# Patient Record
Sex: Female | Born: 1935
Health system: Southern US, Community
[De-identification: ages and names within clinical notes are randomized; demographics above are authoritative.]

## PROBLEM LIST (undated history)

## (undated) DIAGNOSIS — E785 Hyperlipidemia, unspecified: Secondary | ICD-10-CM

## (undated) DIAGNOSIS — K279 Peptic ulcer, site unspecified, unspecified as acute or chronic, without hemorrhage or perforation: Secondary | ICD-10-CM

## (undated) DIAGNOSIS — R911 Solitary pulmonary nodule: Secondary | ICD-10-CM

## (undated) DIAGNOSIS — I351 Nonrheumatic aortic (valve) insufficiency: Secondary | ICD-10-CM

## (undated) DIAGNOSIS — M25569 Pain in unspecified knee: Secondary | ICD-10-CM

## (undated) DIAGNOSIS — M810 Age-related osteoporosis without current pathological fracture: Secondary | ICD-10-CM

## (undated) DIAGNOSIS — R55 Syncope and collapse: Secondary | ICD-10-CM

## (undated) DIAGNOSIS — I2510 Atherosclerotic heart disease of native coronary artery without angina pectoris: Secondary | ICD-10-CM

## (undated) DIAGNOSIS — I340 Nonrheumatic mitral (valve) insufficiency: Secondary | ICD-10-CM

## (undated) DIAGNOSIS — S92009A Unspecified fracture of unspecified calcaneus, initial encounter for closed fracture: Secondary | ICD-10-CM

## (undated) DIAGNOSIS — I309 Acute pericarditis, unspecified: Principal | ICD-10-CM

## (undated) DIAGNOSIS — I480 Paroxysmal atrial fibrillation: Secondary | ICD-10-CM

## (undated) DIAGNOSIS — I071 Rheumatic tricuspid insufficiency: Secondary | ICD-10-CM

## (undated) DIAGNOSIS — I10 Essential (primary) hypertension: Secondary | ICD-10-CM

## (undated) DIAGNOSIS — K219 Gastro-esophageal reflux disease without esophagitis: Secondary | ICD-10-CM

## (undated) HISTORY — DX: Peptic ulcer, site unspecified, unspecified as acute or chronic, without hemorrhage or perforation: K27.9

## (undated) HISTORY — DX: Rheumatic tricuspid insufficiency: I07.1

## (undated) HISTORY — DX: Syncope and collapse: R55

## (undated) HISTORY — DX: Age-related osteoporosis without current pathological fracture: M81.0

## (undated) HISTORY — DX: Atherosclerotic heart disease of native coronary artery without angina pectoris: I25.10

## (undated) HISTORY — PX: OTHER SURGICAL HISTORY: SHX169

## (undated) HISTORY — DX: Paroxysmal atrial fibrillation: I48.0

## (undated) HISTORY — DX: Essential (primary) hypertension: I10

## (undated) HISTORY — DX: Pain in unspecified knee: M25.569

## (undated) HISTORY — PX: DILATION AND CURETTAGE OF UTERUS: SHX78

## (undated) HISTORY — DX: Solitary pulmonary nodule: R91.1

## (undated) HISTORY — DX: Gastro-esophageal reflux disease without esophagitis: K21.9

## (undated) HISTORY — PX: ORIF HIP FRACTURE: SHX2125

## (undated) HISTORY — DX: Acute pericarditis, unspecified: I30.9

## (undated) HISTORY — PX: CHOLECYSTECTOMY: SHX55

## (undated) HISTORY — PX: TONSILLECTOMY: SHX5217

## (undated) HISTORY — DX: Unspecified fracture of unspecified calcaneus, initial encounter for closed fracture: S92.009A

## (undated) HISTORY — DX: Hyperlipidemia, unspecified: E78.5

## (undated) HISTORY — DX: Nonrheumatic mitral (valve) insufficiency: I34.0

## (undated) HISTORY — DX: Nonrheumatic aortic (valve) insufficiency: I35.1

---

## 1998-01-20 LAB — HM MAMMOGRAPHY

## 2002-11-03 ENCOUNTER — Other Ambulatory Visit: Admission: RE | Admit: 2002-11-03 | Discharge: 2002-11-03 | Payer: Self-pay | Admitting: Internal Medicine

## 2002-12-09 LAB — HM COLONOSCOPY: HM Colonoscopy: NORMAL

## 2007-03-17 ENCOUNTER — Inpatient Hospital Stay (HOSPITAL_COMMUNITY): Admission: EM | Admit: 2007-03-17 | Discharge: 2007-03-24 | Payer: Self-pay | Admitting: Emergency Medicine

## 2007-03-17 ENCOUNTER — Ambulatory Visit: Payer: Self-pay | Admitting: Internal Medicine

## 2007-05-01 ENCOUNTER — Encounter: Payer: Self-pay | Admitting: Internal Medicine

## 2007-05-01 ENCOUNTER — Ambulatory Visit: Payer: Self-pay | Admitting: Internal Medicine

## 2007-05-02 DIAGNOSIS — M810 Age-related osteoporosis without current pathological fracture: Secondary | ICD-10-CM | POA: Insufficient documentation

## 2007-05-02 DIAGNOSIS — K279 Peptic ulcer, site unspecified, unspecified as acute or chronic, without hemorrhage or perforation: Secondary | ICD-10-CM | POA: Insufficient documentation

## 2007-05-02 DIAGNOSIS — I10 Essential (primary) hypertension: Secondary | ICD-10-CM | POA: Insufficient documentation

## 2007-05-02 HISTORY — DX: Peptic ulcer, site unspecified, unspecified as acute or chronic, without hemorrhage or perforation: K27.9

## 2007-05-02 HISTORY — DX: Age-related osteoporosis without current pathological fracture: M81.0

## 2007-05-08 ENCOUNTER — Telehealth: Payer: Self-pay | Admitting: Internal Medicine

## 2007-07-30 ENCOUNTER — Telehealth: Payer: Self-pay | Admitting: Internal Medicine

## 2007-08-12 ENCOUNTER — Telehealth: Payer: Self-pay | Admitting: Internal Medicine

## 2007-11-04 ENCOUNTER — Encounter: Payer: Self-pay | Admitting: *Deleted

## 2007-11-04 DIAGNOSIS — S92009A Unspecified fracture of unspecified calcaneus, initial encounter for closed fracture: Secondary | ICD-10-CM

## 2007-11-04 DIAGNOSIS — Z9089 Acquired absence of other organs: Secondary | ICD-10-CM | POA: Insufficient documentation

## 2007-11-04 DIAGNOSIS — Z8719 Personal history of other diseases of the digestive system: Secondary | ICD-10-CM | POA: Insufficient documentation

## 2007-11-04 DIAGNOSIS — Z9889 Other specified postprocedural states: Secondary | ICD-10-CM | POA: Insufficient documentation

## 2007-11-04 HISTORY — DX: Unspecified fracture of unspecified calcaneus, initial encounter for closed fracture: S92.009A

## 2007-11-05 ENCOUNTER — Ambulatory Visit: Payer: Self-pay | Admitting: Internal Medicine

## 2007-11-24 ENCOUNTER — Telehealth: Payer: Self-pay | Admitting: Internal Medicine

## 2008-11-04 ENCOUNTER — Telehealth: Payer: Self-pay | Admitting: Internal Medicine

## 2010-03-19 ENCOUNTER — Ambulatory Visit: Payer: Self-pay | Admitting: Internal Medicine

## 2010-03-19 ENCOUNTER — Encounter: Payer: Self-pay | Admitting: Internal Medicine

## 2010-03-19 DIAGNOSIS — M25569 Pain in unspecified knee: Secondary | ICD-10-CM | POA: Insufficient documentation

## 2010-03-19 HISTORY — DX: Pain in unspecified knee: M25.569

## 2010-03-19 LAB — CONVERTED CEMR LAB
BUN: 16 mg/dL (ref 6–23)
Basophils Absolute: 0 10*3/uL (ref 0.0–0.1)
Basophils Relative: 0.4 % (ref 0.0–3.0)
CO2: 30 meq/L (ref 19–32)
Calcium: 9.4 mg/dL (ref 8.4–10.5)
Chloride: 99 meq/L (ref 96–112)
Cholesterol: 222 mg/dL — ABNORMAL HIGH (ref 0–200)
Creatinine, Ser: 0.7 mg/dL (ref 0.4–1.2)
Direct LDL: 115.1 mg/dL
Eosinophils Absolute: 0.1 10*3/uL (ref 0.0–0.7)
Eosinophils Relative: 1 % (ref 0.0–5.0)
GFR calc non Af Amer: 87.88 mL/min (ref >60)
Glucose, Bld: 91 mg/dL (ref 70–99)
HCT: 39.5 % (ref 36.0–46.0)
HDL: 91.9 mg/dL (ref >39.00)
Hemoglobin: 13.4 g/dL (ref 12.0–15.0)
Lymphocytes Relative: 16.4 % (ref 12.0–46.0)
Lymphs Abs: 1 10*3/uL (ref 0.7–4.0)
MCHC: 34 g/dL (ref 30.0–36.0)
MCV: 91.8 fL (ref 78.0–100.0)
Monocytes Absolute: 0.3 10*3/uL (ref 0.1–1.0)
Monocytes Relative: 5.7 % (ref 3.0–12.0)
Neutro Abs: 4.6 10*3/uL (ref 1.4–7.7)
Neutrophils Relative %: 76.5 % (ref 43.0–77.0)
Platelets: 254 10*3/uL (ref 150.0–400.0)
Potassium: 4.3 meq/L (ref 3.5–5.1)
RBC: 4.31 M/uL (ref 3.87–5.11)
RDW: 14.6 % (ref 11.5–14.6)
Sodium: 137 meq/L (ref 135–145)
TSH: 3.42 microintl units/mL (ref 0.35–5.50)
Total CHOL/HDL Ratio: 2
Triglycerides: 38 mg/dL (ref 0.0–149.0)
VLDL: 7.6 mg/dL (ref 0.0–40.0)
WBC: 6 10*3/uL (ref 4.5–10.5)

## 2010-04-10 ENCOUNTER — Telehealth: Payer: Self-pay | Admitting: Internal Medicine

## 2010-07-31 NOTE — Progress Notes (Signed)
Summary: Prilosec/Rommie Dunn pt  Phone Note Call from Patient Call back at Home Phone [phone]   Caller: Patient Call For: Dr Debby Bud Summary of Call: Patient lmovm stating that she was instruced per Addeline Calarco to try Prilosec and to call back for RX if it worked. Is this ok to fill? Initial call taken by: Rock Nephew CMA,  August 12, 2007 3:01 PM  Follow-up for Phone Call        yes ok for rx for omeprazole 20 mg #30 one by mouth once daily ac rf#5 Follow-up by: D. Thomos Lemons DO,  August 12, 2007 7:19 PM    New/Updated Medications: OMEPRAZOLE 20 MG CPDR (OMEPRAZOLE) Take 1 once daily   Prescriptions: OMEPRAZOLE 20 MG CPDR (OMEPRAZOLE) Take 1 once daily  #30 x 5   Entered by:   Lamar Sprinkles   Authorized by:   D. Thomos Lemons DO   Signed by:   Lamar Sprinkles on 08/13/2007   Method used:   Electronically sent to ...       [facility] # [phone]*       [address]       [place], [place]  [address]       Ph: [phone]       Fax: [phone]   RxID:   [id number]

## 2010-07-31 NOTE — Progress Notes (Signed)
Summary: RF's  Phone Note Call from Patient   Summary of Call: Patient is requesting written rx's for all her meds to pick up. OK to prepare these?   - Also wants apt for shingles vaccine.  Initial call taken by: Lamar Sprinkles, CMA,  April 10, 2010 11:40 AM  Follow-up for Phone Call        OK to refill Rx's.   OK for shingles vaccine (if covered). Follow-up by: Jacques Navy MD,  April 10, 2010 1:02 PM  Additional Follow-up for Phone Call Additional follow up Details #1::        left mess to call office back...........Marland KitchenLamar Sprinkles, CMA  April 10, 2010 3:23 PM   Spoke w/patient, she will bring the form with needed meds to office tomorrow.   Also advised that she call her insurance and check on zostavax coverage.  Additional Follow-up by: Lamar Sprinkles, CMA,  April 12, 2010 5:42 PM    Additional Follow-up for Phone Call Additional follow up Details #2::    Spoke w/patient. She needs refills sent to pharmacy. Follow-up by: Lamar Sprinkles, CMA,  April 18, 2010 1:56 PM  Prescriptions: ALLEGRA 60 MG TABS (FEXOFENADINE HCL) 1 once daily  #90 x 3   Entered by:   Lamar Sprinkles, CMA   Authorized by:   Jacques Navy MD   Signed by:   Lamar Sprinkles, CMA on 04/18/2010   Method used:   Electronically to        [facility] # [phone]* (retail)       [address]       [place], [place]  [address]       Ph: [phone]       Fax: [phone]   RxID:   [id number] PROTONIX 40 MG  TBEC (PANTOPRAZOLE SODIUM) 1 once daily  #90 x 1   Entered by:   Lamar Sprinkles, CMA   Authorized by:   Jacques Navy MD   Signed by:   Lamar Sprinkles, CMA on 04/18/2010   Method used:   Electronically to        [facility] # [id number]* (retail)       [address]       [place], [place]  [address]       Ph: [phone]       Fax: [phone]   RxID:   [id number] LOPRESSOR 50 MG  TABS (METOPROLOL TARTRATE) 1/2 two times a day  #90 x 1   Entered by:   Lamar Sprinkles,  CMA   Authorized by:   Jacques Navy MD   Signed by:   Lamar Sprinkles, CMA on 04/18/2010   Method used:   Electronically to        [facility] # [phone]* (retail)       [address]       [place], [place]  [address]       Ph: [phone]       Fax: [phone]   RxID:   [id number]

## 2010-07-31 NOTE — Assessment & Plan Note (Signed)
Summary: FU /$50 Cynthia Vargas   Vital Signs:  Patient Profile:   77 Years Old Female Weight:      111 pounds Temp:     98.3 degrees F oral Pulse rate:   65 / minute BP sitting:   150 / 90  (left arm) Cuff size:   small  Vitals Entered By: Zackery Barefoot CMA (Nov 05, 2007 3:12 PM)                 PCP:  Heriberto Stmartin  Chief Complaint:  Follow up.  History of Present Illness: Patient fell off a counter in the fall sustaining fractures of the heel, leg and hip on the right. She had ORIF right hip. She had 1 month of in-patient rehab at [facility]. She has completed a course of out-patient rehab under the direction of Dr. Daneen Schick. She reports that she is doing well, ambulating with a cane when outside the home, especially on uneven ground. She has only mild pain for which she takes APAP as needed. She feels ready to return to work and other activities.    Updated Prior Medication List: LOPRESSOR 50 MG  TABS (METOPROLOL TARTRATE) 1/2 two times a day OXYCODONE HCL 5 MG  TABS (OXYCODONE HCL) 1 q6 as needed MELOXICAM 15 MG  TABS (MELOXICAM) once daily PAIN RELIEF 500 MG  TABS (ACETAMINOPHEN) 2 tabs three times a day ACTONEL 35 MG  TABS (RISEDRONATE SODIUM) q wk OMEPRAZOLE 20 MG CPDR (OMEPRAZOLE) Take 1 once daily  Current Allergies: No known allergies   Past Medical History:    Reviewed history from 11/04/2007 and no changes required:       IRRITABLE BOWEL SYNDROME, HX OF (ICD-V12.79)       CONSTIPATION, CHRONIC, HX OF (ICD-V12.79)       HIATAL HERNIA WITH REFLUX, HX OF (ICD-V12.79)       MUMPS, HX OF AS AN ADULT (ICD-V12.09)       UNSPECIFIED ESSENTIAL HYPERTENSION (ICD-401.9)       AFTERCARE FOR HEALING TRAUMATIC FRACTURE OF HIP (ICD-V54.13)       OSTEOPOROSIS (ICD-733.00)       PEPTIC ULCER DISEASE (ICD-533.90)                      Physician roster:            Delbert Phenix  Past Surgical History:    Reviewed history from 11/04/2007 and no changes required:  DILATION AND CURETTAGE, HX OF (ICD-V45.89)       Hx of CALCANEAL FRACTURE, RIGHT (ICD-825.0)       TONSILLECTOMY, HX OF (ICD-V45.79)       CHOLECYSTECTOMY, HX OF (ICD-V45.79)       * ORIF RIGHT HIP              G3P3 NSVD   Family History:    Reviewed history from 05/01/2007 and no changes required:       mother deceased -unkown cause       father - deceased cancer unknown type       Neg breast, colon cancer; neg CAD, DM  Social History:    Reviewed history from 05/01/2007 and no changes required:       [employer] degrees in Art and Education       married for 2 years then single        1 son - [place]       2 daughters-put up for adoption  Lives alone and is independent in ADL's       works at [employer] until injured    Review of Systems  The patient denies anorexia, weight loss, decreased hearing, hoarseness, chest pain, dyspnea on exhertion, peripheral edema, abdominal pain, severe indigestion/heartburn, incontinence, muscle weakness, difficulty walking, and depression.     Physical Exam  General:     Thin, well groomed woman looking younger than her age. Head:     Normocephalic and atraumatic without obvious abnormalities. No apparent alopecia or balding. Eyes:     pupils equal, pupils round, corneas and lenses clear, and no injection.   Neck:     No deformities, masses, or tenderness noted. Lungs:     Normal respiratory effort, chest expands symmetrically. Lungs are clear to auscultation, no crackles or wheezes. Heart:     Normal rate and regular rhythm. S1 and S2 normal without gallop, murmur, click, rub or other extra sounds. Msk:     no obvious deformityu right LE, normal ambulation with the use of a cane for balance only. Neurologic:     cranial nerves II-XII intact, strength normal in all extremities, and gait normal.   Psych:     Oriented X3, memory intact for recent and remote, normally interactive, and not anxious appearing.      Impression &  Recommendations:  Problem # 1:  Hx of CALCANEAL FRACTURE, RIGHT (ICD-825.0) Patient has made a good recovery from significant trauma to the right LE. She is cleared for all activities including a return to work. She is advised to continue with the single point cane for balance on eneven terrain. Return as needed.  Complete Medication List: 1)  Lopressor 50 Mg Tabs (Metoprolol tartrate) .... 1/2 two times a day 2)  Oxycodone Hcl 5 Mg Tabs (Oxycodone hcl) .[name].. 1 q6 as needed 3)  Meloxicam 15 Mg Tabs (Meloxicam) .... Once daily 4)  Pain Relief 500 Mg Tabs (Acetaminophen) .... 2 tabs three times a day 5)  Actonel 35 Mg Tabs (Risedronate sodium) .... Q wk 6)  Omeprazole 20 Mg Cpdr (Omeprazole) .... Take 1 once daily    ]

## 2010-07-31 NOTE — Progress Notes (Signed)
Summary: stomach irritation  Phone Note Call from Patient   Caller: Patient Summary of Call: Patient called lmovm stating that she is only taking metoprolol and meloxicam. She decided to d/c all other medication due to stomach irritation. Patient has tried otc Zantac 150mg  but it doesnt seen to help. She would like to know if you can recommend of give something stronger than zantac.  Initial call taken by: Rock Nephew CMA,  July 30, 2007 2:22 PM  Follow-up for Phone Call        try prilosec otc one or two in AM. If this helps we can provide a prescription for the same. Follow-up by: Jacques Navy MD,  July 30, 2007 5:23 PM  Additional Follow-up for Phone Call Additional follow up Details #1::        left mess to call office back ..................................................................Marland KitchenLamar Sprinkles  July 30, 2007 6:26 PM     Additional Follow-up for Phone Call Additional follow up Details #2::    Pt informed will call back if needs rx Follow-up by: Lamar Sprinkles,  July 31, 2007 8:47 AM

## 2010-07-31 NOTE — Progress Notes (Signed)
Summary: Please fill Pain med  Phone Note Refill Request Call back at Home Phone [phone]   Refills Requested: Medication #1:  OXYCODONE HCL 5 MG  TABS 1 q6 as needed Pt has called x4 today for refill    Follow-up for Phone Call        cannot call in oxycodone. Writen Rx done. Follow-up by: Jacques Navy MD,  May 08, 2007 10:26 AM  Additional Follow-up for Phone Call Additional follow up Details #1::        left mess to call office back  Additional Follow-up by: Lamar Sprinkles,  May 08, 2007 10:39 AM    Additional Follow-up for Phone Call Additional follow up Details #2::    Pt informed rx upfront Follow-up by: Lamar Sprinkles,  May 08, 2007 4:59 PM    Prescriptions: OXYCODONE HCL 5 MG  TABS (OXYCODONE HCL) 1 q6 as needed  #120 x 0   Entered by:   Lamar Sprinkles   Authorized by:   Jacques Navy MD   Signed by:   Lamar Sprinkles on 05/08/2007   Method used:   Handwritten   RxID:   [id number]

## 2010-07-31 NOTE — Progress Notes (Signed)
Summary: Allergy med  Phone Note Call from Patient   Summary of Call: Pt states that dr Debby Bud gave her rx for allergy med in past. She says she has tried all the otc's w/no help. Patient is requesting rx. Initial call taken by: Lamar Sprinkles,  Nov 04, 2008 5:49 PM  Follow-up for Phone Call        OK for fexofenedine 180mg  q d, # 30, refill prn Follow-up by: Jacques Navy MD,  Nov 06, 2008 3:08 PM  Additional Follow-up for Phone Call Additional follow up Details #1::        Lf mess for pt Additional Follow-up by: Lamar Sprinkles,  Nov 07, 2008 12:30 PM    New/Updated Medications: ALLEGRA 60 MG TABS (FEXOFENADINE HCL) 1 once daily   Prescriptions: ALLEGRA 60 MG TABS (FEXOFENADINE HCL) 1 once daily  #90 x 3   Entered by:   Lamar Sprinkles   Authorized by:   Jacques Navy MD   Signed by:   Lamar Sprinkles on 11/07/2008   Method used:   Electronically to        [facility] # [phone]* (retail)       [address]       [place], [place]  [address]       Ph: [phone]       Fax: [phone]   RxID:   [id number]

## 2010-07-31 NOTE — Assessment & Plan Note (Signed)
Summary: YEARLY FU/ CIRCULATION PROBLEMS X SEVERAL MO/ LABS LATER/NWS  #   Vital Signs:  Patient profile:   77 year old female Height:      59 inches Weight:      106 pounds BMI:     21.49 O2 Sat:      99 % on Room air Temp:     97.7 degrees F oral Pulse rate:   80 / minute BP sitting:   138 / 84  (left arm) Cuff size:   regular  Vitals Entered By: Bill Salinas CMA (March 19, 2010 1:33 PM)  O2 Flow:  Room air CC: pt here for annual appt/ ab Comments Pt is unsure of all vaccines dates. She states she has not had a flu shot in several years  Vision Screening:      Vision Comments: Pt had normal eye exam Aug 2011.   Primary Care Provider:  Shameika Speelman  CC:  pt here for annual appt/ ab.  History of Present Illness: Patient presents for medical follow-up.  she is having a lot of knee pain at the right knee. If she sits for 30 min + she then cannot move her leg. She is using a cane. she has seen Dr. Priscille Kluver, had x-rays and does not require any surgery at this time. She does take occasional APAP  She does c/o stiffness and immobility of the 4th 5th digit first think in the morning. No symptoms during the day.   She reports that she is otherwise doing well with no complaints.   Preventive Screening-Counseling & Management  Alcohol-Tobacco     Alcohol drinks/day: <1     Alcohol type: wine     >5/day in last 3 mos: yes     Smoking Status: never  Caffeine-Diet-Exercise     Caffeine use/day: 4 cups a day     Does Patient Exercise: yes     Type of exercise: walking     Exercise (avg: min/session): <30     Times/week: 7  Hep-HIV-STD-Contraception     Dental Visit-last 6 months yes     Sun Exposure-Excessive: no  Safety-Violence-Falls     Seat Belt Use: yes     Firearms in the Home: no firearms in the home     Smoke Detectors: no     Violence in the Home: no risk noted     Fall Risk: no falls in the past year      Drug Use:  current.        Blood Transfusions:  no.     Current Medications (verified): 1)  Lopressor 50 Mg  Tabs (Metoprolol Tartrate) .... 1/2 Two Times A Day 2)  Oxycodone Hcl 5 Mg  Tabs (Oxycodone Hcl) .[name].. 1 Q6 As Needed 3)  Meloxicam 15 Mg  Tabs (Meloxicam) .... Once Daily 4)  Pain Relief 500 Mg  Tabs (Acetaminophen) .... 2 Tabs Three Times A Day 5)  Actonel 35 Mg  Tabs (Risedronate Sodium) .... Q Wk 6)  Protonix 40 Mg  Tbec (Pantoprazole Sodium) .[name].. 1 Once Daily 7)  Allegra 60 Mg Tabs (Fexofenadine Hcl) .[name].. 1 Once Daily  Allergies (verified): No Known Drug Allergies  Past History:  Past Medical History: Last updated: 11/04/2007 IRRITABLE BOWEL SYNDROME, HX OF (ICD-V12.79) CONSTIPATION, CHRONIC, HX OF (ICD-V12.79) HIATAL HERNIA WITH REFLUX, HX OF (ICD-V12.79) MUMPS, HX OF AS AN ADULT (ICD-V12.09) UNSPECIFIED ESSENTIAL HYPERTENSION (ICD-401.9) AFTERCARE FOR HEALING TRAUMATIC FRACTURE OF HIP (ICD-V54.13) OSTEOPOROSIS (ICD-733.00) PEPTIC ULCER DISEASE (ICD-533.90)  Physician roster:      Delbert Phenix  Past Surgical History: Last updated: 11/04/2007 DILATION AND CURETTAGE, HX OF (ICD-V45.89) Hx of CALCANEAL FRACTURE, RIGHT (ICD-825.0) TONSILLECTOMY, HX OF (ICD-V45.79) CHOLECYSTECTOMY, HX OF (ICD-V45.79) * ORIF RIGHT HIP  G3P3 NSVD  Family History: Last updated: 05-07-2007 mother deceased -unkown cause father - deceased cancer unknown type Neg breast, colon cancer; neg CAD, DM  Social History: Last updated: May 07, 2007 [employer] degrees in Art and Education married for 2 years then single  1 son - [place] 2 daughters-put up for adoption Lives alone and is independent in ADL's works at [employer] until injured  Risk Factors: Alcohol Use: <1 (03/19/2010) >5 drinks/d w/in last 3 months: yes (03/19/2010) Caffeine Use: 4 cups a day (03/19/2010) Exercise: yes (03/19/2010)  Risk Factors: Smoking Status: never (03/19/2010)  Social History: Caffeine use/day:  4 cups a day Does Patient Exercise:   yes Dental Care w/in 6 mos.:  yes Sun Exposure-Excessive:  no Seat Belt Use:  yes Fall Risk:  no falls in the past year Drug Use:  current Blood Transfusions:  no  Review of Systems  The patient denies anorexia, fever, weight loss, weight gain, decreased hearing, hoarseness, chest pain, syncope, dyspnea on exertion, hemoptysis, abdominal pain, severe indigestion/heartburn, incontinence, suspicious skin lesions, difficulty walking, abnormal bleeding, enlarged lymph nodes, and breast masses.    Physical Exam  General:  Slender white female looking her stated age in no distress Head:  normocephalic and atraumatic.   Eyes:  vision grossly intact, pupils equal, pupils round, and corneas and lenses clear.   Ears:  External ear exam shows no significant lesions or deformities.  Otoscopic examination reveals clear canals, tympanic membranes are intact bilaterally without bulging, retraction, inflammation or discharge. Hearing is grossly normal bilaterally. Nose:  no external deformity and no external erythema.   Mouth:  Oral mucosa and oropharynx without lesions or exudates.  Teeth in good repair. Neck:  supple, full ROM, no thyromegaly, and no carotid bruits.   Chest Wall:  no deformities and no tenderness.   Breasts:  No mass, nodules, thickening, tenderness, bulging, retraction, inflamation, nipple discharge or skin changes noted.   Lungs:  Normal respiratory effort, chest expands symmetrically. Lungs are clear to auscultation, no crackles or wheezes. Heart:  Normal rate and regular rhythm. S1 and S2 normal without gallop, murmur, click, rub or other extra sounds. Abdomen:  soft, non-tender, normal bowel sounds, and no guarding.   Msk:  mild increased PIP/DIP joints both hands. Knees without deformity. Pulses:  2+ radial pulses Extremities:  No clubbing, cyanosis, edema, or deformity noted with normal full range of motion of all joints.   Neurologic:  alert & oriented X3, cranial nerves  II-XII intact, and DTRs symmetrical and normal.  Gain hindered by pain, favors right knee. Uses cane Skin:  turgor normal, color normal, and no suspicious lesions.   Cervical Nodes:  no anterior cervical adenopathy and no posterior cervical adenopathy.   Axillary Nodes:  no R axillary adenopathy and no L axillary adenopathy.   Psych:  Oriented X3, memory intact for recent and remote, normally interactive, good eye contact, and not anxious appearing.     Impression & Recommendations:  Problem # 1:  IRRITABLE BOWEL SYNDROME, HX OF (ICD-V12.79) stable with no recent flares  Problem # 2:  UNSPECIFIED ESSENTIAL HYPERTENSION (ICD-401.9)  Her updated medication list for this problem includes:    Lopressor 50 Mg Tabs (Metoprolol tartrate) .[name]... 1/2 two times a day  Orders: TLB-BMP (  Basic Metabolic Panel-BMET) (80048-METABOL)  BP today: 138/84 Prior BP: 150/90 (11/05/2007)  Adequate control at today's visit. Will continue present medications.  Problem # 3:  OSTEOPOROSIS (ICD-733.00) Patient has been on bisphosphonate therapy for many years.  Plan - will stop actonel           continue calcium and vit D on a daily basis.  Her updated medication list for this problem includes:    Actonel 35 Mg Tabs (Risedronate sodium) ..... Q wk  Problem # 4:  KNEE PAIN, RIGHT, CHRONIC (ICD-719.46) Patient with chronic knee pain. Reviewed x-ray images of knee - well preserved joint space and no deformity to my reading. She reports that her orhtopedist, Dr. Priscille Kluver, had sent her to physical therapy. She does have some limitation and is using a can.  Plan - OK to continue meds as listed with care for GI irritation by meloxicam           continue PT  Her updated medication list for this problem includes:    Oxycodone Hcl 5 Mg Tabs (Oxycodone hcl) .[name]... 1 q6 as needed    Meloxicam 15 Mg Tabs (Meloxicam) ..... Once daily    Pain Relief 500 Mg Tabs (Acetaminophen) .[name]... 2 tabs three times a day  Problem  # 5:  Preventive Health Care (ICD-V70.0) Complex health history but no recent major events. Physical exam is normal. Lab results are all within normal limits. She will be due for a mammogram at two year interval and had a normal breast exam today. She is not in need of pelvic exam. Last colonoscopy in '04. Immunizations - she believes she had pneumonia vac while in hospital or at [facility] - no documentation available. Given flu today.  She is independent in ADLs, she is not depressed and she manages all her own affairs - is cognitively in tact. She is counselled to continue a heart healthy diet, to continue with PT and to consider a mammogram. She is also counselled to have shingle vaccine.  In summary - a very nice woman who is medically stable. She will return as needed or in 1 year.   Orders: TLB-Lipid Panel (80061-LIPID) TLB-TSH (Thyroid Stimulating Hormone) (84443-TSH) [facility] -Subsequent Annual Wellness Visit [phone])  Complete Medication List: 1)  Lopressor 50 Mg Tabs (Metoprolol tartrate) .... 1/2 two times a day 2)  Oxycodone Hcl 5 Mg Tabs (Oxycodone hcl) .[name].. 1 q6 as needed 3)  Meloxicam 15 Mg Tabs (Meloxicam) .... Once daily 4)  Pain Relief 500 Mg Tabs (Acetaminophen) .... 2 tabs three times a day 5)  Actonel 35 Mg Tabs (Risedronate sodium) .... Q wk 6)  Protonix 40 Mg Tbec (Pantoprazole sodium) .[name].. 1 once daily 7)  Allegra 60 Mg Tabs (Fexofenadine hcl) .[name].. 1 once daily  Other Orders: TLB-CBC Platelet - w/Differential (85025-CBCD) Flu Vaccine 59yrs + MEDICARE PATIENTS (N6295) Administration Flu vaccine - MCR (M8413)   Patient: Cynthia Vargas Note: All result statuses are Final unless otherwise noted.  Tests: (1) BMP (METABOL)   Sodium                    137 mEq/L                   135-145   Potassium                 4.3 mEq/L                   3.5-5.1  Chloride                  99 mEq/L                    96-112   Carbon Dioxide            30 mEq/L                     19-32   Glucose                   91 mg/dL                    16-10   BUN                       16 mg/dL                    9-60   Creatinine                0.7 mg/dL                   4.5-4.0   Calcium                   9.4 mg/dL                   9.8-11.9   GFR                       87.88 mL/min                >60  Tests: (2) CBC Platelet w/Diff (CBCD)   White Cell Count          6.0 K/uL                    4.5-10.5   Red Cell Count            4.31 Mil/uL                 3.87-5.11   Hemoglobin                13.4 g/dL                   14.7-82.9   Hematocrit                39.5 %                      36.0-46.0   MCV                       91.8 fl                     78.0-100.0   MCHC                      34.0 g/dL                   56.2-13.0   RDW                       14.6 %                      11.5-14.6   Platelet Count  254.0 K/uL                  150.0-400.0   Neutrophil %              76.5 %                      43.0-77.0   Lymphocyte %              16.4 %                      12.0-46.0   Monocyte %                5.7 %                       3.0-12.0   Eosinophils%              1.0 %                       0.0-5.0   Basophils %               0.4 %                       0.0-3.0   Neutrophill Absolute      4.6 K/uL                    1.4-7.7   Lymphocyte Absolute       1.0 K/uL                    0.7-4.0   Monocyte Absolute         0.3 K/uL                    0.1-1.0  Eosinophils, Absolute                             0.1 K/uL                    0.0-0.7   Basophils Absolute        0.0 K/uL                    0.0-0.1  Tests: (3) Lipid Panel (LIPID)   Cholesterol          [H]  222 mg/dL                   8-119     ATP III Classification            Desirable:  < 200 mg/dL                    Borderline High:  200 - 239 mg/dL               High:  > = 240 mg/dL   Triglycerides             38.0 mg/dL                  1.4-782.9     Normal:  <150 mg/dL     Borderline High:   150 - 199 mg/dL   HDL  91.90 mg/dL                 >16.10   VLDL Cholesterol          7.6 mg/dL                   9.6-04.5  CHO/HDL Ratio:  CHD Risk                             2                    Men          Women     1/2 Average Risk     3.4          3.3     Average Risk          5.0          4.4     2X Average Risk          9.6          7.1     3X Average Risk          15.0          11.0                           Tests: (4) TSH (TSH)   FastTSH                   3.42 uIU/mL                 0.35-5.50  Tests: (5) Cholesterol LDL - Direct (DIRLDL)  Cholesterol LDL - Direct                             115.1 mg/dL     Flu Vaccine Consent Questions     Do you have a history of severe allergic reactions to this vaccine? no    Any prior history of allergic reactions to egg and/or gelatin? no    Do you have a sensitivity to the preservative Thimersol? no    Do you have a past history of Guillan-Barre Syndrome? no    Do you currently have an acute febrile illness? no    Have you ever had a severe reaction to latex? no    Vaccine information given and explained to patient? yes    Are you currently pregnant? no    Lot Number:[id number]   Exp Date:12/29/2010   Site Given  Left Deltoid IMflu

## 2010-11-13 NOTE — Discharge Summary (Signed)
[name], [name]            ACCOUNT NO.:  [id number]   MEDICAL RECORD NO.:  [id number]          PATIENT TYPE:  INP   LOCATION:  1536                         FACILITY:  [facility]   PHYSICIAN:  John L. Rendall, M.D.  DATE OF BIRTH:  07-30-1931   DATE OF ADMISSION:  03/17/2007  DATE OF DISCHARGE:  03/23/2007                               DISCHARGE SUMMARY   ADMITTING DIAGNOSES:  1. Intertrochanteric fracture, right hip.  2. Right calcaneus fracture.  3. Mumps as an adult.  4. Hiatal hernia reflux.  5. History of peptic ulcer disease.  6. Chronic constipation.  7. Hyponatremia.  8. Hypokalemia.   DISCHARGE DIAGNOSES:  1. Status post right hip compression screws.  2. Right calcaneus fracture.  3. Acute blood loss anemia secondary to surgery requiring blood      transfusions.  4. Postoperative nausea, resolved.  5. Postoperative constipation, resolved.  6. Iron-deficiency anemia.  7. History of mumps as an adult.  8. Hiatal hernia reflux.  9. Peptic ulcer disease.  10.Chronic constipation.  11.Hypokalemia, resolved.  12.Hyponatremia, resolved.   HISTORY OF PRESENT ILLNESS:  Cynthia Vargas is a 77 year old white female  who fell 3-4 feet off the counter at work landing onto her right leg and  side.  She was brought to [facility] ED, where x-rays revealed  minimally displaced right intertrochanteric hip fracture and right  calcaneus fracture.   SURGICAL PROCEDURE:  The patient was taken to the operating room on  March 18, 2007, underwent a compression screw of the right hip.  Surgeon:  Carlisle Beers. Rendall, M.D.  Anesthesia:  General.  Assistant:  __________ PA-C.  The patient tolerated the procedure well and returned  to recovery in good stable condition.   CONSULTATIONS:  The following consults were obtained while the patient  was hospitalized:  1. PT/OT.  2. Case management.  3. [employer].   HOSPITAL COURSE:  Post-op day #1, the patient was hypotensive,  otherwise  vital signs stable.  H&H 8.3 and 24.5.  The patient was crossed typed  and given 2 units red blood cells.  The patient was noted to be  hypotensive.  Nausea for which she was given Zofran.  Post-op day #2, the nausea resolved.  Pain was controlled with meds.  The patient was afebrile.  Blood pressure 122/76.  H&H 11.8 and 34.1.  The patient declined working with PT on March 20, 2007.  Post-op day #3, the patient not able to void.  The patient requested and  I&O cath.  No bowel movement.  Pain under control.  H&H stable at 11.5  and 33.4.  Vital signs remained stable.  The patient was afebrile.  The  patient had constipation.  Waiting rehab SNF bed when available.  Post-op day #4, the patient was waiting for SNF.  Post-op day #5, the patient was alert and oriented.  Pain under control.  Tolerating diet and bowel movement x2.  Afebrile.  Vital signs stable.  The patient's right hip wound was clean and dry.  The right lower leg  EHL/__________  intact.  Calf was nontender to palpation.  The  patient  at this point in time was felt to be stable for discharge to a skilled  nursing facility when bed available.   LABORATORY:  Routine labs on admission, CBC:  White count was 9,800,  hemoglobin 12.4, hematocrit 36.3, platelets 230,000.  Special hemoglobin  studies performed on March 19, 2007, her retic count is 0.9, RBCs  low at 2.99 IML/UL, retic __________  26.9 thousand per UL.  PT INR on  admission all values within normal limits.  Routine chemistries on  admission:  Sodium 130 - low, potassium 3.3 - low, chloride 9.7, bicarb  25, glucose high at 104, BUN is 12.4 and creatinine 0.67.  Anemia  studies dated March 19, 2007, iron was low at 17.  TIBC was low at  195.  Saturation was 19% low.  UIBC was 178.  B12 was 451 picograms per  milliliter.  Ferritin was 59 mg/ml.  Urinalysis on admission with trace  hemoglobin, trace ketones, otherwise negative.   X-RAYS:  Portable  chest x-ray from March 17, 2007, shows no active  pulmonary disease.  Right ankle, 2 views, dated March 17, 2007,  showed a calcaneal fracture, mortis intact.  Right femur, 2 views from  March 17, 2007, showed nondisplaced intertrochanteric fracture of  the right hip.  Right hip, 2 views from March 18, 2007, showed  fluoroscopic views of the right hip were submitted and the patient to be  status post dynamic hip screw and plate fixation with intertrochanteric  femur fracture.   EKG on admission, March 17, 2007, showed a normal sinus rhythm with  a heart rate of 95 beats per minute.  PR interval 124 milliseconds.  QRS  was 78.  P, R, T axes were 70, 75, 71.   MEDICATIONS:  On the floor,  1. Colace 100 mg p.o. b.i.d.  2. Arixtra 2.5 mg given nightly at 9 p.m., started on March 18, 2007 to be continued for a total of 10 days.  3. Iron 325 mg one p.o. b.i.d.  4. Senokot one tab p.o. q.h.s. p.r.n.  5. Lopressor 25 mg one p.o. b.i.d.  6. Vicodin 5/500, one p.o. q.4 h. p.r.n.  7. Tylenol 325-650, one p.o. q.4 h. p.r.n.  8. Robaxin 500 mg one p.o. q.6 h.   DISCHARGE INSTRUCTIONS:  1. __________  meds to be adjusted by the skilled nursing facility      physician.  2. Weightbearing.  The patient is touchdown weightbearing is      __________  on the right leg with walker.  3. Special instructions equalizer CAM walker to right foot.  4. Elevate right foot on __________  .  5. Diet:  Regular diet, advance as tolerated.  6. Followup:  The patient needs to follow up in the office 14 days      from surgery, approximately 10 days from discharge from [facility] and is to follow up with Dr. Priscille Kluver.  Please call [phone] for an appointment.  7. Wound care:  Keep right hip wound clean and dry, change dressing      daily, call office at [phone] if any signs of infection or foul-      smelling drainage.   CONDITION ON DISCHARGE:  The patient was  discharged to a skilled nursing  facility in good stable condition.      Richardean Canal, P.A.      John L. Rendall,  M.D.  Electronically Signed    GC/MEDQ  D:  03/23/2007  T:  03/23/2007  Job:  [id number]   cc:   Jonny Ruiz L. Rendall, M.D.  Fax: [phone]   Titus Dubin. Alwyn Ren, MD,FACP,FCCP  [phone]  [place] [address]

## 2010-11-13 NOTE — Op Note (Signed)
[name], Cynthia Vargas            ACCOUNT NO.:  [id number]   MEDICAL RECORD NO.:  [id number]          PATIENT TYPE:  INP   LOCATION:  1536                         FACILITY:  [facility]   PHYSICIAN:  John L. Rendall, M.D.  DATE OF BIRTH:  20-Jul-1931   DATE OF PROCEDURE:  03/18/2007  DATE OF DISCHARGE:                               OPERATIVE REPORT   PREOPERATIVE DIAGNOSIS:  Intertrochanteric fracture right hip with  displacement.   SURGICAL PROCEDURES:  Minor manipulation and sliding compression screw  fixation, right hip fracture.   POSTOPERATIVE DIAGNOSIS:  Intertrochanteric fracture right hip with  displacement.   SURGEON:  John L. Rendall, M.D.   ASSISTANTArlys John D. Petrarca, P.A.-C.   ANESTHESIA:  General.   PATHOLOGY:  The patient has a two-part fracture, intertrochanteric,  right hip, on the intertrochanteric line.  There is external rotation of  the leg in slight displacement.   PROCEDURE:  Under general anesthesia the leg is internally rotated and  the fracture is reduced.  C-arm pictures reveal satisfactory position.  At this point routine prep and drape was done with a hanging vertical  plastic field.  A 4-inch lateral incision was made below the greater  trochanter.  The IT band was split in the line of its fibers.  Vastus  lateralis was released posteriorly and a Bennett retractor was placed  over the femur.  Guide wires placed up the femoral neck into the femoral  head at 135 degrees angle.  Using the reamer, reaming was done to within  about 4 mm of the joint surface on the femoral head in the lower third  of the femoral head.  On the lateral view it is well centered in the  femoral head.  The hip screw was then inserted and appropriate placement  was checked with C-arm on AP and lateral views.  Four-hole side plate is  then attached using two locking screws and two regular screws.  Once  this was in place, the wound was irrigated with saline and closed with  #1  Vicryl, 2-0 Vicryl and skin clips.  The apparatus used is the newer  version of the Ace TK2.      John L. Rendall, M.D.  Electronically Signed     JLR/MEDQ  D:  03/18/2007  T:  03/18/2007  Job:  [id number]

## 2010-11-13 NOTE — Consult Note (Signed)
NAMECHALISE, PE NO.:  [id number]   MEDICAL RECORD NO.:  [id number]          PATIENT TYPE:  EMS   LOCATION:  ED                           FACILITY:  [facility]   PHYSICIAN:  Cynthia Dubin. Hopper, MD,FACP,FCCPDATE OF BIRTH:  Cynthia Vargas, Cynthia Vargas   DATE OF CONSULTATION:  03/17/2007  DATE OF DISCHARGE:                                 CONSULTATION   Cynthia Vargas is a delightful 77 year old white female who fell  approximately 3-4 feet from a counter at a local retail outlet,  sustaining minimally displaced right intratrochanteric hip fracture and  right calcaneus fracture.   Her past medical history is surprisingly benign.  She had a  cholecystectomy 20 years ago.  Remotely, she had tonsillectomy.  In her  30s she had mumps complicated apparently by pulmonary congestion   She is on no medications.  She will take over-the-counter medications  occasionally for arthritis in her hands.   She does not smoke; she drinks socially to a minimal degree   She has no known drug allergies.   The father had colon cancer.  Mother had coronary artery disease.   REVIEW OF SYSTEMS:  Was completed in toto and is negative.   She will have occasional pain in her hands for which she takes over-the-  counter medications.   She denies any significant upper respiratory tract or pulmonary symptoms  at this time.   She denies any chest pain, palpitations, shortness of breath, paroxysmal  nocturnal dyspnea or cyanosis.   Apparently Dr. Debby Bud saw her 4 years ago for some gastric condition;  she is unsure what medicines were prescribed.  She has no active GI  symptoms at this time.   She has no genitourinary symptoms.   She has had no changes in the joints such as redness or swelling.  She  has had no neurologic symptoms.   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   She is a delightful individual who in obvious pain; she  is oriented x3  and an excellent historian.   PHYSICAL EXAMINATION:  VITAL SIGNS:  Temperature is 97.5, pulse is 103,  respiratory rate 20, blood pressure 167/103.  NECK:  Thyroid is normal to palpation.  She has no carotid bruits.  CARDIOVASCULAR:  She has a grade 1/2 to 1 systolic murmur.  CHEST:  Clear with no increased work of breathing.  ABDOMEN:  Soft and without organomegaly or tenderness.  EXTREMITIES:  Feet are cool; the pedal pulses are present on the left.  The right foot is wrapped.  She does have some nail deformities.   The EKG reveals some nonspecific ST-T wave changes with slight  depression inferiorly.  She also was flattening of T waves in V3.  The  ST-T wave slight depression is present in the lateral V leads as well.   And at this time, there is no contraindication to the planned surgery;  she is in extremely good health for her age.   She is hypertensive and mildly tachycardiac.  Metoprolol will be  recommended if  the blood pressure fails to improve with adequate pain  control.      Cynthia Dubin. Alwyn Ren, MD,FACP,FCCP  Electronically Signed     WFH/MEDQ  D:  03/17/2007  T:  03/18/2007  Job:  [id number]   cc:   Jonny Ruiz L. Rendall, M.D.  Fax: [phone]   Rosalyn Gess. Norins, MD  [address]  [place]  [place] [address]

## 2010-11-16 NOTE — Assessment & Plan Note (Signed)
[facility]                                 ON-CALL NOTE   NAME:FRYMANMarlean, [name]                     MRN:          [id number]  DATE:07/30/2007                            DOB:          1932/04/25    PRIMARY CARE PHYSICIAN:  Rosalyn Gess. Norins, M.D.   [name] calls in, stating that she got a message on her voice mail  from Dr. Debby Bud' office.  She was wondering if we knew what the call was  all about.  I advised patient to call the office tomorrow to get further  information on that phone call.  Patient expressed understanding and  will call the office tomorrow.     Leanne Chang, M.D.  Electronically Signed    LA/MedQ  DD: 07/30/2007  DT: 07/31/2007  Job #: [id number]   cc:   Rosalyn Gess. Norins, MD

## 2011-01-24 ENCOUNTER — Encounter: Payer: Self-pay | Admitting: Internal Medicine

## 2011-01-24 DIAGNOSIS — Z8719 Personal history of other diseases of the digestive system: Secondary | ICD-10-CM

## 2011-01-25 ENCOUNTER — Encounter: Payer: Self-pay | Admitting: Internal Medicine

## 2011-01-25 ENCOUNTER — Ambulatory Visit (INDEPENDENT_AMBULATORY_CARE_PROVIDER_SITE_OTHER): Payer: Medicare Other | Admitting: Internal Medicine

## 2011-01-25 DIAGNOSIS — R21 Rash and other nonspecific skin eruption: Secondary | ICD-10-CM

## 2011-01-25 NOTE — Patient Instructions (Signed)
Rash on wrist and left thigh does not look like contact dermatitis from poison ivey or oak. It has the look of an eczema. Plan - try using over the coutner 1% cortisone cream applied to the rash three times a day. If this doesn't seem to be working well enough call Monday and a Rx for a stronger topical steroid cream will be called in.   For itchy eyes and allergy type symptoms you may want to retry allegra 60 mg twice a day, available over the counter without prescription.

## 2011-01-27 ENCOUNTER — Encounter: Payer: Self-pay | Admitting: Internal Medicine

## 2011-01-27 DIAGNOSIS — R21 Rash and other nonspecific skin eruption: Secondary | ICD-10-CM | POA: Insufficient documentation

## 2011-01-27 NOTE — Assessment & Plan Note (Signed)
Rash that is not c/w contact allergy, does not appear to be zoster and is c/w eczematous outbreak.  Plan - trial of otc cortisone 1% cream. If no results she will call for a more potent topical steroid.

## 2011-01-27 NOTE — Progress Notes (Signed)
  Subjective:    Patient ID: Cynthia Vargas, female    DOB: 12/10/1931, 77 y.o.   MRN: [id number]  HPI Cynthia Vargas is concerned about rash: she has a flat macular, dark erythemaous rash left wrist, erythematous rash right forearm and a erythematous macular rash on the proxial left LE, lateral aspect. No report of pain and minimal pruritis.  In the interval since her last visit, Sept '11, she has been doing well. She reports that she has had mammography.  Past Medical History  Diagnosis Date  . CALCANEAL FRACTURE, RIGHT 11/04/2007  . DILATION AND CURETTAGE, HX OF 11/04/2007  . KNEE PAIN, RIGHT, CHRONIC 03/19/2010  . OSTEOPOROSIS 05/02/2007  . PEPTIC ULCER DISEASE 05/02/2007  . Personal History of Other Diseases of Digestive Disease 11/04/2007  . Unspecified essential hypertension 05/02/2007   Past Surgical History  Procedure Date  . Dilation and curettage of uterus   . Cholecystectomy   . Tonsillectomy   . Calcaneal fracture, right   . Orif hip fracture    Family History  Problem Relation Age of Onset  . Cancer Father    History   Social History  . Marital Status: Single    Spouse Name: N/A    Number of Children: N/A  . Years of Education: N/A   Occupational History  . Not on file.   Social History Main Topics  . Smoking status: Never Smoker   . Smokeless tobacco: Never Used  . Alcohol Use: No  . Drug Use: No  . Sexually Active: No   Other Topics Concern  . Not on file   Social History Narrative   [employer] degrees in Ambulance person. married for 2 years then single . 1 son - Lovie Chol [place]. 2 daughters-put up for adoption. Lives alone and is independent in ADL's. works at [employer] until injured       Review of Systems Neg for constitutional, pulmonary, cardiac, GI or psych issues    Objective:   Physical Exam Vitals noted. Gen'l - WNWD white woman in no distress HEENT - C&S clear Pulmonary - normal respirations Cor - RRR  Derm - dusky macular rash left wrist;  area lateral left thigh of erythema macular  Rash without vesicles.       Assessment & Plan:   No problem-specific assessment & plan notes found for this encounter.

## 2011-01-29 ENCOUNTER — Telehealth: Payer: Self-pay | Admitting: *Deleted

## 2011-01-29 MED ORDER — BETAMETHASONE DIPROPIONATE 0.05 % EX CREA: TOPICAL_CREAM | Freq: Two times a day (BID) | CUTANEOUS | Status: DC

## 2011-01-29 NOTE — Telephone Encounter (Signed)
lmovm for pt to call back 

## 2011-01-29 NOTE — Telephone Encounter (Signed)
Betamethasone0.05% cream 30 g apply bid to rash, 1 refill

## 2011-01-29 NOTE — Telephone Encounter (Signed)
Patient requesting RX for stronger cream for her rash, OTC has not helped.

## 2011-01-31 ENCOUNTER — Telehealth: Payer: Self-pay | Admitting: *Deleted

## 2011-01-31 NOTE — Telephone Encounter (Signed)
See other phone note, pt aware of rx for cream

## 2011-04-11 LAB — CBC
HCT: 24.5 — ABNORMAL LOW
HCT: 33.4 — ABNORMAL LOW
HCT: 34.1 — ABNORMAL LOW
HCT: 36.3
Hemoglobin: 11.5 — ABNORMAL LOW
Hemoglobin: 11.8 — ABNORMAL LOW
Hemoglobin: 12.4
Hemoglobin: 8.3 — ABNORMAL LOW
MCHC: 33.9
MCHC: 34.2
MCHC: 34.4
MCHC: 34.5
MCV: 86.7
MCV: 86.9
MCV: 89.1
MCV: 90.3
Platelets: 145 — ABNORMAL LOW
Platelets: 173
Platelets: 176
Platelets: 230
RBC: 2.71 — ABNORMAL LOW
RBC: 3.84 — ABNORMAL LOW
RBC: 3.93
RBC: 4.08
RDW: 14
RDW: 14.2 — ABNORMAL HIGH
RDW: 15.6 — ABNORMAL HIGH
RDW: 15.6 — ABNORMAL HIGH
WBC: 4.5
WBC: 5.5
WBC: 6.3
WBC: 9.8

## 2011-04-11 LAB — BASIC METABOLIC PANEL
BUN: 4 — ABNORMAL LOW
BUN: 8
CO2: 29
CO2: 31
Calcium: 8 — ABNORMAL LOW
Calcium: 8.2 — ABNORMAL LOW
Chloride: 102
Chloride: 103
Creatinine, Ser: 0.68
Creatinine, Ser: 0.73
GFR calc Af Amer: >60
GFR calc Af Amer: >60
GFR calc non Af Amer: >60
GFR calc non Af Amer: >60
Glucose, Bld: 100 — ABNORMAL HIGH
Glucose, Bld: 98
Potassium: 4
Potassium: 4.1
Sodium: 135
Sodium: 137

## 2011-04-11 LAB — BASIC METABOLIC PANEL WITH GFR
BUN: 14
BUN: 2 — ABNORMAL LOW
CO2: 25
CO2: 28
Calcium: 7.6 — ABNORMAL LOW
Calcium: 8.3 — ABNORMAL LOW
Chloride: 104
Chloride: 97
Creatinine, Ser: 0.67
Creatinine, Ser: 0.7
GFR calc non Af Amer: >60
GFR calc non Af Amer: >60
Glucose, Bld: 104 — ABNORMAL HIGH
Glucose, Bld: 112 — ABNORMAL HIGH
Potassium: 3.3 — ABNORMAL LOW
Potassium: 4.2
Sodium: 130 — ABNORMAL LOW
Sodium: 136

## 2011-04-11 LAB — RETICULOCYTES
RBC.: 2.99 — ABNORMAL LOW
Retic Count, Absolute: 26.9
Retic Ct Pct: 0.9

## 2011-04-11 LAB — URINE MICROSCOPIC-ADD ON

## 2011-04-11 LAB — TYPE AND SCREEN
ABO/RH(D): A POS
Antibody Screen: NEGATIVE

## 2011-04-11 LAB — URINALYSIS, ROUTINE W REFLEX MICROSCOPIC
Bilirubin Urine: NEGATIVE
Glucose, UA: NEGATIVE
Leukocytes, UA: NEGATIVE
Nitrite: NEGATIVE
Protein, ur: NEGATIVE
Specific Gravity, Urine: 1.01
Urobilinogen, UA: 0.2
pH: 7

## 2011-04-11 LAB — DIFFERENTIAL
Basophils Absolute: 0
Basophils Relative: 0
Eosinophils Absolute: 0
Eosinophils Relative: 0
Lymphocytes Relative: 5 — ABNORMAL LOW
Lymphs Abs: 0.5 — ABNORMAL LOW
Monocytes Absolute: 0.4
Monocytes Relative: 4
Neutro Abs: 8.9 — ABNORMAL HIGH
Neutrophils Relative %: 91 — ABNORMAL HIGH

## 2011-04-11 LAB — COMPREHENSIVE METABOLIC PANEL
ALT: 25
AST: 31
Albumin: 3.7
Alkaline Phosphatase: 40
BUN: 13
CO2: 24
Calcium: 8.2 — ABNORMAL LOW
Chloride: 97
Creatinine, Ser: 0.71
GFR calc Af Amer: >60
GFR calc non Af Amer: >60
Glucose, Bld: 140 — ABNORMAL HIGH
Potassium: 3.2 — ABNORMAL LOW
Sodium: 129 — ABNORMAL LOW
Total Bilirubin: 0.8
Total Protein: 6.4

## 2011-04-11 LAB — FERRITIN: Ferritin: 59 (ref 10–291)

## 2011-04-11 LAB — VITAMIN B12: Vitamin B-12: 451 (ref 211–911)

## 2011-04-11 LAB — IRON AND TIBC
Iron: 17 — ABNORMAL LOW
Saturation Ratios: 9 — ABNORMAL LOW
TIBC: 195 — ABNORMAL LOW
UIBC: 178

## 2011-04-11 LAB — ABO/RH: ABO/RH(D): A POS

## 2011-04-11 LAB — PROTIME-INR
INR: 1
Prothrombin Time: 13.4

## 2011-04-11 LAB — APTT: aPTT: 34

## 2011-06-28 ENCOUNTER — Encounter: Payer: Self-pay | Admitting: Internal Medicine

## 2011-06-28 ENCOUNTER — Ambulatory Visit (INDEPENDENT_AMBULATORY_CARE_PROVIDER_SITE_OTHER): Payer: Medicare Other | Admitting: Internal Medicine

## 2011-06-28 VITALS — BP 130/88 | HR 77 | Temp 98.3°F

## 2011-06-28 DIAGNOSIS — T783XXA Angioneurotic edema, initial encounter: Secondary | ICD-10-CM

## 2011-06-28 DIAGNOSIS — R7309 Other abnormal glucose: Secondary | ICD-10-CM

## 2011-06-28 DIAGNOSIS — R739 Hyperglycemia, unspecified: Secondary | ICD-10-CM

## 2011-06-28 DIAGNOSIS — I10 Essential (primary) hypertension: Secondary | ICD-10-CM

## 2011-06-28 DIAGNOSIS — J309 Allergic rhinitis, unspecified: Secondary | ICD-10-CM

## 2011-06-28 LAB — GLUCOSE, POCT (MANUAL RESULT ENTRY): POC Glucose: 84

## 2011-06-28 MED ORDER — METHYLPREDNISOLONE ACETATE PF 80 MG/ML IJ SUSP
120.0000 mg | Freq: Once | INTRAMUSCULAR | Status: AC
  Administered 2011-06-28: 120 mg via INTRAMUSCULAR

## 2011-06-28 MED ORDER — PREDNISONE 10 MG PO TABS: ORAL_TABLET | ORAL | Status: DC

## 2011-06-28 NOTE — Assessment & Plan Note (Addendum)
Mod to severe about both eyes, etiology unclear except ? Due to peanut allergy - for CBG to r/o elev glc, then for depomedrol IM , predpack asd, benadryl prn, and f/u with PCP

## 2011-06-28 NOTE — Patient Instructions (Addendum)
Your blood sugar today was: 84 Please avoid Peanuts in the future as you normally do You had the steroid shot today Take all new medications as prescribed - the prednisone You can also use Benadryl OTC 50 mg every 6 hours for itch and swelling as well You can also use allegra OTC for allergies as needed Please see Dr Debby Bud in the next month for a regular followup visit

## 2011-06-30 ENCOUNTER — Encounter: Payer: Self-pay | Admitting: Internal Medicine

## 2011-06-30 DIAGNOSIS — J309 Allergic rhinitis, unspecified: Secondary | ICD-10-CM | POA: Insufficient documentation

## 2011-06-30 NOTE — Assessment & Plan Note (Signed)
stable overall by hx and exam, most recent data reviewed with pt, and pt to continue medical treatment as before  BP Readings from Last 3 Encounters:  06/28/11 130/88  01/25/11 96/86  03/19/10 138/84

## 2011-06-30 NOTE — Progress Notes (Signed)
Subjective:    Patient ID: Cynthia Vargas, female    DOB: 1932/02/03, 77 y.o.   MRN: [id number]  HPI  Here with 2 days onset painless but itchy pressure like diffuse periorbital swelling, without fever, trauma, sinus symptoms such as pain or congestion, ST, cough, tongue swelling, rash and Pt denies chest pain, increased sob or doe, wheezing, orthopnea, PND, increased LE swelling, palpitations, dizziness or syncope.  Pt denies new neurological symptoms such as new headache, or facial or extremity weakness or numbness   Pt denies polydipsia, polyuria.  No prior hx of DM.  No current med use at all, last seen per PCP approx 2 yrs and stopped all of her meds.  Does have known peanut allergy, and did eat a cookie just prior to onset of symptoms that she was told did not have peanut but tasted like it and on a plate with peanut other foods as well. Past Medical History  Diagnosis Date  . CALCANEAL FRACTURE, RIGHT 11/04/2007  . DILATION AND CURETTAGE, HX OF 11/04/2007  . KNEE PAIN, RIGHT, CHRONIC 03/19/2010  . OSTEOPOROSIS 05/02/2007  . PEPTIC ULCER DISEASE 05/02/2007  . Personal History of Other Diseases of Digestive Disease 11/04/2007  . Unspecified essential hypertension 05/02/2007   Past Surgical History  Procedure Date  . Dilation and curettage of uterus   . Cholecystectomy   . Tonsillectomy   . Calcaneal fracture, right   . Orif hip fracture     reports that she has never smoked. She has never used smokeless tobacco. She reports that she does not drink alcohol or use illicit drugs. family history includes Cancer in her father. Allergies  Allergen Reactions  . Peanut-Containing Drug Products Other (See Comments)    angioedema   Current Outpatient Prescriptions on File Prior to Visit  Medication Sig Dispense Refill  . acetaminophen (TYLENOL) 500 MG tablet Take 1,000 mg by mouth as needed.       . betamethasone dipropionate (DIPROLENE) 0.05 % cream Apply topically 2 (two) times daily.  30 g   1  . fexofenadine (ALLEGRA) 60 MG tablet Take 60 mg by mouth daily.        . meloxicam (MOBIC) 15 MG tablet Take 15 mg by mouth daily.        . metoprolol (LOPRESSOR) 50 MG tablet Take 25 mg by mouth 2 (two) times daily.        [name] oxycodone (OXY-IR) 5 MG capsule Take 5 mg by mouth every 6 (six) hours as needed.        . pantoprazole (PROTONIX) 40 MG tablet Take 40 mg by mouth daily.        . risedronate (ACTONEL) 35 MG tablet Take 35 mg by mouth every 7 (seven) days. with water on empty stomach, nothing by mouth or lie down for next 30 minutes.        Review of Systems Review of Systems  Constitutional: Negative for diaphoresis and unexpected weight change.  HENT: Negative for drooling and tinnitus.   Eyes: Negative for photophobia and visual disturbance.  Respiratory: Negative for choking and stridor.   Gastrointestinal: Negative for vomiting and blood in stool.  Genitourinary: Negative for hematuria and decreased urine volume.     Objective:   Physical Exam BP 130/88  Pulse 77  Temp(Src) 98.3 F (36.8 C) (Oral)  SpO2 98% Physical Exam  VS noted, not ill appearing but uncomfortable, mild nervous Constitutional: Pt appears well-developed and well-nourished.  HENT: Head: Normocephalic.  Right Ear:  External ear normal.  Left Ear: External ear normal.  Large 2+ periorbital nontender soft tissue swelling without erythema or contusion\Mouth: no tongue or pharyngeal swelling Eyes: Conjunctivae and EOM are normal. Pupils are equal, round, and reactive to light.  Neck: Normal range of motion. Neck supple.  Cardiovascular: Normal rate and regular rhythm.   Pulmonary/Chest: Effort normal and breath sounds normal.  Neurological: Pt is alert. No cranial nerve deficit.   Assessment & Plan:

## 2011-06-30 NOTE — Assessment & Plan Note (Signed)
Currently asympt, but after using benadryl for acute symtpoms, can return to allegra prn otc

## 2011-07-29 ENCOUNTER — Encounter: Payer: Self-pay | Admitting: Internal Medicine

## 2011-07-29 ENCOUNTER — Ambulatory Visit (INDEPENDENT_AMBULATORY_CARE_PROVIDER_SITE_OTHER): Payer: Medicare Other | Admitting: Internal Medicine

## 2011-07-29 DIAGNOSIS — R0789 Other chest pain: Secondary | ICD-10-CM

## 2011-07-29 DIAGNOSIS — T7840XA Allergy, unspecified, initial encounter: Secondary | ICD-10-CM

## 2011-07-29 NOTE — Progress Notes (Signed)
  Subjective:    Patient ID: Cynthia Vargas, female    DOB: 14-Jul-1931, 78 y.o.   MRN: [id number]  HPI Cynthia Vargas was seen Dec 28th by Dr. Jonny Ruiz for angioedema. She was treated with steroid injection. The trigger may have been a cookie with a peanut product in it, to which she has a known allergy. She has not had any recurrence.  She reports an episode about 10 days ago of being awakened from sleep by a heavy chest pressure severe, no SOB but she may have had mild diaphoresis. The discomfort lasted for about an hour. She did get up to shower and had to return to bed for continued chest pressure. She has not had any recurrence, including no discomfort with walking. She is taking no medications. She has a low cardiac risk profile: underweight, no HTN, no high cholesterol, no DM, no smoking, active. Her mother did have a heart condition.   Past Medical History  Diagnosis Date  . CALCANEAL FRACTURE, RIGHT 11/04/2007  . DILATION AND CURETTAGE, HX OF 11/04/2007  . KNEE PAIN, RIGHT, CHRONIC 03/19/2010  . OSTEOPOROSIS 05/02/2007  . PEPTIC ULCER DISEASE 05/02/2007  . Personal History of Other Diseases of Digestive Disease 11/04/2007  . Uncontrolled hypertension as indication for native nephrectomy 05/02/2007   Past Surgical History  Procedure Date  . Dilation and curettage of uterus   . Cholecystectomy   . Tonsillectomy   . Calcaneal fracture, right   . Orif hip fracture    Family History  Problem Relation Age of Onset  . Cancer Father    History   Social History  . Marital Status: Single    Spouse Name: N/A    Number of Children: N/A  . Years of Education: N/A   Occupational History  . Not on file.   Social History Main Topics  . Smoking status: Never Smoker   . Smokeless tobacco: Never Used  . Alcohol Use: No  . Drug Use: No  . Sexually Active: No   Other Topics Concern  . Not on file   Social History Narrative   [employer] degrees in Ambulance person. married for 2 years then  single . 1 son - Lovie Chol [place]. 2 daughters-put up for adoption. Lives alone and is independent in ADL's. works at [employer] until injured      Review of Systems System review is negative for any constitutional, cardiac, pulmonary, GI or neuro symptoms or complaints other than as described in the HPI.     Objective:   Physical Exam Filed Vitals:   07/29/11 1424  BP: 126/84  Pulse: 74  Temp: 97.6 F (36.4 C)  Resp: 14   Gen'l- thin older woman in no distress HEENT - C&S clear Pulm - normal respirations Cor- 2+ radial, RRR Neuro - A&O x 3, gait - minimal reliance on cane for balance Derm- no swelling or abnormality       Assessment & Plan:  Allergic reaction - total resolution of angioedema. Suspect peanut reaction. Plan- avoidance  Atypical chest pain - patient with single episode of discomfort, low risk, no exertional component. Plan - offered diagnostic eval which she declined at this time.           She is to call for any recurrent chest pain.

## 2011-07-29 NOTE — Patient Instructions (Signed)
Allergic reaction with swelling about the eyes seems to have completely resolved. For mild allergy symptoms it is fine to take over the counter allegra.  Episode of chest pain - worrisome but you have a low cardiac risk profile and have had no recurrent symptoms including being able to walk without pain. Plan - close observation: for any recurrent chest pain or decreased exercise tolerance (can't get around the block with the dog) you will need immediate attention.

## 2012-07-30 ENCOUNTER — Telehealth: Payer: Self-pay | Admitting: *Deleted

## 2012-07-30 DIAGNOSIS — T887XXA Unspecified adverse effect of drug or medicament, initial encounter: Secondary | ICD-10-CM

## 2012-07-30 NOTE — Telephone Encounter (Signed)
Pt states that Dr. Tamala Bari her orthopedic specialist would like [place] to monitor her bloodwork since she is taking Meloxicam 7.5g.

## 2012-07-30 NOTE — Telephone Encounter (Signed)
Come by for lab - order in system

## 2012-07-31 NOTE — Telephone Encounter (Signed)
Left message for pt to callback office.  

## 2012-08-03 ENCOUNTER — Encounter: Payer: Self-pay | Admitting: Internal Medicine

## 2012-08-03 ENCOUNTER — Ambulatory Visit (INDEPENDENT_AMBULATORY_CARE_PROVIDER_SITE_OTHER): Payer: Medicare Other | Admitting: Internal Medicine

## 2012-08-03 ENCOUNTER — Other Ambulatory Visit (INDEPENDENT_AMBULATORY_CARE_PROVIDER_SITE_OTHER): Payer: Medicare Other

## 2012-08-03 VITALS — BP 122/72 | HR 72 | Temp 98.1°F | Resp 10 | Wt 106.1 lb

## 2012-08-03 DIAGNOSIS — I10 Essential (primary) hypertension: Secondary | ICD-10-CM

## 2012-08-03 DIAGNOSIS — Z23 Encounter for immunization: Secondary | ICD-10-CM

## 2012-08-03 DIAGNOSIS — K279 Peptic ulcer, site unspecified, unspecified as acute or chronic, without hemorrhage or perforation: Secondary | ICD-10-CM

## 2012-08-03 DIAGNOSIS — M25569 Pain in unspecified knee: Secondary | ICD-10-CM

## 2012-08-03 LAB — COMPREHENSIVE METABOLIC PANEL
ALT: 14 U/L (ref 0–35)
AST: 26 U/L (ref 0–37)
Albumin: 4.2 g/dL (ref 3.5–5.2)
Alkaline Phosphatase: 47 U/L (ref 39–117)
BUN: 14 mg/dL (ref 6–23)
CO2: 29 mEq/L (ref 19–32)
Calcium: 9 mg/dL (ref 8.4–10.5)
Chloride: 102 mEq/L (ref 96–112)
Creatinine, Ser: 0.8 mg/dL (ref 0.4–1.2)
GFR: 73.63 mL/min (ref >60.00)
Glucose, Bld: 96 mg/dL (ref 70–99)
Potassium: 4.3 mEq/L (ref 3.5–5.1)
Sodium: 139 mEq/L (ref 135–145)
Total Bilirubin: 0.6 mg/dL (ref 0.3–1.2)
Total Protein: 6.7 g/dL (ref 6.0–8.3)

## 2012-08-03 NOTE — Patient Instructions (Addendum)
Thanks for coming in to see me.  Best I can tell you are doing well. Reviewed labs from 2011 which were all good - no need for additional lab at this time.  Limited exam is normal.  Lab - will check renal function and electrolytes. You will receive a letter with results. Also, a copy will be sent to Dr. Madelon Lips.   Immunizations: flu today and tetnus.  Come back when I can be of service.

## 2012-08-03 NOTE — Progress Notes (Signed)
Subjective:    Patient ID: Cynthia Vargas, female    DOB: 03/19/1932, 79 y.o.   MRN: [id number]  HPI Cynthia Vargas presents for follow up and a need for lab work due to chronic use of NSAIDs. She has in general been doing well.  She has no complaints and has been feeling well.  Past Medical History  Diagnosis Date  . CALCANEAL FRACTURE, RIGHT 11/04/2007  . DILATION AND CURETTAGE, HX OF 11/04/2007  . KNEE PAIN, RIGHT, CHRONIC 03/19/2010  . OSTEOPOROSIS 05/02/2007  . PEPTIC ULCER DISEASE 05/02/2007  . Personal History of Other Diseases of Digestive Disease 11/04/2007  . Unspecified essential hypertension 05/02/2007   Past Surgical History  Procedure Date  . Dilation and curettage of uterus   . Cholecystectomy   . Tonsillectomy   . Calcaneal fracture, right   . Orif hip fracture    Family History  Problem Relation Age of Onset  . Cancer Father   . Heart disease Mother   . Diabetes Neg Hx   . Hyperlipidemia Neg Hx   . Hypertension Neg Hx    History   Social History  . Marital Status: Single    Spouse Name: N/A    Number of Children: 0  . Years of Education: 16   Occupational History  . reitred    Social History Main Topics  . Smoking status: Never Smoker   . Smokeless tobacco: Never Used  . Alcohol Use: No  . Drug Use: No  . Sexually Active: No   Other Topics Concern  . Not on file   Social History Narrative   [employer] degrees in Ambulance person. married for 2 years then single . 1 son - Cynthia Vargas. 2 daughters-put up for adoption. Lives alone and is independent in ADL's. works at [employer] until injured.    Current Outpatient Prescriptions on File Prior to Visit  Medication Sig Dispense Refill  . acetaminophen (TYLENOL) 500 MG tablet Take 1,000 mg by mouth as needed.       . fexofenadine (ALLEGRA) 60 MG tablet Take 60 mg by mouth daily.            Review of Systems System review is negative for any constitutional, cardiac, pulmonary, GI or neuro symptoms or  complaints other than as described in the HPI.     Objective:   Physical Exam Filed Vitals:   08/03/12 1320  BP: 122/72  Pulse: 72  Temp: 98.1 F (36.7 C)  Resp: 10   Wt Readings from Last 3 Encounters:  08/03/12 106 lb 1.9 oz (48.136 kg)  07/29/11 106 lb 8 oz (48.308 kg)  01/25/11 108 lb (48.988 kg)   Gen'l- Slender white woman in no distress HEENT - C&S clear, PERRLA Cor- 2+ radial pulse, RRR, No JVD Pulm - normal respirations. Abd- soft, BS+ Neuro - alert and oriented. Using single point cane due to right knee damage and pain.   Lab Results  Component Value Date   WBC 6.0 03/19/2010   HGB 13.4 03/19/2010   HCT 39.5 03/19/2010   PLT 254.0 03/19/2010   GLUCOSE 96 08/03/2012   CHOL 222* 03/19/2010   TRIG 38.0 03/19/2010   HDL 91.90 03/19/2010   LDLDIRECT 115.1 03/19/2010   ALT 14 08/03/2012   AST 26 08/03/2012   NA 139 08/03/2012   K 4.3 08/03/2012   CL 102 08/03/2012   CREATININE 0.8 08/03/2012   BUN 14 08/03/2012   CO2 29 08/03/2012   TSH 3.42 03/19/2010  INR 1.0 03/17/2007         Assessment & Plan:

## 2012-08-03 NOTE — Telephone Encounter (Signed)
Pt informed okay to come in for lab order.

## 2012-08-04 ENCOUNTER — Telehealth: Payer: Self-pay | Admitting: Internal Medicine

## 2012-08-04 NOTE — Telephone Encounter (Signed)
Called pt and left detailed vm that per Dr Debby Bud, her labs are normal, blood sugar is normal at 96; her kidney function normal and liver functions are normal. He sent a copy to Dr. Madelon Lips. If she has any further questions, please give [place] a call.

## 2012-08-04 NOTE — Telephone Encounter (Signed)
Please call patient - her labs were normal: blood sugar normal at 96, kidney function normal, liver functions normal. Copy sent to Dr. Madelon Lips  Thanks

## 2012-08-04 NOTE — Assessment & Plan Note (Signed)
Followed by Dr. Madelon Lips. He requests Bmet since she is taking meloxicam. She has declined surgery right knee at this time. She does OK with a cane- able to walk her dog every day.

## 2012-08-04 NOTE — Assessment & Plan Note (Signed)
On NSAID but has not complaint of discomfort or other signs of GI injury.

## 2012-08-04 NOTE — Assessment & Plan Note (Signed)
BP Readings from Last 3 Encounters:  08/03/12 122/72  07/29/11 126/84  06/28/11 130/88   Good control of BP. On no medications.

## 2013-01-13 ENCOUNTER — Ambulatory Visit (INDEPENDENT_AMBULATORY_CARE_PROVIDER_SITE_OTHER): Payer: Medicare Other | Admitting: Internal Medicine

## 2013-01-13 ENCOUNTER — Encounter: Payer: Self-pay | Admitting: Internal Medicine

## 2013-01-13 VITALS — BP 134/80 | HR 91 | Temp 98.7°F | Resp 16 | Wt 106.0 lb

## 2013-01-13 DIAGNOSIS — J309 Allergic rhinitis, unspecified: Secondary | ICD-10-CM

## 2013-01-13 DIAGNOSIS — I10 Essential (primary) hypertension: Secondary | ICD-10-CM

## 2013-01-13 DIAGNOSIS — H1010 Acute atopic conjunctivitis, unspecified eye: Secondary | ICD-10-CM

## 2013-01-13 MED ORDER — DEXAMETHASONE 0.1 % OP SUSP: 1.0000 [drp] | Freq: Four times a day (QID) | OPHTHALMIC | Status: DC

## 2013-01-13 MED ORDER — METHYLPREDNISOLONE ACETATE 80 MG/ML IJ SUSP
120.0000 mg | Freq: Once | INTRAMUSCULAR | Status: AC
  Administered 2013-01-13: 120 mg via INTRAMUSCULAR

## 2013-01-13 NOTE — Patient Instructions (Signed)
Allergic Rhinitis Allergic rhinitis is when the mucous membranes in the nose respond to allergens. Allergens are particles in the air that cause your body to have an allergic reaction. This causes you to release allergic antibodies. Through a chain of events, these eventually cause you to release histamine into the blood stream (hence the use of antihistamines). Although meant to be protective to the body, it is this release that causes your discomfort, such as frequent sneezing, congestion and an itchy runny nose.  CAUSES  The pollen allergens may come from grasses, trees, and weeds. This is seasonal allergic rhinitis, or "hay fever." Other allergens cause year-round allergic rhinitis (perennial allergic rhinitis) such as house dust mite allergen, pet dander and mold spores.  SYMPTOMS   Nasal stuffiness (congestion).  Runny, itchy nose with sneezing and tearing of the eyes.  There is often an itching of the mouth, eyes and ears. It cannot be cured, but it can be controlled with medications. DIAGNOSIS  If you are unable to determine the offending allergen, skin or blood testing may find it. TREATMENT   Avoid the allergen.  Medications and allergy shots (immunotherapy) can help.  Hay fever may often be treated with antihistamines in pill or nasal spray forms. Antihistamines block the effects of histamine. There are over-the-counter medicines that may help with nasal congestion and swelling around the eyes. Check with your caregiver before taking or giving this medicine. If the treatment above does not work, there are many new medications your caregiver can prescribe. Stronger medications may be used if initial measures are ineffective. Desensitizing injections can be used if medications and avoidance fails. Desensitization is when a patient is given ongoing shots until the body becomes less sensitive to the allergen. Make sure you follow up with your caregiver if problems continue. SEEK MEDICAL  CARE IF:   You develop fever (more than 100.5 F (38.1 C).  You develop a cough that does not stop easily (persistent).  You have shortness of breath.  You start wheezing.  Symptoms interfere with normal daily activities. Document Released: 03/12/2001 Document Revised: 09/09/2011 Document Reviewed: 09/21/2008 Exit[employer] Patient Information 2014 [employer].  

## 2013-01-14 ENCOUNTER — Encounter: Payer: Self-pay | Admitting: Internal Medicine

## 2013-01-14 NOTE — Progress Notes (Signed)
Subjective:    Patient ID: Cynthia Vargas, female    DOB: 1932/06/24, 79 y.o.   MRN: [id number]  Allergic Reaction This is a new problem. The current episode started 3 to 5 days ago. The problem occurs constantly. The problem is unchanged. The problem is moderate. It is unknown what she was exposed to. Associated symptoms include eye itching, eye redness and eye watering. Pertinent negatives include no abdominal pain, chest pain, chest pressure, coughing, diarrhea, difficulty breathing, drooling, globus sensation, hyperventilation, itching, rash, stridor, trouble swallowing, vomiting or wheezing. There is no swelling present. Past treatments include diphenhydramine. The treatment provided mild relief. Her past medical history is significant for seasonal allergies. There is no history of asthma, atopic dermatitis, food allergies or medication allergies.      Review of Systems  Constitutional: Negative.  Negative for fever, chills, diaphoresis, activity change, appetite change, fatigue and unexpected weight change.  HENT: Positive for congestion, rhinorrhea, sneezing and postnasal drip. Negative for ear pain, nosebleeds, sore throat, facial swelling, drooling, trouble swallowing, neck pain, sinus pressure and tinnitus.   Eyes: Positive for redness and itching. Negative for photophobia, pain, discharge and visual disturbance.  Respiratory: Negative.  Negative for cough, chest tightness, shortness of breath, wheezing and stridor.   Cardiovascular: Negative for chest pain, palpitations and leg swelling.  Gastrointestinal: Negative for vomiting, abdominal pain and diarrhea.  Endocrine: Negative.   Genitourinary: Negative.   Musculoskeletal: Negative.   Skin: Negative.  Negative for itching and rash.  Allergic/Immunologic: Positive for environmental allergies. Negative for food allergies and immunocompromised state.  Neurological: Negative.   Hematological: Negative.  Negative for adenopathy.  Does not bruise/bleed easily.  Psychiatric/Behavioral: Negative.        Objective:   Physical Exam  Vitals reviewed. Constitutional: She is oriented to person, place, and time. She appears well-developed and well-nourished.  Non-toxic appearance. She does not have a sickly appearance. She does not appear ill. No distress.  HENT:  Right Ear: Hearing, tympanic membrane, external ear and ear canal normal.  Left Ear: Hearing, tympanic membrane, external ear and ear canal normal.  Nose: Mucosal edema and rhinorrhea present. No sinus tenderness or nasal septal hematoma.  No foreign bodies. Right sinus exhibits no maxillary sinus tenderness and no frontal sinus tenderness. Left sinus exhibits no maxillary sinus tenderness and no frontal sinus tenderness.  Mouth/Throat: Oropharynx is clear and moist.  Eyes: EOM are normal. Pupils are equal, round, and reactive to light. No foreign bodies found. Right eye exhibits no chemosis, no discharge, no exudate and no hordeolum. No foreign body present in the right eye. Left eye exhibits chemosis. Left eye exhibits no discharge, no exudate and no hordeolum. No foreign body present in the left eye. Right conjunctiva is injected. Right conjunctiva has no hemorrhage. Left conjunctiva is injected. Left conjunctiva has no hemorrhage. No scleral icterus. Right eye exhibits normal extraocular motion and no nystagmus. Left eye exhibits normal extraocular motion and no nystagmus.    Neck: Normal range of motion. Neck supple. No JVD present. No tracheal deviation present. No thyromegaly present.  Cardiovascular: Normal rate, regular rhythm, normal heart sounds and intact distal pulses.  Exam reveals no gallop and no friction rub.   No murmur heard. Pulmonary/Chest: Effort normal and breath sounds normal. No stridor. No respiratory distress. She has no wheezes. She has no rales. She exhibits no tenderness.  Abdominal: Soft. Bowel sounds are normal. She exhibits no distension  and no mass. There is no tenderness. There is no  rebound and no guarding.  Musculoskeletal: Normal range of motion. She exhibits no edema and no tenderness.  Lymphadenopathy:    She has no cervical adenopathy.  Neurological: She is oriented to person, place, and time.  Skin: Skin is warm and dry. No rash noted. She is not diaphoretic. No erythema. No pallor.  Psychiatric: She has a normal mood and affect. Her behavior is normal. Judgment and thought content normal.          Assessment & Plan:

## 2013-01-14 NOTE — Assessment & Plan Note (Signed)
She is having a flare of symptoms so I gave her an injection of depo-medrol IM

## 2013-01-14 NOTE — Assessment & Plan Note (Signed)
Will treat with dexamethasone eye drops

## 2013-01-14 NOTE — Assessment & Plan Note (Signed)
Her BP is well controlled 

## 2013-03-24 ENCOUNTER — Ambulatory Visit (INDEPENDENT_AMBULATORY_CARE_PROVIDER_SITE_OTHER): Payer: Medicare Other | Admitting: Internal Medicine

## 2013-03-24 ENCOUNTER — Encounter: Payer: Self-pay | Admitting: Internal Medicine

## 2013-03-24 VITALS — BP 170/100 | HR 79 | Temp 96.4°F | Wt 108.0 lb

## 2013-03-24 DIAGNOSIS — L255 Unspecified contact dermatitis due to plants, except food: Secondary | ICD-10-CM

## 2013-03-24 DIAGNOSIS — L247 Irritant contact dermatitis due to plants, except food: Secondary | ICD-10-CM

## 2013-03-24 DIAGNOSIS — Z23 Encounter for immunization: Secondary | ICD-10-CM

## 2013-03-24 DIAGNOSIS — R21 Rash and other nonspecific skin eruption: Secondary | ICD-10-CM

## 2013-03-24 DIAGNOSIS — R0789 Other chest pain: Secondary | ICD-10-CM

## 2013-03-24 MED ORDER — PREDNISONE 10 MG PO TABS: 10.0000 mg | ORAL_TABLET | Freq: Every day | ORAL | Status: DC

## 2013-03-24 NOTE — Progress Notes (Signed)
Subjective:    Patient ID: Cynthia Vargas, female    DOB: 23-Oct-1931, 79 y.o.   MRN: [id number]  HPI Cynthia Vargas had an outbreak of a rash on the right forearm - started as little red bumps and progressed to a swath from wrist to elbow on the volar surface. It was very pruritis, may have had vesicles, but not painful. No other areas involved. She does have a h/o contact allergens, e.g. Poison ivy, etc. She did self - treat with a spray for itching due to allergy. No respiratory symptoms.  Cynthia Vargas does report that she has been having marked substernal chest pressure  In the early AM, worse after a preceeding day of activity. She also reports having increased "heartburn" in the high epigastrum. She has definitely noticed decreased exercise tolerance over the last six months.  Cardiac risk: post-menopausal woman, mother with a h/o heart disease. Non-smoker, normal cholesterol, normotensive (although elevated BP today).   Past Medical History  Diagnosis Date  . CALCANEAL FRACTURE, RIGHT 11/04/2007  . DILATION AND CURETTAGE, HX OF 11/04/2007  . KNEE PAIN, RIGHT, CHRONIC 03/19/2010  . OSTEOPOROSIS 05/02/2007  . PEPTIC ULCER DISEASE 05/02/2007  . Personal History of Other Diseases of Digestive Disease 11/04/2007  . Unspecified essential hypertension 05/02/2007   Past Surgical History  Procedure Laterality Date  . Dilation and curettage of uterus    . Cholecystectomy    . Tonsillectomy    . Calcaneal fracture, right    . Orif hip fracture     Family History  Problem Relation Age of Onset  . Cancer Father   . Heart disease Mother   . Diabetes Neg Hx   . Hyperlipidemia Neg Hx   . Hypertension Neg Hx    History   Social History  . Marital Status: Single    Spouse Name: N/A    Number of Children: 1  . Years of Education: 16   Occupational History  . reitred    Social History Main Topics  . Smoking status: Never Smoker   . Smokeless tobacco: Never Used  . Alcohol Use: No  . Drug Use:  No  . Sexual Activity: No   Other Topics Concern  . Not on file   Social History Narrative   [employer] degrees in Ambulance person. married for 2 years then single . 1 son - Lovie Chol [place]. 2 daughters-put up for adoption. Lives alone and is independent in ADL's. works at [employer] until injured.     Current Outpatient Prescriptions on File Prior to Visit  Medication Sig Dispense Refill  . acetaminophen (TYLENOL) 500 MG tablet Take 1,000 mg by mouth as needed.       [name] dexamethasone (DECADRON) 0.1 % ophthalmic suspension Place 1 drop into both eyes every 6 (six) hours.  15 mL  0  . fexofenadine (ALLEGRA) 60 MG tablet Take 60 mg by mouth daily.        . meloxicam (MOBIC) 7.5 MG tablet Take 7.5 mg by mouth daily. Take 1 tablet a day as needed for pain and swelling.       No current facility-administered medications on file prior to visit.      Review of Systems System review is negative for any constitutional, cardiac, pulmonary, GI or neuro symptoms or complaints other than as described in the HPI.     Objective:   Physical Exam Filed Vitals:   03/24/13 1555  BP: 170/100  Pulse: 79  Temp: 96.4 F (35.8 C)  Wt Readings from Last 3 Encounters:  03/24/13 108 lb (48.988 kg)  01/13/13 106 lb (48.081 kg)  08/03/12 106 lb 1.9 oz (48.136 kg)   Gen'l- very thin woman in no distress HEENT- C&S clear Cor 2+ radial, quiety precordium, RRR Pulm - normal breath sounds Derm - erythematous macular papular rash right forearm wrist of  elbow, w/o vesicles.  12 leaf EKG - no acute changes; flipped T-waves in precordial leads, unchanged from previous study.     Assessment & Plan:  1. Rash on the right arm - looks like a contact dermatitis. May be from plant contact brought to you by your dog.   Plan Prednisone burst and taper - Rx sent to drugstore  [facility]  2. Substernal chest pain in early AM and some reflux symptoms - a bit of concern about the heavy pressure in your  chest and decreased exercise tolerance. The EKG today does not reveal any acute abnormality and seems to be unchanged from the last EKG in 2011.  Plan Over the counter Ranitidine (Zantac by  Brand name) 150 mg twice a day for 3-4 weeks.  If symptoms are not controlled will try Prilosec or the equivalent - a proton pump inhibitor  For unrelieved symptoms or for progressive problems with chest pressure: increased frequency, duration or any exercise related discomfort further tests

## 2013-03-24 NOTE — Patient Instructions (Addendum)
1. Rash on the right arm - looks like a contact dermatitis. May be from plant contact brought to you by your dog.   Plan Prednisone burst and taper - Rx sent to drugstore  [facility]    2. Substernal chest pain in early AM and some reflux symptoms - a bit of concern about the heavy pressure in your chest and decreased exercise tolerance. The EKG today does not reveal any acute abnormality and seems to be unchanged from the last EKG in 2011.  Plan Over the counter Ranitidine (Zantac by  Brand name) 150 mg twice a day for 3-4 weeks.  If symptoms are not controlled will try Prilosec or the equivalent - a proton pump inhibitor  For unrelieved symptoms or for progressive problems with chest pressure: increased frequency, duration or any exercise related discomfort further tests

## 2013-03-29 ENCOUNTER — Emergency Department (HOSPITAL_COMMUNITY): Payer: Medicare Other

## 2013-03-29 ENCOUNTER — Encounter (HOSPITAL_COMMUNITY): Payer: Self-pay | Admitting: *Deleted

## 2013-03-29 ENCOUNTER — Inpatient Hospital Stay (HOSPITAL_COMMUNITY)
Admission: EM | Admit: 2013-03-29 | Discharge: 2013-04-01 | DRG: 378 | Disposition: A | Discharge status: Home / Self Care | Payer: Medicare Other | Attending: Internal Medicine | Admitting: Internal Medicine

## 2013-03-29 DIAGNOSIS — M810 Age-related osteoporosis without current pathological fracture: Secondary | ICD-10-CM | POA: Diagnosis present

## 2013-03-29 DIAGNOSIS — K922 Gastrointestinal hemorrhage, unspecified: Secondary | ICD-10-CM | POA: Diagnosis present

## 2013-03-29 DIAGNOSIS — T380X5A Adverse effect of glucocorticoids and synthetic analogues, initial encounter: Secondary | ICD-10-CM | POA: Diagnosis present

## 2013-03-29 DIAGNOSIS — I10 Essential (primary) hypertension: Secondary | ICD-10-CM | POA: Diagnosis present

## 2013-03-29 DIAGNOSIS — D62 Acute posthemorrhagic anemia: Secondary | ICD-10-CM | POA: Diagnosis present

## 2013-03-29 DIAGNOSIS — IMO0002 Reserved for concepts with insufficient information to code with codable children: Secondary | ICD-10-CM

## 2013-03-29 DIAGNOSIS — K263 Acute duodenal ulcer without hemorrhage or perforation: Secondary | ICD-10-CM | POA: Diagnosis present

## 2013-03-29 DIAGNOSIS — Z79899 Other long term (current) drug therapy: Secondary | ICD-10-CM

## 2013-03-29 DIAGNOSIS — R531 Weakness: Secondary | ICD-10-CM

## 2013-03-29 DIAGNOSIS — D649 Anemia, unspecified: Secondary | ICD-10-CM | POA: Diagnosis present

## 2013-03-29 DIAGNOSIS — K264 Chronic or unspecified duodenal ulcer with hemorrhage: Principal | ICD-10-CM | POA: Diagnosis present

## 2013-03-29 DIAGNOSIS — K279 Peptic ulcer, site unspecified, unspecified as acute or chronic, without hemorrhage or perforation: Secondary | ICD-10-CM

## 2013-03-29 DIAGNOSIS — K449 Diaphragmatic hernia without obstruction or gangrene: Secondary | ICD-10-CM | POA: Diagnosis present

## 2013-03-29 DIAGNOSIS — T3995XA Adverse effect of unspecified nonopioid analgesic, antipyretic and antirheumatic, initial encounter: Secondary | ICD-10-CM | POA: Diagnosis present

## 2013-03-29 LAB — BASIC METABOLIC PANEL
BUN: 14 mg/dL (ref 6–23)
CO2: 26 mEq/L (ref 19–32)
Calcium: 7.8 mg/dL — ABNORMAL LOW (ref 8.4–10.5)
Chloride: 98 mEq/L (ref 96–112)
Creatinine, Ser: 0.82 mg/dL (ref 0.50–1.10)
GFR calc Af Amer: 77 mL/min — ABNORMAL LOW (ref >90)
GFR calc non Af Amer: 66 mL/min — ABNORMAL LOW (ref >90)
Glucose, Bld: 122 mg/dL — ABNORMAL HIGH (ref 70–99)
Potassium: 3.4 mEq/L — ABNORMAL LOW (ref 3.5–5.1)
Sodium: 132 mEq/L — ABNORMAL LOW (ref 135–145)

## 2013-03-29 LAB — CBC WITH DIFFERENTIAL/PLATELET
Basophils Absolute: 0.1 10*3/uL (ref 0.0–0.1)
Basophils Relative: 1 % (ref 0–1)
Eosinophils Absolute: 0 10*3/uL (ref 0.0–0.7)
Eosinophils Relative: 0 % (ref 0–5)
HCT: 15.2 % — ABNORMAL LOW (ref 36.0–46.0)
Hemoglobin: 5.1 g/dL — CL (ref 12.0–15.0)
Lymphocytes Relative: 15 % (ref 12–46)
Lymphs Abs: 1.3 10*3/uL (ref 0.7–4.0)
MCH: 31.1 pg (ref 26.0–34.0)
MCHC: 33.6 g/dL (ref 30.0–36.0)
MCV: 92.7 fL (ref 78.0–100.0)
Monocytes Absolute: 0.7 10*3/uL (ref 0.1–1.0)
Monocytes Relative: 9 % (ref 3–12)
Neutro Abs: 6.1 10*3/uL (ref 1.7–7.7)
Neutrophils Relative %: 75 % (ref 43–77)
Platelets: 196 10*3/uL (ref 150–400)
RBC: 1.64 MIL/uL — ABNORMAL LOW (ref 3.87–5.11)
RDW: 14.7 % (ref 11.5–15.5)
WBC: 8.1 10*3/uL (ref 4.0–10.5)

## 2013-03-29 LAB — CG4 I-STAT (LACTIC ACID): Lactic Acid, Venous: 0.79 mmol/L (ref 0.5–2.2)

## 2013-03-29 LAB — PREPARE RBC (CROSSMATCH)

## 2013-03-29 MED ORDER — FENTANYL CITRATE 0.05 MG/ML IJ SOLN
25.0000 ug | Freq: Once | INTRAMUSCULAR | Status: AC
  Administered 2013-03-29: 25 ug via INTRAVENOUS
  Filled 2013-03-29: qty 2

## 2013-03-29 MED ORDER — SODIUM CHLORIDE 0.9 % IV BOLUS (SEPSIS)
1000.0000 mL | Freq: Once | INTRAVENOUS | Status: AC
  Administered 2013-03-29: 1000 mL via INTRAVENOUS

## 2013-03-29 NOTE — Progress Notes (Signed)
Pt assigned to room 1235,Called ED for report on patient, placed on hold for - no response. Will call again

## 2013-03-29 NOTE — ED Notes (Signed)
Bed: ON62 Expected date:  Expected time:  Means of arrival:  Comments: EMS fever, syncope, N/V, weakness

## 2013-03-29 NOTE — ED Provider Notes (Signed)
CSN: [id number]     Arrival date & time 03/29/13  2103 History   First MD Initiated Contact with Patient 03/29/13 2121     Chief Complaint  Patient presents with  . Fever  . Weakness   (Consider location/radiation/quality/duration/timing/severity/associated sxs/prior Treatment) Patient is a 79 y.o. female presenting with fever, weakness, and hematochezia. The history is provided by the patient.  Fever Max temp prior to arrival:  Unknown Temp source:  Subjective Severity:  Mild Duration:  2 days Timing:  Intermittent Progression:  Unchanged Chronicity:  New Relieved by:  Nothing Worsened by:  Nothing tried Associated symptoms: no vomiting   Weakness Pertinent negatives include no abdominal pain.  Rectal Bleeding Quality:  Bright red Amount:  Moderate Duration:  2 days (2 days ago) Timing:  Intermittent Progression:  Partially resolved Chronicity:  New Context: not anal fissures and not anal penetration   Similar prior episodes: no   Relieved by:  Nothing Worsened by:  Nothing tried Associated symptoms: dizziness, fever and light-headedness   Associated symptoms: no abdominal pain, no epistaxis, no loss of consciousness, no recent illness and no vomiting   Risk factors: steroid use   Risk factors: no anticoagulant use, no hx of colorectal cancer and no liver disease     Past Medical History  Diagnosis Date  . CALCANEAL FRACTURE, RIGHT 11/04/2007  . DILATION AND CURETTAGE, HX OF 11/04/2007  . KNEE PAIN, RIGHT, CHRONIC 03/19/2010  . OSTEOPOROSIS 05/02/2007  . PEPTIC ULCER DISEASE 05/02/2007  . Personal History of Other Diseases of Digestive Disease 11/04/2007  . Unspecified essential hypertension 05/02/2007   Past Surgical History  Procedure Laterality Date  . Dilation and curettage of uterus    . Cholecystectomy    . Tonsillectomy    . Calcaneal fracture, right    . Orif hip fracture     Family History  Problem Relation Age of Onset  . Cancer Father   . Heart disease  Mother   . Diabetes Neg Hx   . Hyperlipidemia Neg Hx   . Hypertension Neg Hx    History  Substance Use Topics  . Smoking status: Never Smoker   . Smokeless tobacco: Never Used  . Alcohol Use: No   OB History   Grav Para Term Preterm Abortions TAB SAB Ect Mult Living                 Review of Systems  Constitutional: Positive for fever.  HENT: Negative for nosebleeds.   Gastrointestinal: Positive for hematochezia. Negative for vomiting and abdominal pain.  Neurological: Positive for dizziness, weakness and light-headedness. Negative for loss of consciousness.  All other systems reviewed and are negative.    Allergies  Peanut-containing drug products  Home Medications   Current Outpatient Rx  Name  Route  Sig  Dispense  Refill  . acetaminophen (TYLENOL) 500 MG tablet   Oral   Take 1,000 mg by mouth as needed.          [name] dexamethasone (DECADRON) 0.1 % ophthalmic suspension   Both Eyes   Place 1 drop into both eyes every 6 (six) hours.   15 mL   0   . fexofenadine (ALLEGRA) 60 MG tablet   Oral   Take 60 mg by mouth daily.           . meloxicam (MOBIC) 7.5 MG tablet   Oral   Take 7.5 mg by mouth daily. Take 1 tablet a day as needed for pain and swelling.         [name]  predniSONE (DELTASONE) 10 MG tablet   Oral   Take 1 tablet (10 mg total) by mouth daily. 3 tabs daily for 3 days; 2 tabs daily for 3 days; 1 tab daily for 6 days   21 tablet   0    BP 110/61  Pulse 106  Temp(Src) 98.4 F (36.9 C) (Oral)  Resp 18  SpO2 100% Physical Exam  Nursing note and vitals reviewed. Constitutional: She is oriented to person, place, and time. She appears well-developed and well-nourished. No distress.  Patient extremely pale  HENT:  Head: Normocephalic and atraumatic.  Eyes: EOM are normal. Pupils are equal, round, and reactive to light.  Neck: Normal range of motion. Neck supple.  Cardiovascular: Normal rate and regular rhythm.  Exam reveals no friction rub.   No  murmur heard. Pulmonary/Chest: Effort normal and breath sounds normal. No respiratory distress. She has no wheezes. She has no rales.  Abdominal: Soft. She exhibits no distension. There is no tenderness. There is no rebound.  Genitourinary: Rectal exam shows no external hemorrhoid and no fissure. Guaiac positive stool (no gross blood).  Musculoskeletal: Normal range of motion. She exhibits no edema.  Neurological: She is alert and oriented to person, place, and time.  Skin: She is not diaphoretic.    ED Course  Procedures (including critical care time) Labs Review Labs Reviewed  CBC WITH DIFFERENTIAL - Abnormal; Notable for the following:    RBC 1.64 (*)    Hemoglobin 5.1 (*)    HCT 15.2 (*)    All other components within normal limits  BASIC METABOLIC PANEL - Abnormal; Notable for the following:    Sodium 132 (*)    Potassium 3.4 (*)    Glucose, Bld 122 (*)    Calcium 7.8 (*)    GFR calc non Af Amer 66 (*)    GFR calc Af Amer 77 (*)    All other components within normal limits  URINE CULTURE  URINALYSIS, ROUTINE W REFLEX MICROSCOPIC  OCCULT BLOOD X 1 CARD TO LAB, STOOL  CG4 I-STAT (LACTIC ACID)  TYPE AND SCREEN  PREPARE RBC (CROSSMATCH)   Imaging Review Dg Chest 2 View  03/29/2013   CLINICAL DATA:  Fever and weakness.  EXAM: CHEST  2 VIEW  COMPARISON:  03/17/2007.  FINDINGS: The cardiac silhouette, mediastinal and hilar contours are within normal limits and stable. There is tortuosity and calcification of the thoracic aorta. Stable emphysematous changes and minimal pulmonary scarring. No acute pulmonary findings. The bony thorax is intact. Minimal compression deformities in the mid thoracic spine are noted.  IMPRESSION: Emphysematous changes but no acute overlying pulmonary process.   Electronically Signed   By: Loralie Champagne M.D.   On: 03/29/2013 22:15   Ct Head Wo Contrast  03/29/2013   CLINICAL DATA:  Jaundice weakness, syncope.  EXAM: CT HEAD WITHOUT CONTRAST  TECHNIQUE:  Contiguous axial images were obtained from the base of the skull through the vertex without intravenous contrast.  COMPARISON:  None  FINDINGS: Extensive white matter disease throughout the deep and periventricular white matter. No acute intracranial abnormality. Specifically, no hemorrhage, hydrocephalus, mass lesion, acute infarction, or significant intracranial injury. No acute calvarial abnormality. Visualized paranasal sinuses and mastoids clear. Orbital soft tissues unremarkable.  IMPRESSION: Extensive white matter changes, likely chronic ischemic changes. No acute findings.   Electronically Signed   By: Charlett Nose M.D.   On: 03/29/2013 22:22    MDM   1. GI bleed   2. Anemia  3. Weakness    82F here with fever, weakness. Unknown temp, afebrile here. Flat affect. Has blood in her hair, states she fell and hit the ground 2 days ago after she had large blood bowel movement and blood got in her hair then. No continued bloody bowel movements. No nausea, vomiting, but has had some diarrhea. Patient had syncopal episode today and is extremely pale and weak. Denies SOB, CP.  On exam, belly benign, normal BP, mild tachycardia. Patient extremely pale. Patient with no gross blood on rectal exam, but has hemoccult positive. Normal lactate, normal WBC count. CT Head normal, normal CXR. Fluids given.  CBC with Hgb of 5.1 Admitted to stepdown. Transfusion ordered.    Dagmar Hait, MD 03/29/13 [phone]

## 2013-03-29 NOTE — ED Notes (Signed)
PT to ER via EMS for complaints of generalized weakness & fever x 2 days; pt states that she had a near syncopal episode this evening, pt also reports numerous episodes of diarrhea over the last 2 days

## 2013-03-29 NOTE — ED Notes (Signed)
Dr Gwendolyn Grant notified of hgb 5.1

## 2013-03-30 ENCOUNTER — Encounter (HOSPITAL_COMMUNITY)
Admission: EM | Disposition: A | Discharge status: Home / Self Care | Payer: Self-pay | Source: Home / Self Care | Attending: Internal Medicine

## 2013-03-30 ENCOUNTER — Encounter (HOSPITAL_COMMUNITY): Payer: Self-pay | Admitting: Internal Medicine

## 2013-03-30 DIAGNOSIS — K922 Gastrointestinal hemorrhage, unspecified: Secondary | ICD-10-CM | POA: Diagnosis present

## 2013-03-30 DIAGNOSIS — K263 Acute duodenal ulcer without hemorrhage or perforation: Secondary | ICD-10-CM | POA: Diagnosis present

## 2013-03-30 DIAGNOSIS — R5381 Other malaise: Secondary | ICD-10-CM

## 2013-03-30 DIAGNOSIS — D649 Anemia, unspecified: Secondary | ICD-10-CM

## 2013-03-30 HISTORY — PX: ESOPHAGOGASTRODUODENOSCOPY: SHX5428

## 2013-03-30 LAB — URINALYSIS, ROUTINE W REFLEX MICROSCOPIC
Bilirubin Urine: NEGATIVE
Glucose, UA: NEGATIVE mg/dL
Ketones, ur: NEGATIVE mg/dL
Nitrite: NEGATIVE
Protein, ur: NEGATIVE mg/dL
Specific Gravity, Urine: 1.02 (ref 1.005–1.030)
Urobilinogen, UA: 0.2 mg/dL (ref 0.0–1.0)
pH: 5.5 (ref 5.0–8.0)

## 2013-03-30 LAB — TROPONIN I
Troponin I: <0.3 ng/mL (ref <0.30)
Troponin I: <0.3 ng/mL (ref <0.30)
Troponin I: <0.3 ng/mL (ref <0.30)

## 2013-03-30 LAB — CBC
HCT: 24.5 % — ABNORMAL LOW (ref 36.0–46.0)
Hemoglobin: 8.4 g/dL — ABNORMAL LOW (ref 12.0–15.0)
MCH: 32.3 pg (ref 26.0–34.0)
MCHC: 34.3 g/dL (ref 30.0–36.0)
MCV: 94.2 fL (ref 78.0–100.0)
Platelets: 170 10*3/uL (ref 150–400)
RBC: 2.6 MIL/uL — ABNORMAL LOW (ref 3.87–5.11)
RDW: 16.4 % — ABNORMAL HIGH (ref 11.5–15.5)
WBC: 5.9 10*3/uL (ref 4.0–10.5)

## 2013-03-30 LAB — URINE MICROSCOPIC-ADD ON

## 2013-03-30 LAB — IRON AND TIBC
Iron: 47 ug/dL (ref 42–135)
Saturation Ratios: 22 % (ref 20–55)
TIBC: 216 ug/dL — ABNORMAL LOW (ref 250–470)
UIBC: 169 ug/dL (ref 125–400)

## 2013-03-30 LAB — GLUCOSE, CAPILLARY
Glucose-Capillary: 84 mg/dL (ref 70–99)
Glucose-Capillary: 143 mg/dL — ABNORMAL HIGH (ref 70–99)

## 2013-03-30 LAB — BASIC METABOLIC PANEL
BUN: 11 mg/dL (ref 6–23)
CO2: 24 mEq/L (ref 19–32)
Calcium: 7.5 mg/dL — ABNORMAL LOW (ref 8.4–10.5)
Chloride: 104 mEq/L (ref 96–112)
Creatinine, Ser: 0.71 mg/dL (ref 0.50–1.10)
GFR calc Af Amer: >90 mL/min (ref >90)
GFR calc non Af Amer: 80 mL/min — ABNORMAL LOW (ref >90)
Glucose, Bld: 100 mg/dL — ABNORMAL HIGH (ref 70–99)
Potassium: 3.3 mEq/L — ABNORMAL LOW (ref 3.5–5.1)
Sodium: 135 mEq/L (ref 135–145)

## 2013-03-30 LAB — MRSA PCR SCREENING: MRSA by PCR: NEGATIVE

## 2013-03-30 LAB — RETICULOCYTES
RBC.: 1.68 MIL/uL — ABNORMAL LOW (ref 3.87–5.11)
Retic Count, Absolute: 99.1 10*3/uL (ref 19.0–186.0)
Retic Ct Pct: 5.9 % — ABNORMAL HIGH (ref 0.4–3.1)

## 2013-03-30 LAB — FERRITIN: Ferritin: 32 ng/mL (ref 10–291)

## 2013-03-30 LAB — HEMOGLOBIN AND HEMATOCRIT, BLOOD
HCT: 22 % — ABNORMAL LOW (ref 36.0–46.0)
Hemoglobin: 7.5 g/dL — ABNORMAL LOW (ref 12.0–15.0)

## 2013-03-30 LAB — FOLATE: Folate: >20 ng/mL

## 2013-03-30 LAB — VITAMIN B12: Vitamin B-12: 295 pg/mL (ref 211–911)

## 2013-03-30 LAB — OCCULT BLOOD, POC DEVICE: Fecal Occult Bld: POSITIVE — AB

## 2013-03-30 SURGERY — EGD (ESOPHAGOGASTRODUODENOSCOPY)
Anesthesia: Moderate Sedation

## 2013-03-30 MED ORDER — SUCRALFATE 1 GM/10ML PO SUSP
1.0000 g | Freq: Three times a day (TID) | ORAL | Status: DC
  Administered 2013-03-30 – 2013-04-01 (×9): 1 g via ORAL
  Filled 2013-03-30 (×11): qty 10

## 2013-03-30 MED ORDER — MIDAZOLAM HCL 10 MG/2ML IJ SOLN
INTRAMUSCULAR | Status: AC
  Filled 2013-03-30: qty 4

## 2013-03-30 MED ORDER — SODIUM CHLORIDE 0.9 % IV SOLN: INTRAVENOUS | Status: DC

## 2013-03-30 MED ORDER — SODIUM CHLORIDE 0.9 % IV SOLN
80.0000 mg | Freq: Once | INTRAVENOUS | Status: AC
  Administered 2013-03-30: 02:00:00 80 mg via INTRAVENOUS
  Filled 2013-03-30: qty 80

## 2013-03-30 MED ORDER — SODIUM CHLORIDE 0.9 % IJ SOLN
3.0000 mL | Freq: Two times a day (BID) | INTRAMUSCULAR | Status: DC
  Administered 2013-03-30 – 2013-04-01 (×5): 3 mL via INTRAVENOUS

## 2013-03-30 MED ORDER — DIPHENHYDRAMINE HCL 50 MG/ML IJ SOLN
INTRAMUSCULAR | Status: AC
  Filled 2013-03-30: qty 1

## 2013-03-30 MED ORDER — SODIUM CHLORIDE 0.9 % IV SOLN
INTRAVENOUS | Status: DC
  Administered 2013-03-30: 02:00:00 via INTRAVENOUS

## 2013-03-30 MED ORDER — PANTOPRAZOLE SODIUM 40 MG IV SOLR: 40.0000 mg | Freq: Two times a day (BID) | INTRAVENOUS | Status: DC

## 2013-03-30 MED ORDER — PANTOPRAZOLE SODIUM 40 MG IV SOLR
40.0000 mg | Freq: Two times a day (BID) | INTRAVENOUS | Status: DC
  Administered 2013-03-30: 40 mg via INTRAVENOUS
  Filled 2013-03-30 (×3): qty 40

## 2013-03-30 MED ORDER — ACETAMINOPHEN 650 MG RE SUPP: 650.0000 mg | Freq: Four times a day (QID) | RECTAL | Status: DC | PRN

## 2013-03-30 MED ORDER — ACETAMINOPHEN 325 MG PO TABS: 650.0000 mg | ORAL_TABLET | Freq: Four times a day (QID) | ORAL | Status: DC | PRN

## 2013-03-30 MED ORDER — ONDANSETRON HCL 4 MG PO TABS: 4.0000 mg | ORAL_TABLET | Freq: Four times a day (QID) | ORAL | Status: DC | PRN

## 2013-03-30 MED ORDER — FENTANYL CITRATE 0.05 MG/ML IJ SOLN
INTRAMUSCULAR | Status: AC
  Filled 2013-03-30: qty 4

## 2013-03-30 MED ORDER — MIDAZOLAM HCL 10 MG/2ML IJ SOLN
INTRAMUSCULAR | Status: DC | PRN
  Administered 2013-03-30 (×2): 2 mg via INTRAVENOUS

## 2013-03-30 MED ORDER — ONDANSETRON HCL 4 MG/2ML IJ SOLN: 4.0000 mg | Freq: Four times a day (QID) | INTRAMUSCULAR | Status: DC | PRN

## 2013-03-30 MED ORDER — BUTAMBEN-TETRACAINE-BENZOCAINE 2-2-14 % EX AERO
INHALATION_SPRAY | CUTANEOUS | Status: DC | PRN
  Administered 2013-03-30: 2 via TOPICAL

## 2013-03-30 MED ORDER — FENTANYL CITRATE 0.05 MG/ML IJ SOLN
INTRAMUSCULAR | Status: DC | PRN
  Administered 2013-03-30: 25 ug via INTRAVENOUS

## 2013-03-30 MED ORDER — SODIUM CHLORIDE 0.9 % IV SOLN
8.0000 mg/h | INTRAVENOUS | Status: DC
  Administered 2013-03-30: 8 mg/h via INTRAVENOUS
  Filled 2013-03-30 (×3): qty 80

## 2013-03-30 NOTE — Consult Note (Signed)
Referring Provider: No ref. provider found Primary Care Physician:  Illene Regulus, MD Primary Gastroenterologist:  Dr. Victorino Dike  Reason for Consultation:  GIB; blood loss anemia  HPI: Cynthia Vargas is a 79 y.o. female with no significant past history who was brought to the ER after having a brief episode of loss of consciousness yesterday. Patient states that over the last few days she's been feeling weak, tired, and dizzy at times.  A few days ago she also had some frank bleeding from rectum which was self-limited and now she has not moved her bowels since that time. Last evening she had tried to get up from the bed or chair while a friend was there and she passed out.  Was brought to the ED.  CT head was negative for any acute findings.  ER patient's lab work showed significant anemia with Hgb of 5.1 grams, and as per the ER physician stool for occult blood was positive. Patient was admitted and transfused with two units of PRBC's.  Hgb responded well to 8.4 grams.    Patient was recently placed on prednisone for skin rash otherwise patient states that she has not taken any new medication and she used to take NSAIDs many months ago but has not have taken anything recently. Patient had a colonoscopy 10 years ago by Dr. Corinda Gubler, which was unremarkable to her knowledge (we are trying to access those records from our office).  Never had an EGD.  She also states that recently has has been nauseated with some complaints of heartburn and chest discomfort.  This was mentioned to Dr. Debby Bud and EKG was performed and was unremarkable in his office.  She was told to get some OTC zantac but did not do so.  Has loss of appetite recently but no weight loss (says that she thinks she actually gained a couple of pounds).  No vomiting.  Says that BM's are usual regular without issues.  No bleeding issues prior to the episodes a couple of days ago.  No diarrhea or constipation with straining.  No abdominal pain.   No issues with swallowing.    Past Medical History  Diagnosis Date  . CALCANEAL FRACTURE, RIGHT 11/04/2007  . DILATION AND CURETTAGE, HX OF 11/04/2007  . KNEE PAIN, RIGHT, CHRONIC 03/19/2010  . OSTEOPOROSIS 05/02/2007  . PEPTIC ULCER DISEASE 05/02/2007  . Personal History of Other Diseases of Digestive Disease 11/04/2007  . Unspecified essential hypertension 05/02/2007    Past Surgical History  Procedure Laterality Date  . Dilation and curettage of uterus    . Cholecystectomy    . Tonsillectomy    . Calcaneal fracture, right    . Orif hip fracture      Prior to Admission medications   Medication Sig Start Date End Date Taking? Authorizing Provider  acetaminophen (TYLENOL) 500 MG tablet Take 1,000 mg by mouth as needed.    Yes Historical Provider, MD  dexamethasone (DECADRON) 0.1 % ophthalmic suspension Place 1 drop into both eyes every 6 (six) hours. 01/13/13  Yes Etta Grandchild, MD  fexofenadine (ALLEGRA) 60 MG tablet Take 60 mg by mouth daily.     Yes Historical Provider, MD  meloxicam (MOBIC) 7.5 MG tablet Take 7.5 mg by mouth daily. Take 1 tablet a day as needed for pain and swelling. 07/30/12  Yes Historical Provider, MD  predniSONE (DELTASONE) 10 MG tablet Take 1 tablet (10 mg total) by mouth daily. 3 tabs daily for 3 days; 2 tabs daily for  3 days; 1 tab daily for 6 days 03/24/13  Yes Jacques Navy, MD    Current Facility-Administered Medications  Medication Dose Route Frequency Provider Last Rate Last Dose  . 0.9 %  sodium chloride infusion   Intravenous Continuous Eduard Clos, MD 10 mL/hr at 03/30/13 0219    . acetaminophen (TYLENOL) tablet 650 mg  650 mg Oral Q6H PRN Eduard Clos, MD       Or  . acetaminophen (TYLENOL) suppository 650 mg  650 mg Rectal Q6H PRN Eduard Clos, MD      . ondansetron [facility]) tablet 4 mg  4 mg Oral Q6H PRN Eduard Clos, MD       Or  . ondansetron [facility]) injection 4 mg  4 mg Intravenous Q6H PRN Eduard Clos, MD       . pantoprazole (PROTONIX) 80 mg in sodium chloride 0.9 % 250 mL infusion  8 mg/hr Intravenous Continuous Eduard Clos, MD 25 mL/hr at 03/30/13 0300 8 mg/hr at 03/30/13 0300  . [START ON 04/02/2013] pantoprazole (PROTONIX) injection 40 mg  40 mg Intravenous Q12H Eduard Clos, MD      . sodium chloride 0.9 % injection 3 mL  3 mL Intravenous Q12H Eduard Clos, MD   3 mL at 03/30/13 0227    Allergies as of 03/29/2013 - Review Complete 03/29/2013  Allergen Reaction Noted  . Peanut-containing drug products Other (See Comments) 06/28/2011    Family History  Problem Relation Age of Onset  . Cancer Father   . Heart disease Mother   . Diabetes Neg Hx   . Hyperlipidemia Neg Hx   . Hypertension Neg Hx     History   Social History  . Marital Status: Single    Spouse Name: N/A    Number of Children: 1  . Years of Education: 16   Occupational History  . reitred    Social History Main Topics  . Smoking status: Never Smoker   . Smokeless tobacco: Never Used  . Alcohol Use: No  . Drug Use: No  . Sexual Activity: No   Other Topics Concern  . Not on file   Social History Narrative   [employer] degrees in Ambulance person. married for 2 years then single . 1 son - Cynthia Vargas. 2 daughters-put up for adoption. Lives alone and is independent in ADL's. works at [employer] until injured.    Review of Systems: Ten point ROS is O/W negative except as mentioned in HPI.  Physical Exam: Vital signs in last 24 hours: Temp:  [98.2 F (36.8 C)-99.1 F (37.3 C)] 99.1 F (37.3 C) (09/30 0800) Pulse Rate:  [81-107] 83 (09/30 1000) Resp:  [11-21] 18 (09/30 1000) BP: (104-123)/(54-71) 120/67 mmHg (09/30 0800) SpO2:  [98 %-100 %] 98 % (09/30 1000) Weight:  [102 lb 15.3 oz (46.7 kg)] 102 lb 15.3 oz (46.7 kg) (09/30 0200) Last BM Date: 03/28/13 General:   Alert, Well-developed, well-nourished, pleasant and cooperative in NAD Head:  Normocephalic and atraumatic. Eyes:   Sclera clear, no icterus.  Conjunctiva pink. Ears:  Normal auditory acuity. Mouth:  No deformity or lesions.   Lungs:  Clear throughout to auscultation.  No wheezes, crackles, or rhonchi.  Heart:  Regular rate and rhythm; no murmurs, clicks, rubs, or gallops. Abdomen:  Soft, non-tender, BS active, nonpalp mass or hsm.   Rectal:  Deferred.  Heme positive in the ED.  Msk:  Symmetrical without gross deformities. Pulses:  Normal  pulses noted. Extremities:  Without clubbing or edema. Neurologic:  Alert and  oriented x4;  grossly normal neurologically. Skin:  Intact without significant lesions or rashes. Psych:  Alert and cooperative. Normal mood and affect.  Intake/Output from previous day: 09/29 0701 - 09/30 0700 In: 1021 [I.V.:188; Blood:733; IV Piggyback:100] Out: 10 [Urine:10] Intake/Output this shift: Total I/O In: 105 [I.V.:105] Out: -   Lab Results:  Recent Labs  03/29/13 2150 03/30/13 0825  WBC 8.1 5.9  HGB 5.1* 8.4*  HCT 15.2* 24.5*  PLT 196 170   BMET  Recent Labs  03/29/13 2150 03/30/13 0825  NA 132* 135  K 3.4* 3.3*  CL 98 104  CO2 26 24  GLUCOSE 122* 100*  BUN 14 11  CREATININE 0.82 0.71  CALCIUM 7.8* 7.5*   Studies/Results: Dg Chest 2 View  03/29/2013   CLINICAL DATA:  Fever and weakness.  EXAM: CHEST  2 VIEW  COMPARISON:  03/17/2007.  FINDINGS: The cardiac silhouette, mediastinal and hilar contours are within normal limits and stable. There is tortuosity and calcification of the thoracic aorta. Stable emphysematous changes and minimal pulmonary scarring. No acute pulmonary findings. The bony thorax is intact. Minimal compression deformities in the mid thoracic spine are noted.  IMPRESSION: Emphysematous changes but no acute overlying pulmonary process.   Electronically Signed   By: Loralie Champagne M.D.   On: 03/29/2013 22:15   Ct Head Wo Contrast  03/29/2013   CLINICAL DATA:  Jaundice weakness, syncope.  EXAM: CT HEAD WITHOUT CONTRAST  TECHNIQUE:  Contiguous axial images were obtained from the base of the skull through the vertex without intravenous contrast.  COMPARISON:  None  FINDINGS: Extensive white matter disease throughout the deep and periventricular white matter. No acute intracranial abnormality. Specifically, no hemorrhage, hydrocephalus, mass lesion, acute infarction, or significant intracranial injury. No acute calvarial abnormality. Visualized paranasal sinuses and mastoids clear. Orbital soft tissues unremarkable.  IMPRESSION: Extensive white matter changes, likely chronic ischemic changes. No acute findings.   Electronically Signed   By: Charlett Nose M.D.   On: 03/29/2013 22:22    IMPRESSION:  -Acute blood loss anemia with heme positive stools:  Recent self limited rectal bleeding at home.  Hgb 5.1 grams on admission now s/p 2 units PRBC's with improvement to 8.4 grams. -Recent complaints of nausea, loss of appetite, heartburn  PLAN: -EGD today and potential colonoscopy tomorrow. -Monitor Hgb and transfuse further prn. -Will discontinue PPI gtt and give IV protonix BID.   Joselyn Edling D.  03/30/2013, 10:12 AM  Pager number [phone]

## 2013-03-30 NOTE — Progress Notes (Signed)
PM note: Hgb after 2 units came up higher than expected to 8.4 g, PM H/H was 7.5 more in line with expectations. Reviewed EGD - duodenal ulcer - dry base. Suspect this as the cause of acute GI bleed.  Plan Continue IV protonix q 12  Con't H/H monitoring  Will advance to full liquid diet  Cardiac - troponin negative x 2. No active chest pain  Plan For risk stratification as an outpatient.   On-Call: [facility] [phone]

## 2013-03-30 NOTE — Plan of Care (Signed)
Problem: Consults Goal: Skin Care Protocol Initiated - if Braden Score 18 or less If consults are not indicated, leave blank or document N/A Outcome: Completed/Met Date Met:  03/30/13 Pt able to turn self, heels floated

## 2013-03-30 NOTE — Progress Notes (Signed)
Subjective: Patient admitted with a GI bleed. She was seen several weeks ago and did report substernal/epigastric pain, usually in the AM. She had been advised to take Zantac150 mg bid. She was also started on prednisone for a rash. By her report she stopped the prednisone on day #3 and only took a few intermittent doses of Zantac. Several days PTA she was feeling weak. She did have a syncopal episode. She also reports that she had substernal chest pain. She presented to the ED and was found to have Hgb 5.1g. She did report episode of hematochezia with dark maroon stools prior to her syncopal episode.   At exam she denies epigastric or substernal pain. She has no respiratory complaints.   Objective: Lab:  Recent Labs  03/29/13 2150  WBC 8.1  NEUTROABS 6.1  HGB 5.1*  HCT 15.2*  MCV 92.7  PLT 196    Recent Labs  03/29/13 2150  NA 132*  K 3.4*  CL 98  GLUCOSE 122*  BUN 14  CREATININE 0.82  CALCIUM 7.8*    Imaging: CXR EXAM:  CHEST 2 VIEW  COMPARISON: 03/17/2007.  FINDINGS:  The cardiac silhouette, mediastinal and hilar contours are within  normal limits and stable. There is tortuosity and calcification of  the thoracic aorta. Stable emphysematous changes and minimal  pulmonary scarring. No acute pulmonary findings. The bony thorax is  intact. Minimal compression deformities in the mid thoracic spine  are noted.  IMPRESSION:  Emphysematous changes but no acute overlying pulmonary process.  Scheduled Meds: . [START ON 04/02/2013] pantoprazole (PROTONIX) IV  40 mg Intravenous Q12H  . sodium chloride  3 mL Intravenous Q12H   Continuous Infusions: . sodium chloride 10 mL/hr at 03/30/13 0219  . pantoprozole (PROTONIX) infusion 8 mg/hr (03/30/13 0300)   PRN Meds:.acetaminophen, acetaminophen, ondansetron (ZOFRAN) IV, ondansetron   Physical Exam: Filed Vitals:   03/30/13 0632  BP: 123/71  Pulse: 86  Temp: 98.4 F (36.9 C)  Resp: 19   Gen'l- slender white woman in  no distress HEENT  C&S clear Cor 2+ radial, quiet precordium, RRR Pul - no increased WOB Abd - BS hypoactive, no guarding or rebound Neuro - awake and alert.      Assessment/Plan: 1. GI - patient with probable brisk upper GI bleed. S/p 2 units PRBCs - follow Hgb pending. She is on protonix infusion  Plan Will keep Hgb 8g +  May need additional transfusion based on pending labs, q8 H/H  GI consult - candidate for EGD  Continue Protonix infusion  2. Cardiac - patient does c/o chest pain - low Hgb causing anemia induced ischemia  Plan Cycle cardiac enzymes  May need outpatient cardiac risk stratification    Illene Regulus [facility] (o) [phone]; (c) [phone] Call-grp - [facility]  Tele: [phone]  03/30/2013, 8:30 AM

## 2013-03-30 NOTE — Op Note (Signed)
[facility] [address] [place], [address]   ENDOSCOPY PROCEDURE REPORT  PATIENT: Cynthia Vargas, Cynthia Vargas.  MR#: [id number] BIRTHDATE: 04/13/1932 , 81  yrs. old GENDER: Female ENDOSCOPIST: Beverley Fiedler, MD REFERRED BY:  [facility] PROCEDURE DATE:  03/30/2013 PROCEDURE:  EGD w/ biopsy for H.pylori ASA CLASS:     Class III INDICATIONS:  Acute post hemorrhagic anemia.   Rectal bleeding. Syncope MEDICATIONS: Fentanyl 25 mcg IV, Versed 4 mg IV, and These medications were titrated to patient response per physician's verbal order TOPICAL ANESTHETIC: Cetacaine Spray  DESCRIPTION OF PROCEDURE: After the risks benefits and alternatives of the procedure were thoroughly explained, informed consent was obtained.  The Pentax Gastroscope [id number] endoscope was introduced through the mouth and advanced to the second portion of the duodenum. Without limitations.  The instrument was slowly withdrawn as the mucosa was fully examined.        ESOPHAGUS: The mucosa of the esophagus appeared normal.   A 2 cm hiatal hernia was noted.  STOMACH: The mucosa of the stomach appeared normal.  Biopsies were taken in the antrum and angularis to exclude H. pylori given acute duodenal ulcer.  DUODENUM: A single round and clean-based ulcer measuring 7 x 10mm in size with surrounding edema was found in the duodenal bulb.   This ulcer was not actively bleeding but there was oozing from the edges with contact.  The duodenal mucosa showed no abnormalities in the 2nd part of the duodenum.  Retroflexed views revealed a hiatal hernia.     The scope was then withdrawn from the patient and the procedure completed.  COMPLICATIONS: There were no complications.  ENDOSCOPIC IMPRESSION: 1.   The mucosa of the esophagus appeared normal 2.   2 cm hiatal hernia 3.   The mucosa of the stomach appeared normal; biopsies were taken in the antrum and angularis 4.   Single ulcer measuring 7 x  10mm in size was found in the duodenal bulb 5.   The duodenal mucosa showed no abnormalities in the 2nd part of the duodenum  RECOMMENDATIONS: 1.  Await pathology results 2.  Follow-up of helicobacter pylori status, treat if indicated 3.  BID PPI for at least 8 weeks 4.  Avoid NSAIDs 5.  Monitor Hgb/HCT to ensure stabilization and improvement  eSigned:  Beverley Fiedler, MD 03/30/2013 3:38 PM   CC:The Patient and Jacques Navy, MD  PATIENT NAME:  Cynthia Vargas, Cynthia Vargas. MR#: [id number]

## 2013-03-30 NOTE — Progress Notes (Signed)
CARE MANAGEMENT NOTE 03/30/2013  Patient:  Cynthia Vargas, Cynthia Vargas   Account Number:  [id number]  Date Initiated:  03/30/2013  Documentation initiated by:  DAVIS,RHONDA  Subjective/Objective Assessment:   pt with active bloody stools, hct 5.6 on admit     Action/Plan:   home when stable   Anticipated DC Date:  04/02/2013   Anticipated DC Plan:  HOME/SELF CARE  In-house referral  NA      DC Planning Services  NA      [facility] Choice  NA   Choice offered to / List presented to:  NA   DME arranged  NA      DME agency  NA     HH arranged  NA      HH agency  NA   Status of service:  In process, will continue to follow Medicare Important Message given?  NA - LOS <3 / Initial given by admissions (If response is "NO", the following Medicare IM given date fields will be blank) Date Medicare IM given:   Date Additional Medicare IM given:    Discharge Disposition:    Per UR Regulation:  Reviewed for med. necessity/level of care/duration of stay  If discussed at Long Length of Stay Meetings, dates discussed:    Comments:  [id number]/Rhonda Stark Jock, BSN, [place] [phone] Chart Reviewed for discharge and hospital needs. Discharge needs at time of review:  None Review of patient progress due on [id number].

## 2013-03-30 NOTE — Progress Notes (Signed)
Report received form ED Nurse at 0045.

## 2013-03-30 NOTE — Consult Note (Signed)
Patient seen, examined, and I agree with the above documentation, including the assessment and plan. Unclear source of bleeding, but concern of recent GI blood loss and profound anemia.  Hgb responded well to 2u pRBC. Now hemodynamically stable Agree with EGD 1st given recent nausea, GERD symptoms and loss of appetite. Further recs thereafter The nature of the procedure, as well as the risks, benefits, and alternatives were carefully and thoroughly reviewed with the patient. Ample time for discussion and questions allowed. The patient understood, was satisfied, and agreed to proceed.

## 2013-03-30 NOTE — H&P (Signed)
[facility] History and Physical  Cynthia Vargas ZOX:[id number] DOB: [birth year] DOA: 03/29/2013  Referring physician: ER physician. PCP: Illene Regulus, MD   Chief Complaint: Weakness and loss of consciousness.  HPI: Cynthia Vargas is a 79 y.o. female with no significant past history was brought to the ER after patient had a brief episode of loss of consciousness yesterday. Patient states that over the last few days she's been feeling weak tired dizzy at times she also gets chest pain and also shortness of breath. 2 days ago she also had frank bleeding from rectum which was self-limited. Last evening patient when she tried to get up from the bed when her home health nurse came she passed out for a few minutes and patient was brought to the ER. CT head was negative for anything acute and patient was nonfocal. ER patient's lab work showed significant anemia and as per the ER physician stool for occult blood was positive. Patient at this time has been admitted for symptomatically we have from GI bleed. Patient was recently placed on prednisone for skin rash otherwise patient states that she has not taken any new medication and she used to take NSAIDs many months ago and not have taken anything recently. Patient states that she has had a colonoscopy about 10 years ago which was unremarkable. Patient's chest pain is episodic. It is retrosternal last a few minutes. She had some chest pain earlier but presently has no chest pain.  Review of Systems: As presented in the history of presenting illness, rest negative.  Past Medical History  Diagnosis Date  . CALCANEAL FRACTURE, RIGHT 11/04/2007  . DILATION AND CURETTAGE, HX OF 11/04/2007  . KNEE PAIN, RIGHT, CHRONIC 03/19/2010  . OSTEOPOROSIS 05/02/2007  . PEPTIC ULCER DISEASE 05/02/2007  . Personal History of Other Diseases of Digestive Disease 11/04/2007  . Unspecified essential hypertension 05/02/2007   Past Surgical History  Procedure  Laterality Date  . Dilation and curettage of uterus    . Cholecystectomy    . Tonsillectomy    . Calcaneal fracture, right    . Orif hip fracture     Social History:  reports that she has never smoked. She has never used smokeless tobacco. She reports that she does not drink alcohol or use illicit drugs. Home. where does patient live-- Yes. Can patient participate in ADLs?  Allergies  Allergen Reactions  . Peanut-Containing Drug Products Other (See Comments)    angioedema    Family History  Problem Relation Age of Onset  . Cancer Father   . Heart disease Mother   . Diabetes Neg Hx   . Hyperlipidemia Neg Hx   . Hypertension Neg Hx       Prior to Admission medications   Medication Sig Start Date End Date Taking? Authorizing Provider  acetaminophen (TYLENOL) 500 MG tablet Take 1,000 mg by mouth as needed.    Yes Historical Provider, MD  dexamethasone (DECADRON) 0.1 % ophthalmic suspension Place 1 drop into both eyes every 6 (six) hours. 01/13/13  Yes Etta Grandchild, MD  fexofenadine (ALLEGRA) 60 MG tablet Take 60 mg by mouth daily.     Yes Historical Provider, MD  meloxicam (MOBIC) 7.5 MG tablet Take 7.5 mg by mouth daily. Take 1 tablet a day as needed for pain and swelling. 07/30/12  Yes Historical Provider, MD  predniSONE (DELTASONE) 10 MG tablet Take 1 tablet (10 mg total) by mouth daily. 3 tabs daily for 3 days; 2 tabs daily for 3  days; 1 tab daily for 6 days 03/24/13  Yes Jacques Navy, MD   Physical Exam: Filed Vitals:   03/29/13 2117 03/29/13 2133  BP:  110/61  Pulse:  106  Temp:  98.4 F (36.9 C)  TempSrc:  Oral  Resp:  18  SpO2: 100% 100%     General:  Well-developed and moderately nourished.  Eyes: Pallor present.  ENT: No discharge from ears eyes nose mouth.  Neck: No mass felt.  Cardiovascular: S1-S2 heard.  Respiratory: No rhonchi or crepitations.  Abdomen: Soft nontender bowel sounds present.  Skin: Pale-looking.  Musculoskeletal: No  edema.  Psychiatric: Appears normal.  Neurologic:  Alert awake oriented to time place and person. Moves all extremities.  Labs on Admission:  Basic Metabolic Panel:  Recent Labs Lab 03/29/13 2150  NA 132*  K 3.4*  CL 98  CO2 26  GLUCOSE 122*  BUN 14  CREATININE 0.82  CALCIUM 7.8*   Liver Function Tests: No results found for this basename: AST, ALT, ALKPHOS, BILITOT, PROT, ALBUMIN,  in the last 168 hours No results found for this basename: LIPASE, AMYLASE,  in the last 168 hours No results found for this basename: AMMONIA,  in the last 168 hours CBC:  Recent Labs Lab 03/29/13 2150  WBC 8.1  NEUTROABS 6.1  HGB 5.1*  HCT 15.2*  MCV 92.7  PLT 196   Cardiac Enzymes: No results found for this basename: CKTOTAL, CKMB, CKMBINDEX, TROPONINI,  in the last 168 hours  BNP (last 3 results) No results found for this basename: PROBNP,  in the last 8760 hours CBG: No results found for this basename: GLUCAP,  in the last 168 hours  Radiological Exams on Admission: Dg Chest 2 View  03/29/2013   CLINICAL DATA:  Fever and weakness.  EXAM: CHEST  2 VIEW  COMPARISON:  03/17/2007.  FINDINGS: The cardiac silhouette, mediastinal and hilar contours are within normal limits and stable. There is tortuosity and calcification of the thoracic aorta. Stable emphysematous changes and minimal pulmonary scarring. No acute pulmonary findings. The bony thorax is intact. Minimal compression deformities in the mid thoracic spine are noted.  IMPRESSION: Emphysematous changes but no acute overlying pulmonary process.   Electronically Signed   By: Loralie Champagne M.D.   On: 03/29/2013 22:15   Ct Head Wo Contrast  03/29/2013   CLINICAL DATA:  Jaundice weakness, syncope.  EXAM: CT HEAD WITHOUT CONTRAST  TECHNIQUE: Contiguous axial images were obtained from the base of the skull through the vertex without intravenous contrast.  COMPARISON:  None  FINDINGS: Extensive white matter disease throughout the deep and  periventricular white matter. No acute intracranial abnormality. Specifically, no hemorrhage, hydrocephalus, mass lesion, acute infarction, or significant intracranial injury. No acute calvarial abnormality. Visualized paranasal sinuses and mastoids clear. Orbital soft tissues unremarkable.  IMPRESSION: Extensive white matter changes, likely chronic ischemic changes. No acute findings.   Electronically Signed   By: Charlett Nose M.D.   On: 03/29/2013 22:22    EKG: Independently reviewed. Normal sinus rhythm.  Assessment/Plan Principal Problem:   GI bleed Active Problems:   Anemia   1. Symptomatic anemia secondary to GI bleed - check anemia panel. Patient is to receive 2 units of packed red blood cells. We'll keep patient in anticipation of GI procedures. Patient has been placed on Protonix infusion. Not clear if patient for lower GI bleed.    Code Status: Full code.  Family Communication: None.  Disposition Plan: Admit to inpatient under Dr.  Norrins.    Laquanda Bick N. [facility] Pager [phone].  If 7PM-7AM, please contact night-coverage [email or web address] Password [email or web address] 03/30/2013, 12:16 AM

## 2013-03-31 ENCOUNTER — Encounter (HOSPITAL_COMMUNITY): Payer: Self-pay | Admitting: Internal Medicine

## 2013-03-31 LAB — URINE CULTURE
Colony Count: 70000
Special Requests: NORMAL

## 2013-03-31 LAB — HEMOGLOBIN AND HEMATOCRIT, BLOOD
HCT: 23.8 % — ABNORMAL LOW (ref 36.0–46.0)
HCT: 26.2 % — ABNORMAL LOW (ref 36.0–46.0)
HCT: 26.7 % — ABNORMAL LOW (ref 36.0–46.0)
Hemoglobin: 8.2 g/dL — ABNORMAL LOW (ref 12.0–15.0)
Hemoglobin: 8.7 g/dL — ABNORMAL LOW (ref 12.0–15.0)
Hemoglobin: 8.9 g/dL — ABNORMAL LOW (ref 12.0–15.0)

## 2013-03-31 LAB — TYPE AND SCREEN
ABO/RH(D): A POS
Antibody Screen: NEGATIVE
Unit division: 0
Unit division: 0

## 2013-03-31 LAB — GLUCOSE, CAPILLARY
Glucose-Capillary: 91 mg/dL (ref 70–99)
Glucose-Capillary: 96 mg/dL (ref 70–99)

## 2013-03-31 MED ORDER — ALUM & MAG HYDROXIDE-SIMETH 200-200-20 MG/5ML PO SUSP: 30.0000 mL | ORAL | Status: DC | PRN

## 2013-03-31 MED ORDER — PANTOPRAZOLE SODIUM 40 MG PO TBEC
40.0000 mg | DELAYED_RELEASE_TABLET | Freq: Two times a day (BID) | ORAL | Status: DC
  Administered 2013-03-31 – 2013-04-01 (×3): 40 mg via ORAL
  Filled 2013-03-31 (×4): qty 1

## 2013-03-31 NOTE — Progress Notes (Addendum)
Patient seen, examined, and I agree with the above documentation, including the assessment and plan. Awaiting Bx results to exclude H pylori Hgb is stable, epigastric pain is better.  Tolerating diet If Hgb stable tomorrow, expect home soon

## 2013-03-31 NOTE — Progress Notes (Signed)
[facility] Progress Note  Subjective:  Feels better today.  No BM.  No abdominal pain. Ate and tolerated breakfast.  Objective:  Vital signs in last 24 hours: Temp:  [97.5 F (36.4 C)-98.9 F (37.2 C)] 98.8 F (37.1 C) (10/01 0800) Pulse Rate:  [74-98] 85 (10/01 0800) Resp:  [11-22] 16 (10/01 0800) BP: (88-146)/(49-79) 146/68 mmHg (10/01 0800) SpO2:  [96 %-100 %] 97 % (10/01 0800) Weight:  [109 lb 2 oz (49.5 kg)] 109 lb 2 oz (49.5 kg) (10/01 0520) Last BM Date: 03/28/13 General:   Alert, Well-developed, in NAD Heart:  Regular rate and rhythm; no murmurs Pulm:  CTAB.  No W/R/R. Abdomen:  Soft, nontender and nondistended. Normal bowel sounds, without guarding, and without rebound.   Extremities:  Without edema. Neurologic:  Alert and  oriented x4;  grossly normal neurologically. Psych:  Alert and cooperative. Normal mood and affect.  Intake/Output from previous day: 09/30 0701 - 10/01 0700 In: 454.9 [P.O.:240; I.V.:214.9] Out: 2425 [Urine:2425]  Lab Results:  Recent Labs  03/29/13 2150 03/30/13 0825 03/30/13 1600 03/31/13 0100  WBC 8.1 5.9  --   --   HGB 5.1* 8.4* 7.5* 8.2*  HCT 15.2* 24.5* 22.0* 23.8*  PLT 196 170  --   --    BMET  Recent Labs  03/29/13 2150 03/30/13 0825  NA 132* 135  K 3.4* 3.3*  CL 98 104  CO2 26 24  GLUCOSE 122* 100*  BUN 14 11  CREATININE 0.82 0.71  CALCIUM 7.8* 7.5*   Dg Chest 2 View  03/29/2013   CLINICAL DATA:  Fever and weakness.  EXAM: CHEST  2 VIEW  COMPARISON:  03/17/2007.  FINDINGS: The cardiac silhouette, mediastinal and hilar contours are within normal limits and stable. There is tortuosity and calcification of the thoracic aorta. Stable emphysematous changes and minimal pulmonary scarring. No acute pulmonary findings. The bony thorax is intact. Minimal compression deformities in the mid thoracic spine are noted.  IMPRESSION: Emphysematous changes but no acute overlying pulmonary process.   Electronically Signed    By: Loralie Champagne M.D.   On: 03/29/2013 22:15   Ct Head Wo Contrast  03/29/2013   CLINICAL DATA:  Jaundice weakness, syncope.  EXAM: CT HEAD WITHOUT CONTRAST  TECHNIQUE: Contiguous axial images were obtained from the base of the skull through the vertex without intravenous contrast.  COMPARISON:  None  FINDINGS: Extensive white matter disease throughout the deep and periventricular white matter. No acute intracranial abnormality. Specifically, no hemorrhage, hydrocephalus, mass lesion, acute infarction, or significant intracranial injury. No acute calvarial abnormality. Visualized paranasal sinuses and mastoids clear. Orbital soft tissues unremarkable.  IMPRESSION: Extensive white matter changes, likely chronic ischemic changes. No acute findings.   Electronically Signed   By: Charlett Nose M.D.   On: 03/29/2013 22:22    Assessment / Plan: -DU:  Clean based.  Seen on EGD 9/30. -UGIB, secondary to above:  Now resolved. -Acute blood loss anemia, secondary to above:  S/p two units PRBC's.  Hgb improved and stable at 8.7 grams this AM.    *Continue BID PPI x 2 months. *No NSAID's. *Continue to monitor Hgb. *Follow-up biopsy results.  If Hpylori is positive then will treat.   LOS: 2 days   Chemika Nightengale D.  03/31/2013, 8:50 AM  Pager number [phone]

## 2013-03-31 NOTE — Progress Notes (Signed)
Pt received in room 1516. Alert and oriented x 4. Denies any needs at this time. Pt oriented to room and encouraged to order lunch. Pt is comfortable. Vwilliams,rn.

## 2013-03-31 NOTE — Progress Notes (Signed)
Nutrition Brief Note  Patient identified on the Malnutrition Screening Tool (MST) Report  Wt Readings from Last 15 Encounters:  03/31/13 109 lb 2 oz (49.5 kg)  03/31/13 109 lb 2 oz (49.5 kg)  03/24/13 108 lb (48.988 kg)  01/13/13 106 lb (48.081 kg)  08/03/12 106 lb 1.9 oz (48.136 kg)  07/29/11 106 lb 8 oz (48.308 kg)  01/25/11 108 lb (48.988 kg)  03/19/10 106 lb (48.081 kg)  11/05/07 111 lb (50.349 kg)    Body mass index is 19.63 kg/(m^2). Patient meets criteria for healthy weight based on current BMI.   Current diet order is regular, patient is consuming approximately regular 100% of meals at this time. Labs and medications reviewed. Potassium slightly low. Met with pt who reports eating well with good appetite and stable weight PTA. States she tries to eat a low fat diet with fresh fruits and vegetables. Denies any diarrhea, nausea, or heartburn.    No nutrition interventions warranted at this time. If nutrition issues arise, please consult RD.   Levon Hedger MS, RD, LDN [phone] Pager [phone] After Hours Pager

## 2013-03-31 NOTE — Progress Notes (Signed)
Subjective: Feelilng better. She does complain of a gnawing sensation in the epigastrium. She denies any recurrent hematochezia. Her appetite is OK and she has tolerated full liquids  Objective: Lab:  Recent Labs  03/29/13 2150 03/30/13 0825 03/30/13 1600 03/31/13 0100  WBC 8.1 5.9  --   --   NEUTROABS 6.1  --   --   --   HGB 5.1* 8.4* 7.5* 8.2*  HCT 15.2* 24.5* 22.0* 23.8*  MCV 92.7 94.2  --   --   PLT 196 170  --   --     Recent Labs  03/29/13 2150 03/30/13 0825  NA 132* 135  K 3.4* 3.3*  CL 98 104  GLUCOSE 122* 100*  BUN 14 11  CREATININE 0.82 0.71  CALCIUM 7.8* 7.5*    Imaging: no new imaging  Scheduled Meds: . pantoprazole (PROTONIX) IV  40 mg Intravenous Q12H  . sodium chloride  3 mL Intravenous Q12H  . sucralfate  1 g Oral TID WC & HS   Continuous Infusions: . sodium chloride Stopped (03/30/13 1135)  . sodium chloride Stopped (03/30/13 1405)   PRN Meds:.acetaminophen, acetaminophen, ondansetron (ZOFRAN) IV, ondansetron   Physical Exam: Filed Vitals:   03/31/13 0800  BP: 146/68  Pulse: 85  Temp: 98.8 F (37.1 C)  Resp: 16   Gen'l - slender woman in no distress HEENT- C&S pale Cor 2+ radial, RRR Pulm - normal respirations Abd - BS+ x 4, tender in the epigastrium and RUQ Neuro - A&O x 3      Assessment/Plan: 1. GI - stable Hgb. Minimal discomfort Plan Continue protonix but convert to PO  Liquid antacid q 4 prn.  F/u H/H -   Transfer to med/surg bed  2. Cardiac no recurrent chest pain Plan Can come off tele   Illene Regulus [facility] (o) [phone]; (c) [phone] Call-grp - P[facility] IM  Tele: [phone]  03/31/2013, 8:43 AM

## 2013-04-01 ENCOUNTER — Other Ambulatory Visit: Payer: Self-pay | Admitting: Internal Medicine

## 2013-04-01 ENCOUNTER — Telehealth: Payer: Self-pay | Admitting: Internal Medicine

## 2013-04-01 DIAGNOSIS — K279 Peptic ulcer, site unspecified, unspecified as acute or chronic, without hemorrhage or perforation: Secondary | ICD-10-CM

## 2013-04-01 LAB — HEMOGLOBIN AND HEMATOCRIT, BLOOD
HCT: 25.6 % — ABNORMAL LOW (ref 36.0–46.0)
Hemoglobin: 8.5 g/dL — ABNORMAL LOW (ref 12.0–15.0)

## 2013-04-01 MED ORDER — ALUM & MAG HYDROXIDE-SIMETH 200-200-20 MG/5ML PO SUSP: 30.0000 mL | ORAL | Status: DC | PRN

## 2013-04-01 MED ORDER — PANTOPRAZOLE SODIUM 40 MG PO TBEC: 40.0000 mg | DELAYED_RELEASE_TABLET | Freq: Two times a day (BID) | ORAL | Status: DC

## 2013-04-01 NOTE — Progress Notes (Signed)
Subjective: Feeling well and ready to go home  Objective: Lab:  Recent Labs  03/29/13 2150 03/30/13 0825  03/31/13 0100 03/31/13 0835 03/31/13 2046  WBC 8.1 5.9  --   --   --   --   NEUTROABS 6.1  --   --   --   --   --   HGB 5.1* 8.4*  < > 8.2* 8.7* 8.9*  HCT 15.2* 24.5*  < > 23.8* 26.2* 26.7*  MCV 92.7 94.2  --   --   --   --   PLT 196 170  --   --   --   --   < > = values in this interval not displayed.  Recent Labs  03/29/13 2150 03/30/13 0825  NA 132* 135  K 3.4* 3.3*  CL 98 104  GLUCOSE 122* 100*  BUN 14 11  CREATININE 0.82 0.71  CALCIUM 7.8* 7.5*    Imaging:  Scheduled Meds: . pantoprazole  40 mg Oral BID  . sodium chloride  3 mL Intravenous Q12H  . sucralfate  1 g Oral TID WC & HS   Continuous Infusions:  PRN Meds:.acetaminophen, acetaminophen, alum & mag hydroxide-simeth, ondansetron (ZOFRAN) IV, ondansetron   Physical Exam: Filed Vitals:   04/01/13 0828  BP: 138/81  Pulse: 83  Temp: 98.3 F (36.8 C)  Resp:   see d/c summary      Assessment/Plan: No signs of bleeding, appears stable. For d/c home - dictated #[id number]   Illene Regulus [facility] (o) [phone]; (c) [phone] Call-grp - [facility]  Tele: [phone]  04/01/2013, 8:40 AM

## 2013-04-01 NOTE — Telephone Encounter (Signed)
Tried calling patient to schedule, no answer and voice mail is full.  Will try calling back later this afternoon or tomorrow.  Thanks!

## 2013-04-01 NOTE — Progress Notes (Signed)
Patient doing well. Stable Hgb. For discharge home. See D/C summary

## 2013-04-01 NOTE — Telephone Encounter (Signed)
Message copied by Newell Coral on Thu Apr 01, 2013  9:32 AM ------      Message from: Illene Regulus E      Created: Thu Apr 01, 2013  8:35 AM       1. Transition of care - Duodenal ulcer with GI bleed.            2. Office visit next Thursday, October 9th AM appointment            [facility] ------

## 2013-04-01 NOTE — Discharge Summary (Addendum)
Cynthia Vargas, CAHN NO.:  [id number]  MEDICAL RECORD NO.:  [id number]  LOCATION:  1516                         FACILITY:  [facility]  PHYSICIAN:  Rosalyn Gess. Naw Lasala, MD  DATE OF BIRTH:  [birth year]  DATE OF ADMISSION:  03/29/2013 DATE OF DISCHARGE:  04/01/2013                              DISCHARGE SUMMARY   ADMITTING DIAGNOSES: 1. Syncope. 2. Acute anemia from upper GI bleed.  DISCHARGE DIAGNOSIS:  Duodenal ulcer with acute upper GI bleed.  CONSULTANTS:  Dr. Rhea Belton for GI.  PROCEDURES: 1. Imaging, 2-view of the chest March 29, 2013, emphysematous     changes with no acute overlying pulmonary process. 2. CT head without contrast, September 29 which showed extensive white     matter changes likely chronic ischemic changes.  No acute findings     were noted. 3. Upper endoscopy performed March 30, 2013, which revealed the     esophagus to be normal.  There is a 2 cm hiatal hernia.  Stomach     with normal appearing mucosa.  Biopsies were taken to exclude H.     pylori.  Duodenum with a single round clean based ulcer measuring 7     x 10 mm in size with surrounding edema in the duodenal bulb.  This     lesion was not actively bleeding, but there was oozing from the     edges with contact.  No abnormalities in the 2nd part of the     duodenum.  HISTORY OF PRESENT ILLNESS:  Ms. Wery is a very pleasant 79 year old woman, who was seen in the office 2 weeks prior to admission for a skin rash for which she was started on prednisone.  At that time, she did complain of epigastric discomfort and substernal burning.  She was started on Zantac 150 mg b.i.d.  The patient did not take Zantac as directed.  She began to have increasing problems, feeling weak and dizzy with continued abdominal pain and discomfort.  The patient developed increasing dyspnea on exertion.  Two days prior to admission, she admitted to having frank bleeding from the rectum which she  described as being dark maroon-colored stools but no clots.  On the evening prior to admission when the patient tried to arise from bed when the home health nurse came to visit, she passed out spontaneously and was subsequently brought to the emergency department for evaluation.  The patient's CT of the head was unremarkable.  Lab work did reveal the patient to have a very significant anemia with a hemoglobin of 5.1 g down from her baseline of 12.  Stool is heme-positive.  The patient was admitted with a symptomatic GI bleed most likely from the upper GI system.  Please see the H and P for past medical history, family history, social history, and admission exam.  HOSPITAL COURSE: 1. Syncope.  The patient had anemia related syncope.  CT scan of the     brain was normal.  Neurologic findings were unremarkable. 2. GI.  The patient with an upper GI bleed.  She was seen by the GI     Service and taken for endoscopy with results as above.  The patient  was transfused 2 units of packed red cells with an abnormal     response with her hemoglobin going from 5.1 g to 8.4 g.  At     endoscopy, the patient did not seem to have active bleeding for     duodenal ulcer.  Serial H and H continued to be positive.  There     was a slight drop to 7.5 g and then continuously rising H and H     with the last available study from October 1st at 20:47 hours at     8.7 g.  The patient reports she has had no recurrent bleeding.  She     has been able to take a diet without difficulty.  She does not have     any complaints of being short of breath or lightheaded.  She does     complain of some mild substernal discomfort described as a burning     as well as some mild epigastric gnawing.  She has been getting     Protonix 40 mg b.i.d. and Carafate 1 g before meals and at bedtime.     Liquids and solids were added.  On the morning of discharge, the     patient was feeling well.  She has had no recurrent  bleeding.  She     has had no recurrent abdominal pain other than as noted above and     at this time for hemoglobin that is pending remains stable.  She     will be ready for discharge to home 3. Cardiac.  The patient did have symptoms suggestive of possible     atypical angina.  She does have a positive family history for     coronary artery disease.  The patient did have troponins cycled     that were negative x3.  EKG was unremarkable with no acute     findings.  PLAN:  The patient will be seen as an outpatient and will be scheduled for risk stratification with Myoview.  DISCHARGE PHYSICAL EXAMINATION:  VITAL SIGNS:  Temperature was 97.7, blood pressure 128/79, heart rate 77, respirations were 16.  Oxygen saturation was 98% on room air. GENERAL APPEARANCE:  This is a pleasant woman looking younger than her stated chronologic age of 79, is in no acute distress. HEENT:  Conjunctivae and sclerae without icterus.  Pupils equal, round, and reactive. CHEST:  The patient has no chest deformities. PULMONARY:  The patient has no increased work of breathing.  No rales, wheezes, or rhonchi are appreciated. CARDIOVASCULAR:  The patient has 2+ radial pulse.  Her precordium was quiet.  She had a regular rate and rhythm.  I did not appreciate any murmurs.  ABDOMEN:  The patient has positive bowel sounds in all 4 quadrants.  She has mild generalized guarding, but with coaching she was able to relax adequately for palpation and there was no significant tenderness on exam. NEUROLOGICAL:  The patient is intact, awake, alert and oriented.  FINAL LABORATORY:  Final chemistry panel from September 30 with a sodium 135, potassium 3.3, chloride 104, CO2 of 24, BUN of 11, creatinine 0.71, glucose was 100.  Cardiac enzymes:  Troponin I was less than 0.30 x3. Iron panel:  The patient's iron was 47 which is in normal range.  TIBC was 216 which is slightly low.  Iron saturation was 22% which was in normal  range.  Ferritin was 32 and normal.  The patient's B12 level was 295,  which is in normal range.  Hematology:  The patient's final hemoglobin that is available from the October 1st at 2046 hours was 8.9 g.  DISCHARGE MEDICATIONS: 1. Tylenol 1000 mg q.8 hours p.r.n. 2. Mylanta or equivalent product 30 mL every 4 hours as needed. 3. Dexamethasone 0.1% ophthalmic suspension 1 drop to both eyes every     6 hours as instructed. 4. Allegra 60 mg daily for allergies. 5. The patient is instructed to discontinue meloxicam. 6. Protonix 40 mg b.i.d. 7. Prednisone is discontinued.  DISPOSITION:  The patient is stable and ready for discharge to home.  We will hold discharge until morning hemoglobin has returned and the patient has been able to ambulate with supervision to be sure she is stable.  FOLLOWUP:  The patient will be seen in the office in 1 week.  She will return 1 day prior to her office visit for a repeat hemoglobin and hematocrit.  The patient is instructed to contact the office for any problems that should arise between now and her office followup.  The patient's condition at time of discharge dictation is medically stable and improved.     Rosalyn Gess Aarsh Fristoe, MD     MEN/MEDQ  D:  04/01/2013  T:  04/01/2013  Job:  [id number]  cc:   Erick Blinks, MD Fax: [phone]

## 2013-04-01 NOTE — Progress Notes (Signed)
Pt ready for d/c. This RN went over d/c paperwork with pt. Instructed to avoid NSAIDs and to stop taking prednisone. PIV removed, WNL. No change in pt condition since AM assessment. Pt d/c'd to home via friend. Eugene Garnet RN

## 2013-04-01 NOTE — Progress Notes (Signed)
Gastric biopsy NEGATIVE for H. Pylori.  Duodenal ulcer presumed to be secondary to NSAIDs and recent steroid use. Continue BID PPI for at least 8 weeks, and consider daily thereafter given her hx of PUD on more than 1 occasion

## 2013-04-07 ENCOUNTER — Other Ambulatory Visit (INDEPENDENT_AMBULATORY_CARE_PROVIDER_SITE_OTHER): Payer: Medicare Other

## 2013-04-07 DIAGNOSIS — K279 Peptic ulcer, site unspecified, unspecified as acute or chronic, without hemorrhage or perforation: Secondary | ICD-10-CM

## 2013-04-07 DIAGNOSIS — T887XXA Unspecified adverse effect of drug or medicament, initial encounter: Secondary | ICD-10-CM

## 2013-04-07 LAB — COMPREHENSIVE METABOLIC PANEL
ALT: 16 U/L (ref 0–35)
AST: 19 U/L (ref 0–37)
Albumin: 3.2 g/dL — ABNORMAL LOW (ref 3.5–5.2)
Alkaline Phosphatase: 54 U/L (ref 39–117)
BUN: 14 mg/dL (ref 6–23)
CO2: 28 mEq/L (ref 19–32)
Calcium: 8.5 mg/dL (ref 8.4–10.5)
Chloride: 104 mEq/L (ref 96–112)
Creatinine, Ser: 0.9 mg/dL (ref 0.4–1.2)
GFR: 61.78 mL/min (ref >60.00)
Glucose, Bld: 96 mg/dL (ref 70–99)
Potassium: 4.2 mEq/L (ref 3.5–5.1)
Sodium: 141 mEq/L (ref 135–145)
Total Bilirubin: 0.4 mg/dL (ref 0.3–1.2)
Total Protein: 5.8 g/dL — ABNORMAL LOW (ref 6.0–8.3)

## 2013-04-07 LAB — HEMOGLOBIN AND HEMATOCRIT, BLOOD
HCT: 23.8 % — CL (ref 36.0–46.0)
Hemoglobin: 7.9 g/dL — CL (ref 12.0–15.0)

## 2013-04-08 ENCOUNTER — Ambulatory Visit (INDEPENDENT_AMBULATORY_CARE_PROVIDER_SITE_OTHER): Payer: Medicare Other | Admitting: Internal Medicine

## 2013-04-08 ENCOUNTER — Encounter: Payer: Self-pay | Admitting: Internal Medicine

## 2013-04-08 VITALS — BP 140/90 | HR 82 | Temp 98.8°F | Wt 110.4 lb

## 2013-04-08 DIAGNOSIS — K279 Peptic ulcer, site unspecified, unspecified as acute or chronic, without hemorrhage or perforation: Secondary | ICD-10-CM

## 2013-04-08 NOTE — Progress Notes (Signed)
Subjective:    Patient ID: Cynthia Vargas, female    DOB: January 18, 1932, 79 y.o.   MRN: [id number]  HPI Cynthia Vargas was recently hospitalized for a UGI bleed with a duodenal ulcer. She did receive 2 units PRBCs and did well: Hgb 5.1-8.4 at discharge. Since being home eating well, not feeling light-headed, normal BMs- not dark or tarry. Her Hgb 10/8 was 7.9 g - more in line with expectations after only 2 units of blood.   Past Medical History  Diagnosis Date  . CALCANEAL FRACTURE, RIGHT 11/04/2007  . DILATION AND CURETTAGE, HX OF 11/04/2007  . KNEE PAIN, RIGHT, CHRONIC 03/19/2010  . OSTEOPOROSIS 05/02/2007  . PEPTIC ULCER DISEASE 05/02/2007  . Personal History of Other Diseases of Digestive Disease 11/04/2007  . Unspecified essential hypertension 05/02/2007   Past Surgical History  Procedure Laterality Date  . Dilation and curettage of uterus    . Cholecystectomy    . Tonsillectomy    . Calcaneal fracture, right    . Orif hip fracture    . Esophagogastroduodenoscopy N/A 03/30/2013    Procedure: ESOPHAGOGASTRODUODENOSCOPY (EGD);  Surgeon: Beverley Fiedler, MD;  Location: [facility];  Service: Endoscopy;  Laterality: N/A;   Family History  Problem Relation Age of Onset  . Cancer Father   . Heart disease Mother   . Diabetes Neg Hx   . Hyperlipidemia Neg Hx   . Hypertension Neg Hx    History   Social History  . Marital Status: Single    Spouse Name: N/A    Number of Children: 1  . Years of Education: 16   Occupational History  . reitred    Social History Main Topics  . Smoking status: Never Smoker   . Smokeless tobacco: Never Used  . Alcohol Use: No  . Drug Use: No  . Sexual Activity: No   Other Topics Concern  . Not on file   Social History Narrative   [employer]'s degrees in Ambulance person. married for 2 years then single . 1 son - Cynthia Vargas. 2 daughters-put up for adoption. Lives alone and is independent in ADL's. works at [employer] until injured.    Current Outpatient  Prescriptions on File Prior to Visit  Medication Sig Dispense Refill  . acetaminophen (TYLENOL) 500 MG tablet Take 1,000 mg by mouth as needed.       [name] alum & mag hydroxide-simeth (MAALOX/MYLANTA) 200-200-20 MG/5ML suspension Take 30 mLs by mouth every 4 (four) hours as needed.  355 mL  0  . dexamethasone (DECADRON) 0.1 % ophthalmic suspension Place 1 drop into both eyes every 6 (six) hours.  15 mL  0  . fexofenadine (ALLEGRA) 60 MG tablet Take 60 mg by mouth daily.        . pantoprazole (PROTONIX) 40 MG tablet Take 1 tablet (40 mg total) by mouth 2 (two) times daily.  60 tablet  3   No current facility-administered medications on file prior to visit.      Review of Systems Constitutional:  Negative for fever, chills, activity change and unexpected weight change.  HEENT:  Negative for hearing loss, ear pain, congestion, neck stiffness and postnasal drip. Negative for sore throat or swallowing problems. Negative for dental complaints.   Eyes: Negative for vision loss or change in visual acuity.  Respiratory: Negative for chest tightness and wheezing. Negative for DOE.   Cardiovascular: Negative for chest pain or palpitations. No decreased exercise tolerance Gastrointestinal: No change in bowel habit. No bloating or  gas. No reflux or indigestion Genitourinary: Negative for urgency, frequency, flank pain and difficulty urinating.  Musculoskeletal: Negative for myalgias, back pain, arthralgias and gait problem.  Neurological: Negative for dizziness, tremors, weakness and headaches.  Hematological: Negative for adenopathy.  Psychiatric/Behavioral: Negative for behavioral problems and dysphoric mood.       Objective:   Physical Exam Filed Vitals:   04/08/13 1210  BP: 140/90  Pulse: 82  Temp: 98.8 F (37.1 C)   Gen'l- Slender woman in no distress HEENT - C&S clear Cor - 2+ radial, RRR Pulm - normal respirations Abd - BS+, non-tender Neuro - A&O x 3       Assessment & Plan:

## 2013-04-08 NOTE — Patient Instructions (Signed)
You look good - much more color in your complexion. I am glad that you feel good too.  Your Hgb 10/8 was 7.9 - this is much more in line with the amount of blood you received than 8.4g at hospital discharge  Plan Continue protonix 40 twice a day for a full month post-discharge, then reduce to one every AM before breakfast  Iron rich diet plus Iron supplement - ask the pharmacist to direct you to a product that provided 325 mg iron  Return office visit in 2 months for follow up, med adjustment and follow up lab  Call sooner for any problems.   Iron-Rich Diet An iron-rich diet contains foods that are good sources of iron. Iron is an important mineral that helps your body produce hemoglobin. Hemoglobin is a protein in red blood cells that carries oxygen to the body's tissues. Sometimes, the iron level in your blood can be low. This may be caused by:  A lack of iron in your diet.  Blood loss.  Times of growth, such as during pregnancy or during a child's growth and development. Low levels of iron can cause a decrease in the number of red blood cells. This can result in iron deficiency anemia. Iron deficiency anemia symptoms include:  Tiredness.  Weakness.  Irritability.  Increased chance of infection. Here are some recommendations for daily iron intake:  Males older than 77 years of age need 8 mg of iron per day.  Women ages 82 to 1 need 18 mg of iron per day.  Pregnant women need 27 mg of iron per day, and women who are over 71 years of age and breastfeeding need 9 mg of iron per day.  Women over the age of 40 need 8 mg of iron per day. SOURCES OF IRON There are 2 types of iron that are found in food: heme iron and nonheme iron. Heme iron is absorbed by the body better than nonheme iron. Heme iron is found in meat, poultry, and fish. Nonheme iron is found in grains, beans, and vegetables. Heme Iron Sources Food / Iron (mg)  Chicken liver, 3 oz (85 g)/ 10 mg  Beef liver, 3 oz  (85 g)/ 5.5 mg  Oysters, 3 oz (85 g)/ 8 mg  Beef, 3 oz (85 g)/ 2 to 3 mg  Shrimp, 3 oz (85 g)/ 2.8 mg  [place], 3 oz (85 g)/ 2 mg  Chicken, 3 oz (85 g) / 1 mg  Fish (tuna, halibut), 3 oz (85 g)/ 1 mg  Pork, 3 oz (85 g)/ 0.9 mg Nonheme Iron Sources Food / Iron (mg)  Ready-to-eat breakfast cereal, iron-fortified / 3.9 to 7 mg  Tofu,  cup / 3.4 mg  Kidney beans,  cup / 2.6 mg  Baked potato with skin / 2.7 mg  Asparagus,  cup / 2.2 mg  Avocado / 2 mg  Dried peaches,  cup / 1.6 mg  Raisins,  cup / 1.5 mg  Soy milk, 1 cup / 1.5 mg  Whole-wheat bread, 1 slice / 1.2 mg  Spinach, 1 cup / 0.8 mg  Broccoli,  cup / 0.6 mg IRON ABSORPTION Certain foods can decrease the body's absorption of iron. Try to avoid these foods and beverages while eating meals with iron-containing foods:  Coffee.  Tea.  Fiber.  Soy. Foods containing vitamin C can help increase the amount of iron your body absorbs from iron sources, especially from nonheme sources. Eat foods with vitamin C along with iron-containing foods  to increase your iron absorption. Foods that are high in vitamin C include many fruits and vegetables. Some good sources are:  Fresh orange juice.  Oranges.  Strawberries.  Mangoes.  Grapefruit.  Red bell peppers.  Green bell peppers.  Broccoli.  Potatoes with skin.  Tomato juice. Document Released: 01/29/2005 Document Revised: 09/09/2011 Document Reviewed: 12/06/2010 [facility] Patient Information 2014 [place], [place].

## 2013-04-11 NOTE — Assessment & Plan Note (Signed)
Sept '14: admission for UGI bleed: duodenal ulcer by EGD. Follow up Hgb in line with amount of blood transfused. She has done well since being home w/ no signs of bleeding.  Plan Continue PPI therapy BID for a full month, the daily for a month and will then reassess therapy.

## 2013-04-30 DIAGNOSIS — K279 Peptic ulcer, site unspecified, unspecified as acute or chronic, without hemorrhage or perforation: Secondary | ICD-10-CM

## 2013-05-24 ENCOUNTER — Encounter: Payer: Self-pay | Admitting: Internal Medicine

## 2013-05-24 ENCOUNTER — Other Ambulatory Visit (INDEPENDENT_AMBULATORY_CARE_PROVIDER_SITE_OTHER): Payer: Medicare Other

## 2013-05-24 ENCOUNTER — Ambulatory Visit (INDEPENDENT_AMBULATORY_CARE_PROVIDER_SITE_OTHER): Payer: Medicare Other | Admitting: Internal Medicine

## 2013-05-24 VITALS — BP 110/72 | HR 78 | Temp 97.9°F | Wt 109.0 lb

## 2013-05-24 DIAGNOSIS — D649 Anemia, unspecified: Secondary | ICD-10-CM

## 2013-05-24 DIAGNOSIS — K279 Peptic ulcer, site unspecified, unspecified as acute or chronic, without hemorrhage or perforation: Secondary | ICD-10-CM

## 2013-05-24 DIAGNOSIS — I10 Essential (primary) hypertension: Secondary | ICD-10-CM

## 2013-05-24 LAB — IBC PANEL
Iron: 71 ug/dL (ref 42–145)
Saturation Ratios: 21.1 % (ref 20.0–50.0)
Transferrin: 240.7 mg/dL (ref 212.0–360.0)

## 2013-05-24 LAB — HEMOGLOBIN AND HEMATOCRIT, BLOOD
HCT: 35.4 % — ABNORMAL LOW (ref 36.0–46.0)
Hemoglobin: 11.8 g/dL — ABNORMAL LOW (ref 12.0–15.0)

## 2013-05-24 NOTE — Assessment & Plan Note (Signed)
No sign of continued bleeding.  PLan F/u H/H and iron panel with recommendations to follow.   Continue PPI therapy.

## 2013-05-24 NOTE — Assessment & Plan Note (Signed)
Asymptomatic and doing well 

## 2013-05-24 NOTE — Progress Notes (Signed)
Pre visit review using our clinic review tool, if applicable. No additional management support is needed unless otherwise documented below in the visit note. 

## 2013-05-24 NOTE — Patient Instructions (Signed)
Good to see you. I am glad that you are getting your energy back.  Please avoid Peptobismal - it will turn the stool black and "muddy" the water in terms of GI bleeding.  For chronic diarrhea it will be helpful to take a bulk laxative which is really a regulator of stool consistancy and bowel habit: in the setting of chronic loose stools it will add bulk and form. There are many good products: generic metamucil, fibercon,  Benefiber, etc.   Lab today: blood count and iron panel. If all is well you will get a letter in about a week. If there are problems we will call.

## 2013-05-24 NOTE — Assessment & Plan Note (Signed)
BP Readings from Last 3 Encounters:  05/24/13 110/72  04/08/13 140/90  04/01/13 123/82

## 2013-05-24 NOTE — Progress Notes (Signed)
  Subjective:    Patient ID: Cynthia Vargas, female    DOB: July 06, 1931, 79 y.o.   MRN: [id number]  HPI Cynthia Vargas presents for follow up: she had a UGI bleed with a duodenal ulcer. She has been taking iron. She has had no belly pain, she has had no signs of bleeding. She does have chronic loose stools for which she will take PeptoBismal  PMH, FamHx and SocHx reviewed for any changes and relevance.  \ Current Outpatient Prescriptions on File Prior to Visit  Medication Sig Dispense Refill  . acetaminophen (TYLENOL) 500 MG tablet Take 1,000 mg by mouth as needed.       [name] alum & mag hydroxide-simeth (MAALOX/MYLANTA) 200-200-20 MG/5ML suspension Take 30 mLs by mouth every 4 (four) hours as needed.  355 mL  0  . dexamethasone (DECADRON) 0.1 % ophthalmic suspension Place 1 drop into both eyes every 6 (six) hours.  15 mL  0  . fexofenadine (ALLEGRA) 60 MG tablet Take 60 mg by mouth daily.        . pantoprazole (PROTONIX) 40 MG tablet Take 1 tablet (40 mg total) by mouth 2 (two) times daily.  60 tablet  3   No current facility-administered medications on file prior to visit.      Review of Systems System review is negative for any constitutional, cardiac, pulmonary, GI or neuro symptoms or complaints other than as described in the HPI.     Objective:   Physical Exam Filed Vitals:   05/24/13 1009  BP: 110/72  Pulse: 78  Temp: 97.9 F (36.6 C)   Gen'l - thin woman in no distress. She does have a facial tick - twitching of the corner of her mouth. Cor- 2+ radial pulse, RRR PUlm - normal respirations Abd - BS+, soft, no guarding or rebound.       Assessment & Plan:

## 2013-05-28 ENCOUNTER — Encounter: Payer: Self-pay | Admitting: Internal Medicine

## 2013-07-25 ENCOUNTER — Other Ambulatory Visit (HOSPITAL_COMMUNITY): Payer: Self-pay | Admitting: Internal Medicine

## 2013-08-18 ENCOUNTER — Encounter: Payer: Self-pay | Admitting: Internal Medicine

## 2013-08-18 ENCOUNTER — Ambulatory Visit (INDEPENDENT_AMBULATORY_CARE_PROVIDER_SITE_OTHER): Payer: Medicare Other | Admitting: Internal Medicine

## 2013-08-18 ENCOUNTER — Other Ambulatory Visit: Payer: Medicare Other

## 2013-08-18 VITALS — BP 150/90 | HR 72 | Temp 97.5°F | Wt 113.0 lb

## 2013-08-18 DIAGNOSIS — K279 Peptic ulcer, site unspecified, unspecified as acute or chronic, without hemorrhage or perforation: Secondary | ICD-10-CM

## 2013-08-18 DIAGNOSIS — R531 Weakness: Secondary | ICD-10-CM

## 2013-08-18 DIAGNOSIS — D649 Anemia, unspecified: Secondary | ICD-10-CM

## 2013-08-18 DIAGNOSIS — H8190 Unspecified disorder of vestibular function, unspecified ear: Secondary | ICD-10-CM

## 2013-08-18 DIAGNOSIS — I10 Essential (primary) hypertension: Secondary | ICD-10-CM

## 2013-08-18 DIAGNOSIS — R5381 Other malaise: Secondary | ICD-10-CM

## 2013-08-18 DIAGNOSIS — R5383 Other fatigue: Secondary | ICD-10-CM | POA: Diagnosis not present

## 2013-08-18 DIAGNOSIS — R42 Dizziness and giddiness: Secondary | ICD-10-CM

## 2013-08-18 DIAGNOSIS — Z0000 Encounter for general adult medical examination without abnormal findings: Secondary | ICD-10-CM | POA: Insufficient documentation

## 2013-08-18 LAB — BASIC METABOLIC PANEL
BUN: 17 mg/dL (ref 6–23)
CO2: 29 mEq/L (ref 19–32)
Calcium: 9.1 mg/dL (ref 8.4–10.5)
Chloride: 99 mEq/L (ref 96–112)
Creatinine, Ser: 0.8 mg/dL (ref 0.4–1.2)
GFR: 72.39 mL/min (ref >60.00)
Glucose, Bld: 82 mg/dL (ref 70–99)
Potassium: 4 mEq/L (ref 3.5–5.1)
Sodium: 135 mEq/L (ref 135–145)

## 2013-08-18 LAB — HEMATOCRIT: HCT: 41.1 % (ref 36.0–46.0)

## 2013-08-18 LAB — TSH: TSH: 4.53 u[IU]/mL (ref 0.35–5.50)

## 2013-08-18 LAB — HEMOGLOBIN: Hemoglobin: 13.2 g/dL (ref 12.0–15.0)

## 2013-08-18 NOTE — Assessment & Plan Note (Signed)
Reports weakness today.   Plan Checked TSH today (has not had TSH checked in four years).

## 2013-08-18 NOTE — Patient Instructions (Addendum)
It has been a pleasure providing medical care for you today.  1) Dizziness / Weakness - Take Bonine (meclizine) 1/2 tablet (12.5mg ) as often as every six hours if needed. Works pretty quickly - it is a sedating antihistamine for "inner ear" disequilibrium - We will check a hemoglobin and hematocrit today - We will check a basic metabolic panel today (including sodium, potassium, and glucose) - We will check thyroid stimulating hormone today  2) Blood Pressure - Check your blood pressure at the drug store once or twice in the next couple weeks - Call the office if blood pressure levels are above 150/90  3) Pantoprazole - We will continue this medication for now - If there is a problem with this medicine being covered in the future, we will change the medicine for you  4) Retirement - I am retiring at the end of March. - We recommend that you transfer your care to Dr. Asa Lente

## 2013-08-18 NOTE — Assessment & Plan Note (Addendum)
BP Readings from Last 3 Encounters:  08/18/13 150/90  05/24/13 110/72  04/08/13 140/90   Blood pressure controlled, takes no medication.  Plan Check blood pressure outside clinic twice in the next week.

## 2013-08-18 NOTE — Assessment & Plan Note (Signed)
Patient reports feeling "off balance" every once in a while. This feeling may last up to half of a day.  Plan:  meclizine 12.5mg  q6h prn for dizziness.

## 2013-08-18 NOTE — Assessment & Plan Note (Addendum)
Patient does not report any acute pain since her last peptic ulcer at the end of September '14.  Plan Continue pantoprazole 40mg  daily

## 2013-08-18 NOTE — Progress Notes (Signed)
Pre visit review using our clinic review tool, if applicable. No additional management support is needed unless otherwise documented below in the visit note. 

## 2013-08-18 NOTE — Progress Notes (Signed)
Subjective:     Patient ID: Cynthia Vargas, female   DOB: 08/22/1931, 80 y.o.   MRN: [id number]  HPI  Blurred vision: Some days is unable to read her magazines. First noticed it ten days ago. Intermittent, not constant or progressive. Patient reports increasing dizziness. She feels like she was lightheaded and can faint. Reports general feelings of weakness and malaise that have been present for the last ten days. No    Leg pain: Shooting pain down L leg once last week. Lasted for one hour. No loss of bowel or bladder, swelling of leg, numbness or tingling.   Past Medical History  Diagnosis Date  . CALCANEAL FRACTURE, RIGHT 11/04/2007  . DILATION AND CURETTAGE, HX OF 11/04/2007  . KNEE PAIN, RIGHT, CHRONIC 03/19/2010  . OSTEOPOROSIS 05/02/2007  . PEPTIC ULCER DISEASE 05/02/2007  . Personal History of Other Diseases of Digestive Disease 11/04/2007  . Unspecified essential hypertension 05/02/2007   Past Surgical History  Procedure Laterality Date  . Dilation and curettage of uterus    . Cholecystectomy    . Tonsillectomy    . Calcaneal fracture, right    . Orif hip fracture    . Esophagogastroduodenoscopy N/A 03/30/2013    Procedure: ESOPHAGOGASTRODUODENOSCOPY (EGD);  Surgeon: Jerene Bears, MD;  Location: [facility];  Service: Endoscopy;  Laterality: N/A;   Family History  Problem Relation Age of Onset  . Cancer Father   . Heart disease Mother   . Diabetes Neg Hx   . Hyperlipidemia Neg Hx   . Hypertension Neg Hx    History   Social History  . Marital Status: Single    Spouse Name: N/A    Number of Children: 1  . Years of Education: 16   Occupational History  . reitred    Social History Main Topics  . Smoking status: Never Smoker   . Smokeless tobacco: Never Used  . Alcohol Use: No  . Drug Use: No  . Sexual Activity: No   Other Topics Concern  . Not on file   Social History Narrative   [employer]'s degrees in Development worker, community. married for 2 years then single . 1 son  - Harvie Junior [place]. 2 daughters-put up for adoption. Lives alone and is independent in ADL's. works at [employer] until injured.     Review of Systems Constitutional:  Negative for fever, chills, activity change and unexpected weight change.  HEENT:  Negative for hearing loss, ear pain, congestion, neck stiffness and postnasal drip. Negative for sore throat or swallowing problems. Negative for dental complaints.   Eyes: Per HPI  Respiratory: Negative for chest tightness and wheezing. Negative for DOE.   Cardiovascular: Negative for chest pain or palpitations.  Gastrointestinal: No change in bowel habit. No bloating or gas. No reflux or indigestion.  Genitourinary: Negative for urgency, frequency, flank pain and difficulty urinating.  Musculoskeletal: Negative for myalgias, back pain, arthralgias and gait problem.  Neurological: Negative for dizziness, tremors, weakness and headaches.  Hematological: Negative for adenopathy.  Psychiatric/Behavioral: Negative for behavioral problems and dysphoric mood.       Objective:   Physical Exam   Filed Vitals:   08/18/13 1434  BP: 150/90  Pulse: 72  Temp: 97.5 F (36.4 C)   Filed Weights   08/18/13 1434  Weight: 113 lb (51.256 kg)     General: Well developed, well nourished, NAD, appears younger than stated age HEENT: NCAT, PERRLA, EOMI, Anicteic Sclera, lenses clear, normal fundoscopic exam w/o papilledema, mucous membranes moist.  Neck: Supple, no JVD, no masses  Cardiovascular: S1 S2 auscultated, no rubs, murmurs or gallops. Regular rate and rhythm.  Respiratory: Clear to auscultation bilaterally with equal chest rise  Abdomen: Soft, nontender, nondistended, + bowel sounds  Extremities: warm, dry without cyanosis, clubbing, or edema  Neuro: Alert and Oriented x3, cranial nerves II-XII grossly intact.  Skin: Without rashes, exudates, or nodules  Psych: Normal mood and affect with intact judgement and insight MSK: 4+/5 strength in upper and  lower extremities. Reflexes 2+ symmetrically at biceps, triceps, patellar, and achilles.   Current Outpatient Prescriptions on File Prior to Visit  Medication Sig Dispense Refill  . acetaminophen (TYLENOL) 500 MG tablet Take 1,000 mg by mouth as needed.       [name] alum & mag hydroxide-simeth (MAALOX/MYLANTA) 200-200-20 MG/5ML suspension Take 30 mLs by mouth every 4 (four) hours as needed.  355 mL  0  . dexamethasone (DECADRON) 0.1 % ophthalmic suspension Place 1 drop into both eyes every 6 (six) hours.  15 mL  0  . fexofenadine (ALLEGRA) 60 MG tablet Take 60 mg by mouth daily.        . pantoprazole (PROTONIX) 40 MG tablet TAKE 1 TABLET BY MOUTH TWICE DAILY  60 tablet  5   No current facility-administered medications on file prior to visit.       Assessment and Plan

## 2013-08-18 NOTE — Assessment & Plan Note (Addendum)
Pt reports intermittent dizziness / weakness, and feelings of lethargy for the last ten days.  Plan Check H/H today to evaluate recurrent anemia  Addendum:  Lab Results  Component Value Date   HGB 13.2 08/18/2013

## 2013-08-22 ENCOUNTER — Encounter: Payer: Self-pay | Admitting: Internal Medicine

## 2013-09-07 ENCOUNTER — Telehealth: Payer: Self-pay | Admitting: *Deleted

## 2013-09-07 NOTE — Telephone Encounter (Signed)
This is a good reading. No changes in medication

## 2013-09-07 NOTE — Telephone Encounter (Signed)
Patient phoned, at PCP request with bp readings (only has part of 1 - 2 total)    140 SBP  135/83  CB# [phone]

## 2013-09-08 NOTE — Telephone Encounter (Signed)
Phoned patient, no answer.

## 2013-09-16 ENCOUNTER — Encounter: Payer: Self-pay | Admitting: Gastroenterology

## 2013-09-16 ENCOUNTER — Telehealth: Payer: Self-pay | Admitting: Internal Medicine

## 2013-09-16 DIAGNOSIS — R0609 Other forms of dyspnea: Secondary | ICD-10-CM

## 2013-09-16 DIAGNOSIS — R194 Change in bowel habit: Secondary | ICD-10-CM

## 2013-09-16 DIAGNOSIS — R6889 Other general symptoms and signs: Secondary | ICD-10-CM

## 2013-09-16 NOTE — Telephone Encounter (Signed)
Referrals placed 

## 2013-09-16 NOTE — Telephone Encounter (Signed)
Pt wants to be referred to cardiology.  She had chest pressure March 10 and 11th.  She gets tired when walking up hills. She is also having problems with diarrhea and wants a referral to GI.

## 2013-09-17 ENCOUNTER — Telehealth: Payer: Self-pay

## 2013-09-17 NOTE — Telephone Encounter (Signed)
Patient states [facility] still has not received the prescription for Pantoprazole. She thinks it needs prior authorization. I will call the pharmacy Monday to be sure of what is needed.

## 2013-09-20 NOTE — Telephone Encounter (Signed)
Received a fax back from [facility] stating patient does not have prescription benefits with [place]

## 2013-09-20 NOTE — Telephone Encounter (Addendum)
I spoke with [facility] and patient's insurance is requiring prior authorization on Pantoprazole. [phone]) PA has been submitted to insurance.

## 2013-09-22 NOTE — Telephone Encounter (Signed)
[facility] faxed again stating PA for Pantoprazole 40 mg. I called the phone number listed [phone] [employer] was advised I need to call [phone] [employer] and the rep stated I have the wrong dept. Was then transferred to [phone] and spoke with Deforest Hoyles who stated I needed to talk to someone for qty limit exception. I was transferred again and left a message.

## 2013-09-24 MED ORDER — PANTOPRAZOLE SODIUM 40 MG PO TBEC: DELAYED_RELEASE_TABLET | ORAL | Status: DC

## 2013-09-24 NOTE — Telephone Encounter (Signed)
Per Dr Linda Hedges patient is to take pantoprazaole once a day. Not twice a day. I notified patient via voicemail. Med list changed.

## 2013-09-29 ENCOUNTER — Telehealth: Payer: Self-pay | Admitting: Internal Medicine

## 2013-09-29 MED ORDER — PANTOPRAZOLE SODIUM 40 MG PO TBEC: DELAYED_RELEASE_TABLET | ORAL | Status: DC

## 2013-09-29 NOTE — Telephone Encounter (Signed)
Pt wants to know if we have heard anything on the pantoprazole.  She says the fax number is [phone] attn: [facility] dept.  She states all you need to do is fax the form.

## 2013-09-29 NOTE — Telephone Encounter (Signed)
Per 09/17/13 phone note Pantoprazole does not need prior authorization since she will be taking it one time a day per Dr Linda Hedges. Thanks

## 2013-09-29 NOTE — Telephone Encounter (Signed)
Sent to pharmacy 

## 2013-09-29 NOTE — Telephone Encounter (Signed)
Has it been sent to the pharmacy.  She is upset that it hasn't been sent yet.

## 2013-10-12 ENCOUNTER — Ambulatory Visit (INDEPENDENT_AMBULATORY_CARE_PROVIDER_SITE_OTHER): Payer: Medicare Other | Admitting: Cardiology

## 2013-10-12 ENCOUNTER — Encounter: Payer: Self-pay | Admitting: Cardiology

## 2013-10-12 VITALS — BP 136/77 | HR 69 | Ht 62.0 in | Wt 110.0 lb

## 2013-10-12 DIAGNOSIS — I10 Essential (primary) hypertension: Secondary | ICD-10-CM

## 2013-10-12 DIAGNOSIS — R079 Chest pain, unspecified: Secondary | ICD-10-CM | POA: Insufficient documentation

## 2013-10-12 NOTE — Patient Instructions (Signed)
Your physician recommends that you continue on your current medications as directed. Please refer to the Current Medication list given to you today.  Your physician has requested that you have an echocardiogram. Echocardiography is a painless test that uses sound waves to create images of your heart. It provides your doctor with information about the size and shape of your heart and how well your heart's chambers and valves are working. This procedure takes approximately one hour. There are no restrictions for this procedure.  Your physician has requested that you have a lexiscan myoview. For further information please visit [email or web address]. Please follow instruction sheet, as given.  Your physician recommends that you schedule a follow-up appointment one week after test are done with Dr Radford Pax

## 2013-10-12 NOTE — Progress Notes (Signed)
[address] [place], [place]  [address] Phone: [phone] Fax:  [phone]  Date:  10/12/2013   ID:  Cynthia Vargas, Cynthia Vargas [birth year], MRN [id number]  PCP:  Gwendolyn Grant, MD  Cardiologist:  Fransico Him, MD     History of Present Illness: Cynthia Vargas is a 80 y.o. female with a history of HTN presents today for evaluation of chest pain.  She describes it as a pulling sensation in her chest that occurs at rest and with exertion.  It can come on in the middle of the night and she has to prop herself up on pillows.  When it occurs at night it can radiate into her teeth but no other radiation.  She denies any SOB, diaphoresis or nausea with it.  She has episodes while walking her dog.   She describes the chest discomfort as a hard pressure on her chest. If she gets it when she is walking her dog she has to sit down.  She also has noticed the chest pressure when she vacums the house and feels very fatigued.  She denies any DOE.  She denies any palpitations or syncope.    Wt Readings from Last 3 Encounters:  10/12/13 110 lb (49.896 kg)  08/18/13 113 lb (51.256 kg)  05/24/13 109 lb (49.442 kg)     Past Medical History  Diagnosis Date  . CALCANEAL FRACTURE, RIGHT 11/04/2007  . DILATION AND CURETTAGE, HX OF 11/04/2007  . KNEE PAIN, RIGHT, CHRONIC 03/19/2010  . OSTEOPOROSIS 05/02/2007  . PEPTIC ULCER DISEASE 05/02/2007  . Personal History of Other Diseases of Digestive Disease 11/04/2007  . Unspecified essential hypertension 05/02/2007    Current Outpatient Prescriptions  Medication Sig Dispense Refill  . acetaminophen (TYLENOL) 500 MG tablet Take 1,000 mg by mouth as needed.       . pantoprazole (PROTONIX) 40 MG tablet TAKE 1 TABLET BY MOUTH ONCE DAILY  30 tablet  5   No current facility-administered medications for this visit.    Allergies:    Allergies  Allergen Reactions  . Peanut-Containing Drug Products Other (See Comments)    angioedema    Social History:   The patient  reports that she has never smoked. She has never used smokeless tobacco. She reports that she does not drink alcohol or use illicit drugs.   Family History:  The patient's family history includes Cancer in her father; Heart disease in her mother. There is no history of Diabetes, Hyperlipidemia, or Hypertension.   ROS:  Please see the history of present illness.      All other systems reviewed and negative.   PHYSICAL EXAM: VS:  BP 136/77  Pulse 69  Ht 5\' 2"  (1.575 m)  Wt 110 lb (49.896 kg)  BMI 20.11 kg/m2 Well nourished, well developed, in no acute distress HEENT: normal Neck: no JVD Cardiac:  normal S1, S2; RRR; no murmur Lungs:  clear to auscultation bilaterally, no wheezing, rhonchi or rales Abd: soft, nontender, no hepatomegaly Ext: no edema Skin: warm and dry Neuro:  CNs 2-12 intact, no focal abnormalities noted  EKG:  NSR with normal intervals and no ST changes     ASSESSMENT AND PLAN:  1. Chest pain with CRF including postmenopausal state and HTN.  EKG is nonischemic.   - check 2D echo to assess LVF - Lexiscan myoview to rule out ischemia - she cannot walk due to chronic leg pain 2. HTN - well controlled  Signed, Fransico Him, MD  10/12/2013 10:46 AM

## 2013-10-20 ENCOUNTER — Encounter: Payer: Self-pay | Admitting: Internal Medicine

## 2013-10-20 ENCOUNTER — Ambulatory Visit (INDEPENDENT_AMBULATORY_CARE_PROVIDER_SITE_OTHER): Payer: Medicare Other | Admitting: Internal Medicine

## 2013-10-20 VITALS — BP 122/78 | HR 78 | Temp 97.4°F | Wt 110.8 lb

## 2013-10-20 DIAGNOSIS — L719 Rosacea, unspecified: Secondary | ICD-10-CM

## 2013-10-20 DIAGNOSIS — J309 Allergic rhinitis, unspecified: Secondary | ICD-10-CM

## 2013-10-20 MED ORDER — METRONIDAZOLE 0.75 % EX GEL: 1.0000 "application " | Freq: Two times a day (BID) | CUTANEOUS | Status: DC

## 2013-10-20 NOTE — Patient Instructions (Signed)

## 2013-10-20 NOTE — Progress Notes (Signed)
Pre visit review using our clinic review tool, if applicable. No additional management support is needed unless otherwise documented below in the visit note. 

## 2013-10-20 NOTE — Progress Notes (Signed)
   Subjective:    Patient ID: Cynthia Vargas, female    DOB: 1932-06-22, 80 y.o.   MRN: [id number]  HPI   Her symptoms began 10/16/13 as rhinitis associated itchy, watery eyes, and sneezing. This was in the context of pollen exposure. She used over-the-counter antihistamines with only partial relief, limited to less than 3 hours in duration.  She has some dermatitis which developed in the maxillary areas 4/19.   Review of Systems  She specifically denies frontal headache, facial sinus pain, dental pain, sore throat, nasal purulence, otic pain, or otic discharge  She has no cough, sputum production, wheezing, or shortness of breath  There is no associated fever, chills, or sweats.       Objective:   Physical Exam General appearance:thin ; no acute distress or increased work of breathing is present.  No  lymphadenopathy about the head, neck, or axilla noted.   Eyes: No conjunctival inflammation or lid edema is present. There is no scleral icterus.EOMI & vision intact  Ears:  External ear exam shows no significant lesions or deformities.  Otoscopic examination reveals clear canals, tympanic membranes are intact bilaterally without bulging, retraction, inflammation or discharge.  Nose:  External nasal examination shows no deformity or inflammation. Nasal mucosa are pink and moist without lesions or exudates. No septal dislocation or deviation.No obstruction to airflow.   Oral exam: Dental hygiene is good; lips and gums are healthy appearing.There is no oropharyngeal erythema or exudate noted.   Neck:  No deformities, masses, or tenderness noted.   Supple with full range of motion without pain.   Heart:  Normal rate and regular rhythm. S1 and S2 normal without gallop, murmur, click, rub or other extra sounds.   Lungs:Chest clear to auscultation; no wheezes, rhonchi,rales ,or rubs present.No increased work of breathing.    Extremities:  No cyanosis, edema, or clubbing  noted     Skin: Warm & dry , erythematous dermatitis over the malar regions and extending in the paranasal areas to the medial eyebrow area bilaterally.         Assessment & Plan:  #1 allergic rhinitis  #2 clinical rosacea  Plan: See orders

## 2013-10-28 ENCOUNTER — Ambulatory Visit (HOSPITAL_BASED_OUTPATIENT_CLINIC_OR_DEPARTMENT_OTHER): Payer: Medicare Other | Admitting: Radiology

## 2013-10-28 ENCOUNTER — Ambulatory Visit (HOSPITAL_COMMUNITY): Payer: Medicare Other | Attending: Cardiology | Admitting: Radiology

## 2013-10-28 VITALS — BP 150/83 | HR 64 | Ht 62.0 in | Wt 104.0 lb

## 2013-10-28 DIAGNOSIS — R079 Chest pain, unspecified: Secondary | ICD-10-CM

## 2013-10-28 DIAGNOSIS — R0989 Other specified symptoms and signs involving the circulatory and respiratory systems: Secondary | ICD-10-CM | POA: Insufficient documentation

## 2013-10-28 DIAGNOSIS — Z8249 Family history of ischemic heart disease and other diseases of the circulatory system: Secondary | ICD-10-CM | POA: Insufficient documentation

## 2013-10-28 DIAGNOSIS — R42 Dizziness and giddiness: Secondary | ICD-10-CM | POA: Insufficient documentation

## 2013-10-28 DIAGNOSIS — R0609 Other forms of dyspnea: Secondary | ICD-10-CM | POA: Insufficient documentation

## 2013-10-28 DIAGNOSIS — R0789 Other chest pain: Secondary | ICD-10-CM | POA: Insufficient documentation

## 2013-10-28 DIAGNOSIS — R0602 Shortness of breath: Secondary | ICD-10-CM

## 2013-10-28 DIAGNOSIS — I10 Essential (primary) hypertension: Secondary | ICD-10-CM | POA: Insufficient documentation

## 2013-10-28 DIAGNOSIS — R072 Precordial pain: Secondary | ICD-10-CM

## 2013-10-28 MED ORDER — TECHNETIUM TC 99M SESTAMIBI GENERIC - CARDIOLITE
11.0000 | Freq: Once | INTRAVENOUS | Status: AC | PRN
  Administered 2013-10-28: 11 via INTRAVENOUS

## 2013-10-28 MED ORDER — REGADENOSON 0.4 MG/5ML IV SOLN
0.4000 mg | Freq: Once | INTRAVENOUS | Status: AC
  Administered 2013-10-28: 0.4 mg via INTRAVENOUS

## 2013-10-28 MED ORDER — TECHNETIUM TC 99M SESTAMIBI GENERIC - CARDIOLITE
33.0000 | Freq: Once | INTRAVENOUS | Status: AC | PRN
  Administered 2013-10-28: 33 via INTRAVENOUS

## 2013-10-28 NOTE — Progress Notes (Signed)
Echocardiogram performed.  

## 2013-10-28 NOTE — Progress Notes (Signed)
[facility] [address] [place], [place] [address] [phone]    Cardiology Nuclear Med Study  Chistina Roston Vargas is a 80 y.o. female     MRN : [id number]     DOB: 1931-07-16  Procedure Date: 10/28/2013  Nuclear Med Background Indication for Stress Test:  Evaluation for Ischemia History:  No prior known history of CAD Cardiac Risk Factors: Family History - CAD and Hypertension  Symptoms:Chest Pain with/without exertion (last occurrence yesterday), Dizziness and DOE   Nuclear Pre-Procedure Caffeine/Decaff Intake:  None NPO After: 9:00pm   Lungs:  clear O2 Sat: 98% on room air. IV 0.9% NS with Angio Cath:  22g  IV Site: R Antecubital  IV Started by:  Crissie Figures, RN  Chest Size (in):  34 Cup Size: B  Height: 5\' 2"  (1.575 m)  Weight:  104 lb (47.174 kg)  BMI:  Body mass index is 19.02 kg/(m^2). Tech Comments:  N/A    Nuclear Med Study 1 or 2 day study: 1 day  Stress Test Type:  Lexiscan  Reading MD: N/A  Order Authorizing Provider:  Fransico Him, MD  Resting Radionuclide: Technetium 66m Sestamibi  Resting Radionuclide Dose: 11.0 mCi   Stress Radionuclide:  Technetium 70m Sestamibi  Stress Radionuclide Dose: 33.0 mCi           Stress Protocol Rest HR: 64 Stress HR: 92  Rest BP: 150/83 Stress BP: 146/79  Exercise Time (min): n/a METS: n/a   Predicted Max HR: 141 bpm % Max HR: 65.25 bpm Rate Pressure Product: 13432   Dose of Adenosine (mg):  n/a Dose of Lexiscan: 0.4 mg  Dose of Atropine (mg): n/a Dose of Dobutamine: n/a mcg/kg/min (at max HR)  Stress Test Technologist: Irven Baltimore, RN  Nuclear Technologist:  Annye Rusk, CNMT     Rest Procedure:  Myocardial perfusion imaging was performed at rest 45 minutes following the intravenous administration of Technetium 45m Sestamibi. Rest ECG: NSR with non-specific ST-T wave changes  Stress Procedure:  The patient received IV Lexiscan 0.4 mg over 15-seconds.  Technetium 75m Sestamibi  injected at 30-seconds. The patient complained of funny feeling in head, and chest tightness with Lexiscan.  Quantitative spect images were obtained after a 45 minute delay. Stress ECG: No significant change from baseline ECG  QPS Raw Data Images:  Normal; no motion artifact; normal heart/lung ratio. Stress Images:  Normal homogeneous uptake in all areas of the myocardium. Rest Images:  Normal homogeneous uptake in all areas of the myocardium. Subtraction (SDS):  No evidence of ischemia. Transient Ischemic Dilatation (Normal <1.22):  1.22 Lung/Heart Ratio (Normal <0.45):  0.29  Quantitative Gated Spect Images QGS EDV:  56 ml QGS ESV:  17 ml  Impression Exercise Capacity:  Lexiscan with no exercise. BP Response:  Normal blood pressure response. Clinical Symptoms:  Mild chest pain/dyspnea. ECG Impression:  No significant ST segment change suggestive of ischemia. Comparison with Prior Nuclear Study: No previous nuclear study performed  Overall Impression:  Normal stress nuclear study.  LV Ejection Fraction: 70%.  LV Wall Motion:  NL LV Function; NL Wall Motion   Darlin Coco MD

## 2013-10-29 ENCOUNTER — Telehealth: Payer: Self-pay | Admitting: Cardiology

## 2013-10-29 NOTE — Telephone Encounter (Signed)
New message ° ° ° ° °Want test results °

## 2013-10-29 NOTE — Telephone Encounter (Signed)
Gave pt ECHO results.

## 2013-11-02 ENCOUNTER — Ambulatory Visit: Payer: Medicare Other | Admitting: Gastroenterology

## 2013-11-02 ENCOUNTER — Other Ambulatory Visit: Payer: Self-pay | Admitting: General Surgery

## 2013-11-02 MED ORDER — PANTOPRAZOLE SODIUM 40 MG PO TBEC: 40.0000 mg | DELAYED_RELEASE_TABLET | Freq: Two times a day (BID) | ORAL | Status: DC

## 2013-11-03 ENCOUNTER — Other Ambulatory Visit: Payer: Self-pay | Admitting: General Surgery

## 2013-11-03 DIAGNOSIS — K219 Gastro-esophageal reflux disease without esophagitis: Secondary | ICD-10-CM

## 2013-11-04 ENCOUNTER — Other Ambulatory Visit: Payer: Self-pay | Admitting: General Surgery

## 2013-11-04 ENCOUNTER — Telehealth: Payer: Self-pay | Admitting: *Deleted

## 2013-11-04 MED ORDER — PANTOPRAZOLE SODIUM 40 MG PO TBEC: 40.0000 mg | DELAYED_RELEASE_TABLET | Freq: Two times a day (BID) | ORAL | Status: DC

## 2013-11-04 NOTE — Telephone Encounter (Signed)
PA to [facility] through [email or web address] for pantoprazole

## 2013-11-08 ENCOUNTER — Ambulatory Visit (INDEPENDENT_AMBULATORY_CARE_PROVIDER_SITE_OTHER): Payer: Medicare Other | Admitting: Cardiology

## 2013-11-08 ENCOUNTER — Encounter: Payer: Self-pay | Admitting: Cardiology

## 2013-11-08 ENCOUNTER — Encounter: Payer: Self-pay | Admitting: Internal Medicine

## 2013-11-08 VITALS — BP 122/70 | HR 78 | Ht 62.0 in | Wt 109.0 lb

## 2013-11-08 DIAGNOSIS — I10 Essential (primary) hypertension: Secondary | ICD-10-CM

## 2013-11-08 DIAGNOSIS — K219 Gastro-esophageal reflux disease without esophagitis: Secondary | ICD-10-CM | POA: Insufficient documentation

## 2013-11-08 DIAGNOSIS — R079 Chest pain, unspecified: Secondary | ICD-10-CM

## 2013-11-08 NOTE — Progress Notes (Signed)
[address], [place], [place]  [address] Phone: [phone] Fax:  [phone]  Date:  11/08/2013   ID:  Keenan, Dimitrov 08-Jun-1932, MRN [id number]  PCP:  Gwendolyn Grant, MD  Cardiologist:  Fransico Him, MD     History of Present Illness: Cynthia Vargas is a 80 y.o. female with a history of HTN presents today for followup for recent evaluation of chest pain. She underwent nuclear stress test which showed no ischemia and normal LVF.  2D echo showed normal LVF with mildly leaky AV and moderately leaky TV.  She presents now for followup.  Her Protonix was increased to 40mg  BID and she now presents for followup.  She thinks that her CP has improved on the higher dose of Protonix.  She says that she had a severe pain in her midsternal region that lasted 20 minutes but that is the first episode she has had since increasing her Protonix.   Most of the episodes she has had have been at night.   Wt Readings from Last 3 Encounters:  11/08/13 109 lb (49.442 kg)  10/28/13 104 lb (47.174 kg)  10/20/13 110 lb 12.8 oz (50.259 kg)     Past Medical History  Diagnosis Date  . CALCANEAL FRACTURE, RIGHT 11/04/2007  . DILATION AND CURETTAGE, HX OF 11/04/2007  . KNEE PAIN, RIGHT, CHRONIC 03/19/2010  . OSTEOPOROSIS 05/02/2007  . PEPTIC ULCER DISEASE 05/02/2007  . Personal History of Other Diseases of Digestive Disease 11/04/2007  . Unspecified essential hypertension 05/02/2007  . GERD (gastroesophageal reflux disease)     Current Outpatient Prescriptions  Medication Sig Dispense Refill  . acetaminophen (TYLENOL) 500 MG tablet Take 1,000 mg by mouth as needed.       . metroNIDAZOLE (METROGEL) 0.75 % gel Apply 1 application topically 2 (two) times daily.  45 g  0  . pantoprazole (PROTONIX) 40 MG tablet Take 1 tablet (40 mg total) by mouth 2 (two) times daily.       No current facility-administered medications for this visit.    Allergies:    Allergies  Allergen Reactions  .  Peanut-Containing Drug Products Other (See Comments)    angioedema    Social History:  The patient  reports that she has never smoked. She has never used smokeless tobacco. She reports that she does not drink alcohol or use illicit drugs.   Family History:  The patient's family history includes Cancer in her father; Heart disease in her mother. There is no history of Diabetes, Hyperlipidemia, or Hypertension.   ROS:  Please see the history of present illness.      All other systems reviewed and negative.   PHYSICAL EXAM: VS:  BP 122/70  Pulse 78  Ht 5\' 2"  (1.575 m)  Wt 109 lb (49.442 kg)  BMI 19.93 kg/m2 Well nourished, well developed, in no acute distress HEENT: normal Neck: no JVD Cardiac:  normal S1, S2; RRR; no murmur Lungs:  clear to auscultation bilaterally, no wheezing, rhonchi or rales Abd: soft, nontender, no hepatomegaly Ext: no edema Skin: warm and dry Neuro:  CNs 2-12 intact, no focal abnormalities noted     ASSESSMENT AND PLAN:  1.  Chest pain with CRF including postmenopausal state and HTN. EKG is nonischemic. 2D echo showed normal LVF and no ischemia by nuclear stress test.  She has only had 1 episode of CP since we went up on her Protonix and most of her episodes are at night.  I  think her CP is due to GERD.  She has a history of PUD in the past.  I am going to set her up for a Gi evaluation. 2.  HTN - well controlled 3.  GERD - Protonix increased - continue Protonix BID  Followup with me in 3 months   Signed, Fransico Him, MD 11/08/2013 3:50 PM

## 2013-11-08 NOTE — Patient Instructions (Addendum)
Your physician recommends that you continue on your current medications as directed. Please refer to the Current Medication list given to you today.  You have been referred to [facility]  [place], [place] [phone]   Your physician recommends that you schedule a follow-up appointment in: 3 months with Dr Radford Pax

## 2013-11-30 ENCOUNTER — Telehealth: Payer: Self-pay | Admitting: *Deleted

## 2013-11-30 NOTE — Telephone Encounter (Signed)
[employer] approved quantity limit exception request for pantoprazole 40 mg bid through 11/11/2014

## 2013-12-29 ENCOUNTER — Encounter: Payer: Self-pay | Admitting: Internal Medicine

## 2014-01-04 ENCOUNTER — Encounter: Payer: Self-pay | Admitting: Internal Medicine

## 2014-01-04 ENCOUNTER — Ambulatory Visit (INDEPENDENT_AMBULATORY_CARE_PROVIDER_SITE_OTHER): Payer: Medicare Other | Admitting: Internal Medicine

## 2014-01-04 VITALS — BP 126/72 | HR 80 | Ht 62.0 in | Wt 110.0 lb

## 2014-01-04 DIAGNOSIS — K219 Gastro-esophageal reflux disease without esophagitis: Secondary | ICD-10-CM

## 2014-01-04 DIAGNOSIS — Z8719 Personal history of other diseases of the digestive system: Secondary | ICD-10-CM

## 2014-01-04 DIAGNOSIS — R0789 Other chest pain: Secondary | ICD-10-CM

## 2014-01-04 NOTE — Progress Notes (Signed)
Patient ID: Cynthia Vargas, female   DOB: Oct 09, 1931, 80 y.o.   MRN: [id number] HPI: Cynthia Vargas is a 80 year old female known to me due to bleeding duodenal ulcer diagnosed in September 2014 by EGD during the time of hospitalization for profound anemia, osteoporosis, hypertension, and GERD who is seen in followup to evaluate atypical chest pain. Over the last 2 months she has developed substernal chest pain and was evaluated by cardiology. She saw Dr. Radford Pax and underwent nuclear stress test which showed no ischemia and a normal LVF. Echocardiogram showed normal left ventricular function with mild leaky aortic and moderate leaky tricuspid valve. Her Protonix was increased from 40 mg once daily to twice daily.  She describes episodes of substernal chest pain, the first started while she was walking her dog. She describes these as "attacks". They last approximately 10 minutes. She estimates approximately 10 of these attacks over the last several months. Since increasing her Protonix to twice daily he has only occurred once at 4 AM. She feels that this has definitely helped her symptoms. The pain when it occurs does not feel to her like true heartburn. She denies dysphagia or odynophagia. She reports a good appetite without nausea or vomiting. Bowel movements have been regular though she does have a history of constipation. She is using Benefiber which helps keep her stools regular. She denies blood in her stool or melena.  EGD performed in September 2014 revealed a 2 cm hiatal hernia, normal stomach which was biopsied for H. pylori and a 10 mm duodenal ulcer. H. pylori biopsies were negative. She was treated with twice-daily PPI and her ulcer was felt secondary to steroids with NSAID use. Her blood counts were followed by primary care and improved to normal   Past Medical History  Diagnosis Date  . CALCANEAL FRACTURE, RIGHT 11/04/2007  . DILATION AND CURETTAGE, HX OF 11/04/2007  . KNEE PAIN, RIGHT, CHRONIC  03/19/2010  . OSTEOPOROSIS 05/02/2007  . PEPTIC ULCER DISEASE 05/02/2007  . Personal History of Other Diseases of Digestive Disease 11/04/2007  . Unspecified essential hypertension 05/02/2007  . GERD (gastroesophageal reflux disease)     Past Surgical History  Procedure Laterality Date  . Dilation and curettage of uterus    . Cholecystectomy    . Tonsillectomy    . Calcaneal fracture, right    . Orif hip fracture    . Esophagogastroduodenoscopy N/A 03/30/2013    Procedure: ESOPHAGOGASTRODUODENOSCOPY (EGD);  Surgeon: Jerene Bears, MD;  Location: [facility];  Service: Endoscopy;  Laterality: N/A;    Outpatient Prescriptions Prior to Visit  Medication Sig Dispense Refill  . acetaminophen (TYLENOL) 500 MG tablet Take 1,000 mg by mouth as needed.       . metroNIDAZOLE (METROGEL) 0.75 % gel Apply 1 application topically 2 (two) times daily.  45 g  0  . pantoprazole (PROTONIX) 40 MG tablet Take 1 tablet (40 mg total) by mouth 2 (two) times daily.       No facility-administered medications prior to visit.    Allergies  Allergen Reactions  . Peanut-Containing Drug Products Other (See Comments)    angioedema    Family History  Problem Relation Age of Onset  . Cancer Father   . Heart disease Mother   . Diabetes Neg Hx   . Hyperlipidemia Neg Hx   . Hypertension Neg Hx     History  Substance Use Topics  . Smoking status: Never Smoker   . Smokeless tobacco: Never Used  . Alcohol  Use: No    ROS: As per history of present illness, otherwise negative  BP 126/72  Pulse 80  Ht 5\' 2"  (1.575 m)  Wt 110 lb (49.896 kg)  BMI 20.11 kg/m2 Constitutional: Well-developed and well-nourished. No distress. HEENT: Normocephalic and atraumatic. Oropharynx is clear and moist. No oropharyngeal exudate. Conjunctivae are normal.  No scleral icterus. Neck: Neck supple. Trachea midline. Cardiovascular: Normal rate, regular rhythm and intact distal pulses. No M/R/G Pulmonary/chest: Effort normal and  breath sounds normal. No wheezing, rales or rhonchi. Abdominal: Soft, nontender, nondistended. Bowel sounds active throughout.  Extremities: no clubbing, cyanosis, mild left lower extremity edema Neurological: Alert and oriented to person place and time. Psychiatric: Normal mood and affect. Behavior is normal.  RELEVANT LABS AND IMAGING: CBC    Component Value Date/Time   WBC 5.9 03/30/2013 0825   RBC 2.60* 03/30/2013 0825   RBC 1.68* 03/29/2013 2150   HGB 13.2 08/18/2013 1559   HCT 41.1 08/18/2013 1559   PLT 170 03/30/2013 0825   MCV 94.2 03/30/2013 0825   MCH 32.3 03/30/2013 0825   MCHC 34.3 03/30/2013 0825   RDW 16.4* 03/30/2013 0825   LYMPHSABS 1.3 03/29/2013 2150   MONOABS 0.7 03/29/2013 2150   EOSABS 0.0 03/29/2013 2150   BASOSABS 0.1 03/29/2013 2150    CMP     Component Value Date/Time   NA 135 08/18/2013 1559   K 4.0 08/18/2013 1559   CL 99 08/18/2013 1559   CO2 29 08/18/2013 1559   GLUCOSE 82 08/18/2013 1559   BUN 17 08/18/2013 1559   CREATININE 0.8 08/18/2013 1559   CALCIUM 9.1 08/18/2013 1559   PROT 5.8* 04/07/2013 1518   ALBUMIN 3.2* 04/07/2013 1518   AST 19 04/07/2013 1518   ALT 16 04/07/2013 1518   ALKPHOS 54 04/07/2013 1518   BILITOT 0.4 04/07/2013 1518   GFRNONAA 80* 03/30/2013 0825   GFRAA >90 03/30/2013 0825    ASSESSMENT/PLAN: 80 year old female known to me due to bleeding duodenal ulcer diagnosed in September 2014 by EGD during the time of hospitalization for profound anemia, osteoporosis, hypertension, and GERD who is seen in followup to evaluate atypical chest pain.  1.  Atypical CP/hx of GERD -- her chest discomfort has improved with twice-daily PPI. It is reassuring that her cardiology evaluation was negative. Another option could be esophageal spasm, but often esophageal spasm is exacerbated by uncontrolled reflux. Given her improvement, she will continue twice-daily PPI for an additional 2 months and then return for followup. I will order an esophagram to evaluate  esophageal motility. The esophagus was endoscopically unremarkable other than a small hiatal hernia at the time of her endoscopy in late September 2014.  After she notify me should her chest pain return or become more frequent prior to followup and she voices understanding.  2. Duodenal ulcer with anemia -- felt secondary to steroids with NSAID use. Biopsies were negative for H. pylori. Blood counts improved to normal indicating healing of duodenal ulcer. No current symptoms to support ongoing gastroduodenitis or ulcer disease.

## 2014-01-04 NOTE — Patient Instructions (Signed)
You have been scheduled for an Esophagram at [facility] on 01/11/2014 @ 10:30am please arrive at radiology at 10:15am  Nothing to eat or drink 4 hours prior to you test. If you need to reschedule this appointment please call [phone]

## 2014-01-06 ENCOUNTER — Ambulatory Visit (INDEPENDENT_AMBULATORY_CARE_PROVIDER_SITE_OTHER): Payer: Medicare Other | Admitting: Internal Medicine

## 2014-01-06 ENCOUNTER — Encounter: Payer: Self-pay | Admitting: Internal Medicine

## 2014-01-06 VITALS — BP 124/82 | HR 80 | Temp 98.3°F | Wt 108.6 lb

## 2014-01-06 DIAGNOSIS — I83893 Varicose veins of bilateral lower extremities with other complications: Secondary | ICD-10-CM

## 2014-01-06 DIAGNOSIS — I8310 Varicose veins of unspecified lower extremity with inflammation: Secondary | ICD-10-CM

## 2014-01-06 DIAGNOSIS — I872 Venous insufficiency (chronic) (peripheral): Secondary | ICD-10-CM

## 2014-01-06 MED ORDER — MOMETASONE FUROATE 0.1 % EX OINT: TOPICAL_OINTMENT | Freq: Every day | CUTANEOUS | Status: DC

## 2014-01-06 NOTE — Progress Notes (Signed)
   Subjective:    Patient ID: Cynthia Vargas, female    DOB: Nov 14, 1931, 80 y.o.   MRN: [id number]  HPI Pt was bitten by a bug approximately two weeks ago on the L calf, superior to her ankle. The pt was working out in the yard and felt something bite her. She believes a portion of the stinger may still be embedded under her skin. She has subsequently developed some swelling in her L ankle and also reports a patchy red rash that is spreading across her LLE. The patient reports that her LLE is sore and itchy. The patient is also have soreness and itching of her RLE as well. She has used cortisone cream to no relief.   Review of Systems  Constitutional: Negative for fever and chills.  HENT: Negative for trouble swallowing.   Respiratory: Negative for shortness of breath.   Allergic/Immunologic: Positive for environmental allergies.       Objective:   Physical Exam        Assessment & Plan:

## 2014-01-06 NOTE — Progress Notes (Signed)
   Subjective:    Patient ID: Cynthia Vargas, female    DOB: May 15, 1932, 80 y.o.   MRN: [id number]  HPI  She was working in her garden approximately 2 weeks ago in the afternoon when she was bitten or stung by an unknown vector. She's developed some swelling in the left ankle and some rash which has been progressive over the left lower extremity. She describes it as a soreness and itching. This did not respond to topical metronidazole or over-the-counter cortisone.    Review of Systems  She denies any chest pain, palpitations, or dyspnea.     Objective:   Physical Exam  She appears healthy and well-nourished in no distress  Chest is clear to auscultation. She has no increased work of breathing S4 with regular rhythm.  Pedal pulses are intact  She has dependent rubor especially of the left lower extremity. The rubor does improve with leg elevation. There are large varicosities of the left lower extremity medially from the knee down. These empty immediately with leg elevation.  There's a small well healed eschar over the right posterior lower extremity.  Homans sign is negative.  There is bland, slightly rough areas of dermatitis mainly on the right lower extremity. This suggests stasis hyperpigmentation.          Assessment & Plan:  #1 status post vector bite; no evidence of cellulitis or deep venous thrombosis clinically  #2 severe varicose veins with venous insufficiency.  #3 stasis hyperpigmentation/dermatitis  Options to treat her varicose veins discussed. Support hose are problematic as over the calf support hose would have a tourniquet effect on the varicose veins at and above the knee.  She will consider possible vascular specialty referral.

## 2014-01-06 NOTE — Progress Notes (Signed)
Pre visit review using our clinic review tool, if applicable. No additional management support is needed unless otherwise documented below in the visit note. 

## 2014-01-06 NOTE — Patient Instructions (Signed)
Please consider Vascular Specialist  evaluation to assess the varicose veins.

## 2014-01-07 ENCOUNTER — Telehealth: Payer: Self-pay | Admitting: Internal Medicine

## 2014-01-07 DIAGNOSIS — L719 Rosacea, unspecified: Secondary | ICD-10-CM

## 2014-01-07 MED ORDER — METRONIDAZOLE 0.75 % EX GEL: 1.0000 "application " | Freq: Two times a day (BID) | CUTANEOUS | Status: DC

## 2014-01-07 NOTE — Telephone Encounter (Signed)
Patient states that she needs her metroNIDAZOLE (METROGEL) 0.75 % gel prescription sent to [facility] on [address]. Patient was in for OV yesterday with Dr. Linna Darner. Please advise.

## 2014-01-11 ENCOUNTER — Ambulatory Visit (HOSPITAL_COMMUNITY)
Admission: RE | Admit: 2014-01-11 | Discharge: 2014-01-11 | Disposition: A | Discharge status: Home / Self Care | Payer: Medicare Other | Source: Ambulatory Visit | Attending: Internal Medicine | Admitting: Internal Medicine

## 2014-01-11 ENCOUNTER — Other Ambulatory Visit: Payer: Self-pay | Admitting: Internal Medicine

## 2014-01-11 DIAGNOSIS — R0789 Other chest pain: Secondary | ICD-10-CM

## 2014-01-11 DIAGNOSIS — R079 Chest pain, unspecified: Secondary | ICD-10-CM | POA: Insufficient documentation

## 2014-01-11 DIAGNOSIS — M47812 Spondylosis without myelopathy or radiculopathy, cervical region: Secondary | ICD-10-CM | POA: Insufficient documentation

## 2014-02-08 ENCOUNTER — Ambulatory Visit (INDEPENDENT_AMBULATORY_CARE_PROVIDER_SITE_OTHER): Payer: Medicare Other | Admitting: Cardiology

## 2014-02-08 ENCOUNTER — Encounter: Payer: Self-pay | Admitting: Cardiology

## 2014-02-08 VITALS — BP 124/80 | HR 72 | Ht 62.0 in | Wt 110.0 lb

## 2014-02-08 DIAGNOSIS — I10 Essential (primary) hypertension: Secondary | ICD-10-CM

## 2014-02-08 DIAGNOSIS — R079 Chest pain, unspecified: Secondary | ICD-10-CM

## 2014-02-08 DIAGNOSIS — K279 Peptic ulcer, site unspecified, unspecified as acute or chronic, without hemorrhage or perforation: Secondary | ICD-10-CM

## 2014-02-08 NOTE — Patient Instructions (Signed)
Your physician recommends that you continue on your current medications as directed. Please refer to the Current Medication list given to you today.  Your physician wants you to follow-up in: 6 months with Dr Turner You will receive a reminder letter in the mail two months in advance. If you don't receive a letter, please call our office to schedule the follow-up appointment.  

## 2014-02-08 NOTE — Progress Notes (Signed)
[address], [place], [place]  [address] Phone: [phone] Fax:  [phone]  Date:  02/08/2014   ID:  Alitza, Cowman 1932/03/25, MRN [id number]  PCP:  Gwendolyn Grant, MD  Cardiologist:  Fransico Him, MD     History of Present Illness: Cynthia Vargas is a 80 y.o. female with a history of HTN presents today for followup.  She saw me in the spring for followup of CP.  She underwent nuclear stress test which showed no ischemia and normal LVF. 2D echo showed normal LVF with mildly leaky AV and moderately leaky TV. She presents now for followup.  Her CP has improved on the higher dose of Protonix. Occasionally in the afternoon around 3pm she will have some chest pressure when she lays down to read.  She has no exertional CP.  She denies any SOB, dizziness, palpitations or syncope. She occasionally has some mild LE edema in the heat.     Wt Readings from Last 3 Encounters:  02/08/14 110 lb (49.896 kg)  01/06/14 108 lb 9.6 oz (49.261 kg)  01/04/14 110 lb (49.896 kg)     Past Medical History  Diagnosis Date  . CALCANEAL FRACTURE, RIGHT 11/04/2007  . DILATION AND CURETTAGE, HX OF 11/04/2007  . KNEE PAIN, RIGHT, CHRONIC 03/19/2010  . OSTEOPOROSIS 05/02/2007  . PEPTIC ULCER DISEASE 05/02/2007  . Personal History of Other Diseases of Digestive Disease 11/04/2007  . Unspecified essential hypertension 05/02/2007  . GERD (gastroesophageal reflux disease)     Current Outpatient Prescriptions  Medication Sig Dispense Refill  . acetaminophen (TYLENOL) 500 MG tablet Take 1,000 mg by mouth as needed.       . metroNIDAZOLE (METROGEL) 0.75 % gel Apply 1 application topically 2 (two) times daily.  45 g  5  . mometasone (ELOCON) 0.1 % ointment Apply topically daily.  45 g  0  . pantoprazole (PROTONIX) 40 MG tablet Take 1 tablet (40 mg total) by mouth 2 (two) times daily.      . Wheat Dextrin (BENEFIBER DRINK MIX) PACK Take by mouth 3 (three) times daily.       No current  facility-administered medications for this visit.    Allergies:    Allergies  Allergen Reactions  . Peanut-Containing Drug Products Other (See Comments)    angioedema    Social History:  The patient  reports that she has never smoked. She has never used smokeless tobacco. She reports that she does not drink alcohol or use illicit drugs.   Family History:  The patient's family history includes Cancer in her father; Heart disease in her mother. There is no history of Diabetes, Hyperlipidemia, or Hypertension.   ROS:  Please see the history of present illness.      All other systems reviewed and negative.   PHYSICAL EXAM: VS:  BP 124/80  Pulse 72  Ht 5\' 2"  (1.575 m)  Wt 110 lb (49.896 kg)  BMI 20.11 kg/m2  SpO2 98% Well nourished, well developed, in no acute distress HEENT: normal Neck: no JVD Cardiac:  normal S1, S2; RRR; no murmur Lungs:  clear to auscultation bilaterally, no wheezing, rhonchi or rales Abd: soft, nontender, no hepatomegaly Ext: no edema Skin: warm and dry Neuro:  CNs 2-12 intact, no focal abnormalities noted  ASSESSMENT AND PLAN:  1. Chest pain with CRF including postmenopausal state and HTN. EKG is nonischemic. 2D echo showed normal LVF and no ischemia by nuclear stress test.  I think her  CP is due to GERD. She has a history of PUD in the past and recently was seen by GI who agree that this is most likely GI.   2. HTN - well controlled  3. GERD - Protonix increased  - continue Protonix BID   Followup with me in 6 months   Signed, Fransico Him, MD 02/08/2014 3:35 PM

## 2014-05-05 ENCOUNTER — Other Ambulatory Visit: Payer: Self-pay | Admitting: Cardiology

## 2014-05-06 ENCOUNTER — Other Ambulatory Visit: Payer: Self-pay

## 2014-07-31 ENCOUNTER — Other Ambulatory Visit: Payer: Self-pay | Admitting: Cardiology

## 2014-08-07 NOTE — Progress Notes (Signed)
Cardiology Office Note   Date:  08/08/2014   ID:  Crystal, Scarberry [birth year], MRN [id number]  PCP:  Gwendolyn Grant, MD  Cardiologist:   Sueanne Margarita, MD   Chief Complaint  Patient presents with  . Hypertension  . Gastrophageal Reflux      History of Present Illness: Cynthia Vargas is a 81 y.o. female with a history of HTN and atypical CP secondary to GERD with normal stress test who presents today for followup.  She has no exertional CP.   She still has her chronic chest pain that only occurs around 4am.  It is sharp in nature and occasionally will get it in her teeth.   She says that increasing the dose of the Protonix has helped with the pain significantly. The pain has not changed any in severity or quality since her stress test last spring.    She denies any SOB, dizziness, LE edema, palpitations or syncope. She says that she has more energy than she has had in a while.       Past Medical History  Diagnosis Date  . CALCANEAL FRACTURE, RIGHT 11/04/2007  . DILATION AND CURETTAGE, HX OF 11/04/2007  . KNEE PAIN, RIGHT, CHRONIC 03/19/2010  . OSTEOPOROSIS 05/02/2007  . PEPTIC ULCER DISEASE 05/02/2007  . Personal History of Other Diseases of Digestive Disease 11/04/2007  . Unspecified essential hypertension 05/02/2007  . GERD (gastroesophageal reflux disease)     Past Surgical History  Procedure Laterality Date  . Dilation and curettage of uterus    . Cholecystectomy    . Tonsillectomy    . Calcaneal fracture, right    . Orif hip fracture    . Esophagogastroduodenoscopy N/A 03/30/2013    Procedure: ESOPHAGOGASTRODUODENOSCOPY (EGD);  Surgeon: Jerene Bears, MD;  Location: [facility];  Service: Endoscopy;  Laterality: N/A;     Current Outpatient Prescriptions  Medication Sig Dispense Refill  . acetaminophen (TYLENOL) 500 MG tablet Take 1,000 mg by mouth as needed.     . pantoprazole (PROTONIX) 40 MG tablet TAKE 1 TABLET BY MOUTH TWICE DAILY 60 tablet 0  . Wheat  Dextrin (BENEFIBER DRINK MIX) PACK Take by mouth 3 (three) times daily.     No current facility-administered medications for this visit.    Allergies:   Peanut-containing drug products    Social History:  The patient  reports that she has never smoked. She has never used smokeless tobacco. She reports that she does not drink alcohol or use illicit drugs.   Family History:  The patient's family history includes Cancer in her father; Heart disease in her mother. There is no history of Diabetes, Hyperlipidemia, or Hypertension.    ROS:  Please see the history of present illness.   Otherwise, review of systems are positive for none.   All other systems are reviewed and negative.    PHYSICAL EXAM: VS:  BP 160/86 mmHg  Pulse 68  Ht 5\' 3"  (1.6 m)  Wt 114 lb (51.71 kg)  BMI 20.20 kg/m2  SpO2 99% , BMI Body mass index is 20.2 kg/(m^2). GEN: Well nourished, well developed, in no acute distress HEENT: normal Neck: no JVD, carotid bruits, or masses Cardiac: RRR; no murmurs, rubs, or gallops,no edema  Respiratory:  clear to auscultation bilaterally, normal work of breathing GI: soft, nontender, nondistended, + BS MS: no deformity or atrophy Skin: warm and dry, no rash Neuro:  Strength and sensation are intact Psych: euthymic mood, full affect   EKG:  EKG is not ordered today.    Recent Labs: 08/18/2013: BUN 17; Creatinine 0.8; Hemoglobin 13.2; Potassium 4.0; Sodium 135; TSH 4.53    Lipid Panel    Component Value Date/Time   CHOL 222* 03/19/2010 1406   TRIG 38.0 03/19/2010 1406   HDL 91.90 03/19/2010 1406   CHOLHDL 2 03/19/2010 1406   VLDL 7.6 03/19/2010 1406   LDLDIRECT 115.1 03/19/2010 1406      Wt Readings from Last 3 Encounters:  08/08/14 114 lb (51.71 kg)  02/08/14 110 lb (49.896 kg)  01/06/14 108 lb 9.6 oz (49.261 kg)      ASSESSMENT AND PLAN:  1. Chest pain secondary to GERD. 2D echo showed normal LVF and no ischemia by nuclear stress test.This is the same CP  she had at time of her stress test and only occurs at night and is sharp and greatly improved with protonix. I do not think this is cardiac related.   2. HTN - elevated on exam.  I will start amlodipine 2.5mg  daily.  She will come in for a nurse BP cehck in 2 weeks.  We discussed avoiding added salt in her diet. 3. GERD  - continue Protonix BID    Current medicines are reviewed at length with the patient today.  The patient does not have concerns regarding medicines.  The following changes have been made:  Amlodipine 2.5mg  daily  Labs/ tests ordered today include: None     Disposition:   FU with me in 6 months.  Will get a nurse BP check in 2 weeks   Signed, Sueanne Margarita, MD  08/08/2014 3:17 PM    [facility] [address], [place], [place]  [address] Phone: [phone]; Fax: [phone]

## 2014-08-08 ENCOUNTER — Ambulatory Visit (INDEPENDENT_AMBULATORY_CARE_PROVIDER_SITE_OTHER): Payer: Medicare Other | Admitting: Cardiology

## 2014-08-08 ENCOUNTER — Encounter: Payer: Self-pay | Admitting: Cardiology

## 2014-08-08 VITALS — BP 160/86 | HR 68 | Ht 63.0 in | Wt 114.0 lb

## 2014-08-08 DIAGNOSIS — I10 Essential (primary) hypertension: Secondary | ICD-10-CM

## 2014-08-08 DIAGNOSIS — K219 Gastro-esophageal reflux disease without esophagitis: Secondary | ICD-10-CM

## 2014-08-08 DIAGNOSIS — R079 Chest pain, unspecified: Secondary | ICD-10-CM

## 2014-08-08 MED ORDER — AMLODIPINE BESYLATE 2.5 MG PO TABS
2.5000 mg | ORAL_TABLET | Freq: Every day | ORAL | Status: DC
Start: 2014-08-08 — End: 2015-02-28

## 2014-08-08 NOTE — Patient Instructions (Signed)
Your physician has recommended you make the following change in your medication:  1) START Amlodipine 2.5mg , take daily  Your physician recommends that you schedule a follow-up appointment in: 2 weeks for BP Check Nurse Visit  Your physician wants you to follow-up in: 6 months with Dr. Radford Pax. You will receive a reminder letter in the mail two months in advance. If you don't receive a letter, please call our office to schedule the follow-up appointment.

## 2014-08-22 ENCOUNTER — Encounter: Payer: Self-pay | Admitting: *Deleted

## 2014-08-22 ENCOUNTER — Ambulatory Visit (INDEPENDENT_AMBULATORY_CARE_PROVIDER_SITE_OTHER): Payer: Medicare Other | Admitting: *Deleted

## 2014-08-22 VITALS — BP 142/72 | HR 60 | Wt 112.8 lb

## 2014-08-22 DIAGNOSIS — I10 Essential (primary) hypertension: Secondary | ICD-10-CM

## 2014-08-22 NOTE — Patient Instructions (Signed)
Continue same medication  Try to get BP check at least weekly either at drugstore or fire department.

## 2014-08-22 NOTE — Progress Notes (Signed)
1.) Reason for visit: BP check since starting Amlodipine 2.5 mg daily  2.) Name of MD requesting visit: Dr. Fransico Him  3.) H&P: Hypertension  4.) ROS related to problem:  No c/o today.  States she doesn't feel any different since starting Amlodipine. States she doesn't take her BP at home or checks it at a drug store. BP today much better  at 142/72.    Advised to check BP at least weekly either at drugstore or fire department.                Will send to Dr. Radford Pax for review and her recommendations

## 2014-08-29 ENCOUNTER — Encounter: Payer: Self-pay | Admitting: Internal Medicine

## 2014-08-29 ENCOUNTER — Other Ambulatory Visit (INDEPENDENT_AMBULATORY_CARE_PROVIDER_SITE_OTHER): Payer: Medicare Other

## 2014-08-29 ENCOUNTER — Ambulatory Visit (INDEPENDENT_AMBULATORY_CARE_PROVIDER_SITE_OTHER): Payer: Medicare Other | Admitting: Internal Medicine

## 2014-08-29 VITALS — BP 134/76 | HR 71 | Temp 97.9°F | Resp 16 | Ht 62.0 in | Wt 110.0 lb

## 2014-08-29 DIAGNOSIS — M810 Age-related osteoporosis without current pathological fracture: Secondary | ICD-10-CM

## 2014-08-29 DIAGNOSIS — Z0000 Encounter for general adult medical examination without abnormal findings: Secondary | ICD-10-CM

## 2014-08-29 DIAGNOSIS — I10 Essential (primary) hypertension: Secondary | ICD-10-CM

## 2014-08-29 DIAGNOSIS — M25561 Pain in right knee: Secondary | ICD-10-CM

## 2014-08-29 LAB — BASIC METABOLIC PANEL
BUN: 19 mg/dL (ref 6–23)
CO2: 31 mEq/L (ref 19–32)
Calcium: 9 mg/dL (ref 8.4–10.5)
Chloride: 100 mEq/L (ref 96–112)
Creatinine, Ser: 0.85 mg/dL (ref 0.40–1.20)
GFR: 68.29 mL/min (ref >60.00)
Glucose, Bld: 96 mg/dL (ref 70–99)
Potassium: 4.4 mEq/L (ref 3.5–5.1)
Sodium: 133 mEq/L — ABNORMAL LOW (ref 135–145)

## 2014-08-29 LAB — LIPID PANEL
Cholesterol: 193 mg/dL (ref 0–200)
HDL: 72.5 mg/dL (ref >39.00)
LDL Cholesterol: 113 mg/dL — ABNORMAL HIGH (ref 0–99)
NonHDL: 120.5
Total CHOL/HDL Ratio: 3
Triglycerides: 38 mg/dL (ref 0.0–149.0)
VLDL: 7.6 mg/dL (ref 0.0–40.0)

## 2014-08-29 MED ORDER — TRIAMCINOLONE ACETONIDE 0.1 % EX CREA: 1.0000 "application " | TOPICAL_CREAM | Freq: Two times a day (BID) | CUTANEOUS | Status: DC

## 2014-08-29 NOTE — Assessment & Plan Note (Signed)
Still able to be mobile despite the pain and does not take much medication for it. She has been advised to have surgery on it in the past but does not want to.

## 2014-08-29 NOTE — Patient Instructions (Signed)
We will check your labs today and call you back about the results.   We have sent in a strong cream that you can use where you itch. It should be cleared up in about 1 week.   Come back in about 6 months to follow up. If you have any problems or questions before then please feel free to call the office.   Fall Prevention and Home Safety Falls cause injuries and can affect all age groups. It is possible to use preventive measures to significantly decrease the likelihood of falls. There are many simple measures which can make your home safer and prevent falls. OUTDOORS  Repair cracks and edges of walkways and driveways.  Remove high doorway thresholds.  Trim shrubbery on the main path into your home.  Have good outside lighting.  Clear walkways of tools, rocks, debris, and clutter.  Check that handrails are not broken and are securely fastened. Both sides of steps should have handrails.  Have leaves, snow, and ice cleared regularly.  Use sand or salt on walkways during winter months.  In the garage, clean up grease or oil spills. BATHROOM  Install night lights.  Install grab bars by the toilet and in the tub and shower.  Use non-skid mats or decals in the tub or shower.  Place a plastic non-slip stool in the shower to sit on, if needed.  Keep floors dry and clean up all water on the floor immediately.  Remove soap buildup in the tub or shower on a regular basis.  Secure bath mats with non-slip, double-sided rug tape.  Remove throw rugs and tripping hazards from the floors. BEDROOMS  Install night lights.  Make sure a bedside light is easy to reach.  Do not use oversized bedding.  Keep a telephone by your bedside.  Have a firm chair with side arms to use for getting dressed.  Remove throw rugs and tripping hazards from the floor. KITCHEN  Keep handles on pots and pans turned toward the center of the stove. Use back burners when possible.  Clean up spills  quickly and allow time for drying.  Avoid walking on wet floors.  Avoid hot utensils and knives.  Position shelves so they are not too high or low.  Place commonly used objects within easy reach.  If necessary, use a sturdy step stool with a grab bar when reaching.  Keep electrical cables out of the way.  Do not use floor polish or wax that makes floors slippery. If you must use wax, use non-skid floor wax.  Remove throw rugs and tripping hazards from the floor. STAIRWAYS  Never leave objects on stairs.  Place handrails on both sides of stairways and use them. Fix any loose handrails. Make sure handrails on both sides of the stairways are as long as the stairs.  Check carpeting to make sure it is firmly attached along stairs. Make repairs to worn or loose carpet promptly.  Avoid placing throw rugs at the top or bottom of stairways, or properly secure the rug with carpet tape to prevent slippage. Get rid of throw rugs, if possible.  Have an electrician put in a light switch at the top and bottom of the stairs. OTHER FALL PREVENTION TIPS  Wear low-heel or rubber-soled shoes that are supportive and fit well. Wear closed toe shoes.  When using a stepladder, make sure it is fully opened and both spreaders are firmly locked. Do not climb a closed stepladder.  Add color or contrast  paint or tape to grab bars and handrails in your home. Place contrasting color strips on first and last steps.  Learn and use mobility aids as needed. Install an electrical emergency response system.  Turn on lights to avoid dark areas. Replace light bulbs that burn out immediately. Get light switches that glow.  Arrange furniture to create clear pathways. Keep furniture in the same place.  Firmly attach carpet with non-skid or double-sided tape.  Eliminate uneven floor surfaces.  Select a carpet pattern that does not visually hide the edge of steps.  Be aware of all pets. OTHER HOME SAFETY  TIPS  Set the water temperature for 120 F (48.8 C).  Keep emergency numbers on or near the telephone.  Keep smoke detectors on every level of the home and near sleeping areas. Document Released: 06/07/2002 Document Revised: 12/17/2011 Document Reviewed: 09/06/2011 [facility] Patient Information 2015 [place], [place]. This information is not intended to replace advice given to you by your health care provider. Make sure you discuss any questions you have with your health care provider.

## 2014-08-29 NOTE — Progress Notes (Signed)
Pre visit review using our clinic review tool, if applicable. No additional management support is needed unless otherwise documented below in the visit note. 

## 2014-08-29 NOTE — Assessment & Plan Note (Signed)
Talked with her about home safety. She is up to date on the flu shot, tetanus shot, pneumonia. Needs prevnar but declines today. Has not had shingles shot. Past age for colon cancer screening. Last 2004 without problems. Unclear if she has ever had bone density scan in the past and she cannot recall. Given cream for her rash to use. If no improvement she will call us back.

## 2014-08-29 NOTE — Assessment & Plan Note (Signed)
Continue amlodipine 2.5 mg daily. If no improvement in rash could consider cessation as this is the only new agent since she has had rash. BP good today. Check BMP.

## 2014-08-29 NOTE — Assessment & Plan Note (Signed)
Will need to look back through records to see where this diagnosis came from as well as if she has ever had treatment.

## 2014-08-29 NOTE — Progress Notes (Signed)
   Subjective:    Patient ID: Cynthia Vargas, female    DOB: March 12, 1932, 81 y.o.   MRN: [id number]  HPI Here for medicare wellness, having some itching on her lower leg which she has been scratching. She has tried OTC creams but still having the problem. No known exposure to allergen. Denies changing detergents or new clothes or soaps.   Diet: heart healthy Physical activity: sedentary Depression/mood screen: negative Hearing: hearing decreased right ear Visual acuity: grossly normal, performs annual eye exam  ADLs: capable Fall risk: low (uses cane for ambulation) Home safety: good Cognitive evaluation: intact to orientation, naming, recall and repetition EOL planning: adv directives  I have personally reviewed and have noted 1. The patient's medical and social history 2. Their use of alcohol, tobacco or illicit drugs 3. Their current medications and supplements 4. The patient's functional ability including ADL's, fall risks, home safety risks and hearing or visual impairment. 5. Diet and physical activities 6. Evidence for depression or mood disorders 7. Care team reviewed and updated  Review of Systems  Constitutional: Negative for fever, activity change, appetite change, fatigue and unexpected weight change.  HENT: Negative.   Eyes: Negative.   Respiratory: Negative for cough, chest tightness, shortness of breath and wheezing.   Cardiovascular: Negative for chest pain, palpitations and leg swelling.  Gastrointestinal: Negative for nausea, abdominal pain, diarrhea, constipation and abdominal distention.  Genitourinary: Positive for enuresis.  Musculoskeletal: Positive for arthralgias and gait problem. Negative for myalgias and back pain.  Skin: Positive for color change and rash. Negative for pallor and wound.  Neurological: Negative.   Psychiatric/Behavioral: Negative.       Objective:   Physical Exam  Constitutional: She is oriented to person, place, and time. She  appears well-developed and well-nourished.  HENT:  Head: Normocephalic and atraumatic.  Hard of hearing.  Eyes: EOM are normal.  Neck: Normal range of motion.  Cardiovascular: Normal rate and regular rhythm.   Pulmonary/Chest: Effort normal and breath sounds normal. No respiratory distress. She has no wheezes. She has no rales.  Abdominal: Soft. Bowel sounds are normal. She exhibits no distension. There is no tenderness. There is no rebound.  Musculoskeletal: She exhibits no edema.  Neurological: She is alert and oriented to person, place, and time.  Uses cane for ambulation  Skin: Skin is warm and dry. Rash noted.  Rash with stigmata of scratching on the lower legs, no skin breakdown or macular lesions.    Filed Vitals:   08/29/14 1425  BP: 134/76  Pulse: 71  Temp: 97.9 F (36.6 C)  TempSrc: Oral  Resp: 16  Height: 5\' 2"  (1.575 m)  Weight: 110 lb (49.896 kg)  SpO2: 98%      Assessment & Plan:

## 2014-09-02 ENCOUNTER — Other Ambulatory Visit: Payer: Self-pay | Admitting: Internal Medicine

## 2014-09-04 ENCOUNTER — Other Ambulatory Visit: Payer: Self-pay | Admitting: Cardiology

## 2014-11-18 ENCOUNTER — Encounter: Payer: Self-pay | Admitting: Cardiology

## 2014-12-02 ENCOUNTER — Telehealth: Payer: Self-pay

## 2014-12-02 NOTE — Telephone Encounter (Signed)
Prior auth request for Pantoprazole '40mg'$  bid from [place] denied. Will need to do an appeal. Patient aware. She may need to try something else.

## 2014-12-05 ENCOUNTER — Telehealth: Payer: Self-pay | Admitting: *Deleted

## 2014-12-05 NOTE — Telephone Encounter (Signed)
Patient would like to know if the protonix can be written for qd since her insurance is not wanting to pay for the bid dose. Please advise. Thanks, MI

## 2014-12-06 NOTE — Telephone Encounter (Signed)
I would like her to followup with her PCP regarding her GERD and ongoing treatment with PPI

## 2014-12-07 NOTE — Telephone Encounter (Signed)
Instructed patient to follow-up with her PCP regarding her GERD and ongoing treatment with PPI. Patient agrees with treatment plan.

## 2014-12-20 ENCOUNTER — Encounter: Payer: Self-pay | Admitting: Internal Medicine

## 2014-12-20 ENCOUNTER — Ambulatory Visit (INDEPENDENT_AMBULATORY_CARE_PROVIDER_SITE_OTHER): Payer: Medicare Other | Admitting: Internal Medicine

## 2014-12-20 VITALS — BP 120/78 | HR 74 | Temp 98.2°F | Resp 12 | Wt 111.1 lb

## 2014-12-20 DIAGNOSIS — K219 Gastro-esophageal reflux disease without esophagitis: Secondary | ICD-10-CM

## 2014-12-20 MED ORDER — TRIAMCINOLONE ACETONIDE 0.1 % EX CREA: TOPICAL_CREAM | Freq: Two times a day (BID) | CUTANEOUS | Status: DC

## 2014-12-20 NOTE — Progress Notes (Signed)
Pre visit review using our clinic review tool, if applicable. No additional management support is needed unless otherwise documented below in the visit note. 

## 2014-12-20 NOTE — Patient Instructions (Addendum)
We will have you continue on only once a day of the protonix for the stomach.   Keep using the stool softener as this is fine.   I have sent in the cream for your rash which you should use twice a day.   Food Choices for Gastroesophageal Reflux Disease When you have gastroesophageal reflux disease (GERD), the foods you eat and your eating habits are very important. Choosing the right foods can help ease the discomfort of GERD. WHAT GENERAL GUIDELINES DO I NEED TO FOLLOW?  Choose fruits, vegetables, whole grains, low-fat dairy products, and low-fat meat, fish, and poultry.  Limit fats such as oils, salad dressings, butter, nuts, and avocado.  Keep a food diary to identify foods that cause symptoms.  Avoid foods that cause reflux. These may be different for different people.  Eat frequent small meals instead of three large meals each day.  Eat your meals slowly, in a relaxed setting.  Limit fried foods.  Cook foods using methods other than frying.  Avoid drinking alcohol.  Avoid drinking large amounts of liquids with your meals.  Avoid bending over or lying down until 2-3 hours after eating. WHAT FOODS ARE NOT RECOMMENDED? The following are some foods and drinks that may worsen your symptoms: Vegetables Tomatoes. Tomato juice. Tomato and spaghetti sauce. Chili peppers. Onion and garlic. Horseradish. Fruits Oranges, grapefruit, and lemon (fruit and juice). Meats High-fat meats, fish, and poultry. This includes hot dogs, ribs, ham, sausage, salami, and bacon. Dairy Whole milk and chocolate milk. Sour cream. Cream. Butter. Ice cream. Cream cheese.  Beverages Coffee and tea, with or without caffeine. Carbonated beverages or energy drinks. Condiments Hot sauce. Barbecue sauce.  Sweets/Desserts Chocolate and cocoa. Donuts. Peppermint and spearmint. Fats and Oils High-fat foods, including [place] fries and potato chips. Other Vinegar. Strong spices, such as black pepper, white  pepper, red pepper, cayenne, curry powder, cloves, ginger, and chili powder. The items listed above may not be a complete list of foods and beverages to avoid. Contact your dietitian for more information. Document Released: 06/17/2005 Document Revised: 06/22/2013 Document Reviewed: 04/21/2013 [facility] Patient Information 2015 [place], [place]. This information is not intended to replace advice given to you by your health care provider. Make sure you discuss any questions you have with your health care provider.

## 2014-12-20 NOTE — Assessment & Plan Note (Signed)
Doing well on protonix daily and advised her to continue. If doing well she can even reduce to every other day but she does not wish to at this time. Given dietary instructions for ways to reduce her GERD naturally.

## 2014-12-20 NOTE — Progress Notes (Signed)
   Subjective:    Patient ID: Cynthia Vargas, female    DOB: 24-Sep-1931, 81 y.o.   MRN: [id number]  HPI The patient is an 81 YO female coming in for acid reflux. She was told to start taking her PPI once a day which she has done. She has not had any problems doing so. She did have some symptoms when she ate late at night time. Some mild constipation recently resolved with stool softener. No new complaints.   Review of Systems  Constitutional: Negative for fever, activity change, appetite change, fatigue and unexpected weight change.  Respiratory: Negative for cough, chest tightness, shortness of breath and wheezing.   Cardiovascular: Negative for chest pain, palpitations and leg swelling.  Gastrointestinal: Negative for nausea, abdominal pain, diarrhea, constipation and abdominal distention.  Genitourinary: Positive for enuresis.  Musculoskeletal: Negative for myalgias and back pain.  Skin: Positive for rash. Negative for pallor and wound.      Objective:   Physical Exam  Constitutional: She is oriented to person, place, and time. She appears well-developed and well-nourished.  HENT:  Head: Normocephalic and atraumatic.  Hard of hearing.  Eyes: EOM are normal.  Neck: Normal range of motion.  Cardiovascular: Normal rate and regular rhythm.   Pulmonary/Chest: Effort normal and breath sounds normal. No respiratory distress. She has no wheezes. She has no rales.  Abdominal: Soft. Bowel sounds are normal. She exhibits no distension. There is no tenderness. There is no rebound.  Musculoskeletal: She exhibits no edema.  Neurological: She is alert and oriented to person, place, and time.  Uses cane for ambulation  Skin: Skin is warm and dry. Rash noted.  Rash on left forearm without stigma of scratching.    Filed Vitals:   12/20/14 1513  BP: 120/78  Pulse: 74  Temp: 98.2 F (36.8 C)  TempSrc: Oral  Resp: 12  Weight: 111 lb 1.9 oz (50.404 kg)  SpO2: 97%      Assessment & Plan:

## 2015-01-03 ENCOUNTER — Telehealth: Payer: Self-pay | Admitting: Internal Medicine

## 2015-01-03 ENCOUNTER — Other Ambulatory Visit: Payer: Self-pay | Admitting: Geriatric Medicine

## 2015-01-03 MED ORDER — PANTOPRAZOLE SODIUM 40 MG PO TBEC: 40.0000 mg | DELAYED_RELEASE_TABLET | Freq: Two times a day (BID) | ORAL | Status: DC

## 2015-01-03 NOTE — Telephone Encounter (Signed)
Patient is requesting a prescription for pantoprazole (PROTONIX) 40 MG tablet [[id number]. She no longer sees the doctor that prescribed her last one.  Pharmacy is [facility] on [address]

## 2015-01-03 NOTE — Telephone Encounter (Signed)
Sent to pharmacy 

## 2015-01-06 ENCOUNTER — Other Ambulatory Visit: Payer: Self-pay

## 2015-01-06 ENCOUNTER — Telehealth: Payer: Self-pay | Admitting: Internal Medicine

## 2015-01-06 MED ORDER — PANTOPRAZOLE SODIUM 40 MG PO TBEC: 40.0000 mg | DELAYED_RELEASE_TABLET | Freq: Two times a day (BID) | ORAL | Status: DC

## 2015-01-06 NOTE — Telephone Encounter (Signed)
Patient returned your call.  Please call back

## 2015-01-06 NOTE — Telephone Encounter (Signed)
LVM for pt to call back as soon as possible.   Re: question about med.

## 2015-01-06 NOTE — Telephone Encounter (Signed)
Pt was very short and upset with me. She stated that she only uses 1 per day. I changed the rx to reflect qty needed.

## 2015-01-06 NOTE — Telephone Encounter (Signed)
Pt called in and requested a call back about one of her meds.  She wouldn't give me any other info

## 2015-02-21 ENCOUNTER — Ambulatory Visit (INDEPENDENT_AMBULATORY_CARE_PROVIDER_SITE_OTHER): Payer: Medicare Other | Admitting: Cardiology

## 2015-02-21 ENCOUNTER — Encounter: Payer: Self-pay | Admitting: Cardiology

## 2015-02-21 VITALS — BP 120/70 | HR 63 | Ht 62.0 in | Wt 107.8 lb

## 2015-02-21 DIAGNOSIS — K219 Gastro-esophageal reflux disease without esophagitis: Secondary | ICD-10-CM

## 2015-02-21 DIAGNOSIS — R079 Chest pain, unspecified: Secondary | ICD-10-CM

## 2015-02-21 DIAGNOSIS — I10 Essential (primary) hypertension: Secondary | ICD-10-CM | POA: Diagnosis not present

## 2015-02-21 DIAGNOSIS — R1900 Intra-abdominal and pelvic swelling, mass and lump, unspecified site: Secondary | ICD-10-CM | POA: Diagnosis not present

## 2015-02-21 NOTE — Progress Notes (Signed)
Cardiology Office Note   Date:  02/21/2015   ID:  Cynthia Vargas, Cynthia Vargas [birth year], MRN [id number]  PCP:  Olga Millers, MD    Chief Complaint  Patient presents with  . Gastroesopheal reflux disease      History of Present Illness: Cynthia Vargas is a 81 y.o. female with a history of HTN and atypical CP secondary to GERD with normal stress test who presents today for followup. She has no exertional CP. She has not had any further episodes of CP that she was getting at night prior to going on PPI. She denies any anginal CP, DOE, dizziness, LE edema, palpitations or syncope. She says that she gets tired easily if she overworks herself.  She does sleep on 2 pillow because she says that sometimes it makes her breath better.    Past Medical History  Diagnosis Date  . CALCANEAL FRACTURE, RIGHT 11/04/2007  . DILATION AND CURETTAGE, HX OF 11/04/2007  . KNEE PAIN, RIGHT, CHRONIC 03/19/2010  . OSTEOPOROSIS 05/02/2007  . PEPTIC ULCER DISEASE 05/02/2007  . Personal History of Other Diseases of Digestive Disease 11/04/2007  . Unspecified essential hypertension 05/02/2007  . GERD (gastroesophageal reflux disease)     Past Surgical History  Procedure Laterality Date  . Dilation and curettage of uterus    . Cholecystectomy    . Tonsillectomy    . Calcaneal fracture, right    . Orif hip fracture    . Esophagogastroduodenoscopy N/A 03/30/2013    Procedure: ESOPHAGOGASTRODUODENOSCOPY (EGD);  Surgeon: Jerene Bears, MD;  Location: [facility];  Service: Endoscopy;  Laterality: N/A;     Current Outpatient Prescriptions  Medication Sig Dispense Refill  . acetaminophen (TYLENOL) 500 MG tablet Take 1,000 mg by mouth as needed.     [name] amLODipine (NORVASC) 2.5 MG tablet Take 1 tablet (2.5 mg total) by mouth daily. 30 tablet 6  . pantoprazole (PROTONIX) 40 MG tablet Take 1 tablet (40 mg total) by mouth 2 (two) times daily. 90 tablet 3  . triamcinolone cream (KENALOG)  0.1 % Apply topically 2 (two) times daily. 80 g 0  . Wheat Dextrin (BENEFIBER DRINK MIX) PACK Take by mouth 3 (three) times daily.     No current facility-administered medications for this visit.    Allergies:   Peanut-containing drug products    Social History:  The patient  reports that she has never smoked. She has never used smokeless tobacco. She reports that she does not drink alcohol or use illicit drugs.   Family History:  The patient's family history includes Cancer in her father; Heart disease in her mother. There is no history of Diabetes, Hyperlipidemia, or Hypertension.    ROS:  Please see the history of present illness.   Otherwise, review of systems are positive for none.   All other systems are reviewed and negative.    PHYSICAL EXAM: VS:  BP 120/70 mmHg  Pulse 63  Ht '5\' 2"'$  (1.575 m)  Wt 107 lb 12.8 oz (48.898 kg)  BMI 19.71 kg/m2 , BMI Body mass index is 19.71 kg/(m^2). GEN: Well nourished, well developed, in no acute distress HEENT: normal Neck: no JVD, carotid bruits, or masses Cardiac: RRR; no murmurs, rubs, or gallops,no edema  Respiratory:  clear to auscultation bilaterally, normal work of breathing GI: soft, nontender, nondistended, + BS.  Palpable mass to the left of the umbilicus nonpulsatile MS: no  deformity or atrophy Skin: warm and dry, no rash Neuro:  Strength and sensation are intact Psych: euthymic mood, full affect   EKG:  EKG is ordered today. The ekg ordered today demonstrates NSR at 63bpm with no ST changes   Recent Labs: 08/29/2014: BUN 19; Creatinine, Ser 0.85; Potassium 4.4; Sodium 133*    Lipid Panel    Component Value Date/Time   CHOL 193 08/29/2014 1458   TRIG 38.0 08/29/2014 1458   HDL 72.50 08/29/2014 1458   CHOLHDL 3 08/29/2014 1458   VLDL 7.6 08/29/2014 1458   LDLCALC 113* 08/29/2014 1458   LDLDIRECT 115.1 03/19/2010 1406      Wt Readings from Last 3 Encounters:  02/21/15 107 lb 12.8 oz (48.898 kg)  12/20/14 111 lb  1.9 oz (50.404 kg)  08/29/14 110 lb (49.896 kg)    ASSESSMENT AND PLAN:  1. Chest pain secondary to GERD. 2D echo showed normal LVF and no ischemia by nuclear stress test.Her CP has resolved on PPI. 2. HTN - controlled on amlodipine. 3. GERD - continue Protonix BID  4. Abdominal mass ? Etiology to the left of the umbilicus - I will get a CT of the abdomen to evaluate. It is not pulsatile.  Current medicines are reviewed at length with the patient today.  The patient does not have concerns regarding medicines.  The following changes have been made:  no change  Labs/ tests ordered today: See above Assessment and Plan No orders of the defined types were placed in this encounter.     Disposition:   FU with me in 1 year  Signed, Sueanne Margarita, MD  02/21/2015 3:59 PM    [facility] [place], [place], [place]  [address] Phone: [phone]; Fax: [phone]

## 2015-02-21 NOTE — Patient Instructions (Signed)
Medication Instructions:  Your physician recommends that you continue on your current medications as directed. Please refer to the Current Medication list given to you today.   Labwork: TODAY: BMET  Testing/Procedures: Dr. Radford Pax recommends you have an ABDOMINAL CT.  Follow-Up: Your physician wants you to follow-up in: 1 year with Dr. Radford Pax. You will receive a reminder letter in the mail two months in advance. If you don't receive a letter, please call our office to schedule the follow-up appointment.   Any Other Special Instructions Will Be Listed Below (If Applicable).

## 2015-02-22 LAB — BASIC METABOLIC PANEL
BUN: 19 mg/dL (ref 6–23)
CO2: 30 mEq/L (ref 19–32)
Calcium: 8.9 mg/dL (ref 8.4–10.5)
Chloride: 99 mEq/L (ref 96–112)
Creatinine, Ser: 0.88 mg/dL (ref 0.40–1.20)
GFR: 65.53 mL/min (ref >60.00)
Glucose, Bld: 86 mg/dL (ref 70–99)
Potassium: 4.1 mEq/L (ref 3.5–5.1)
Sodium: 135 mEq/L (ref 135–145)

## 2015-02-23 ENCOUNTER — Ambulatory Visit (INDEPENDENT_AMBULATORY_CARE_PROVIDER_SITE_OTHER)
Admission: RE | Admit: 2015-02-23 | Discharge: 2015-02-23 | Disposition: A | Discharge status: Home / Self Care | Payer: Medicare Other | Source: Ambulatory Visit | Attending: Cardiology | Admitting: Cardiology

## 2015-02-23 ENCOUNTER — Other Ambulatory Visit: Payer: Self-pay | Admitting: Cardiology

## 2015-02-23 DIAGNOSIS — R1900 Intra-abdominal and pelvic swelling, mass and lump, unspecified site: Secondary | ICD-10-CM

## 2015-02-23 MED ORDER — IOHEXOL 300 MG/ML  SOLN
100.0000 mL | Freq: Once | INTRAMUSCULAR | Status: AC | PRN
  Administered 2015-02-23: 80 mL via INTRAVENOUS

## 2015-02-23 NOTE — Telephone Encounter (Signed)
amLODipine (NORVASC) 2.5 MG tablet 30 tablet 6 08/08/2014      Sig - Route: Take 1 tablet (2.5 mg total) by mouth daily. - Oral    E-Prescribing Status: Receipt confirmed by pharmacy (08/08/2014 3:31 PM EST)

## 2015-02-28 ENCOUNTER — Other Ambulatory Visit: Payer: Self-pay | Admitting: Cardiology

## 2015-03-23 ENCOUNTER — Telehealth: Payer: Self-pay | Admitting: Internal Medicine

## 2015-03-23 ENCOUNTER — Other Ambulatory Visit: Payer: Self-pay | Admitting: Geriatric Medicine

## 2015-03-23 MED ORDER — PANTOPRAZOLE SODIUM 40 MG PO TBEC: 40.0000 mg | DELAYED_RELEASE_TABLET | Freq: Two times a day (BID) | ORAL | Status: DC

## 2015-03-23 NOTE — Telephone Encounter (Signed)
Sent to pharmacy 

## 2015-03-23 NOTE — Telephone Encounter (Signed)
Pt called regarding her prescription for pantoprazole (PROTONIX) 40 MG tablet [[id number] She says you need to file a report to [facility] so she can get a lower price since she is taking double the amount They told her that you can pull a form from them and send it to them.  Please call patient at [phone]  with any questions.

## 2015-04-06 ENCOUNTER — Other Ambulatory Visit: Payer: Self-pay

## 2015-04-06 MED ORDER — PANTOPRAZOLE SODIUM 40 MG PO TBEC: 40.0000 mg | DELAYED_RELEASE_TABLET | Freq: Two times a day (BID) | ORAL | Status: DC

## 2015-04-06 NOTE — Telephone Encounter (Signed)
Resent rx to show the same.

## 2015-04-06 NOTE — Telephone Encounter (Signed)
Customer service number to start appeal: [phone]   Spoke to pcp and according to the last ov notes the pt should be taking the pantoprazole 40 mg once per day.   LVM for pt to call back as soon as possible.  RE: confirming 40 mg daily of protonix.

## 2015-04-06 NOTE — Telephone Encounter (Signed)
Spoke to pt and she stated that she did not know that she was suppose to take it only once per day.  Stated that seh will begin that now.

## 2015-04-06 NOTE — Addendum Note (Signed)
Addended by: Lowella Dandy on: 04/06/2015 04:57 PM   Modules accepted: Orders

## 2015-04-10 NOTE — Telephone Encounter (Signed)
Pt informed

## 2015-08-04 ENCOUNTER — Encounter: Payer: Self-pay | Admitting: Physician Assistant

## 2015-08-04 ENCOUNTER — Ambulatory Visit (INDEPENDENT_AMBULATORY_CARE_PROVIDER_SITE_OTHER): Payer: Medicare Other | Admitting: Physician Assistant

## 2015-08-04 VITALS — BP 150/80 | HR 70 | Ht 62.0 in | Wt 109.0 lb

## 2015-08-04 DIAGNOSIS — R0789 Other chest pain: Secondary | ICD-10-CM

## 2015-08-04 DIAGNOSIS — R079 Chest pain, unspecified: Secondary | ICD-10-CM | POA: Diagnosis not present

## 2015-08-04 MED ORDER — NITROGLYCERIN 0.4 MG SL SUBL: 0.4000 mg | SUBLINGUAL_TABLET | SUBLINGUAL | Status: DC | PRN

## 2015-08-04 NOTE — Patient Instructions (Addendum)
Medication Instructions:  Your physician has recommended you make the following change in your medication:  1.  START Nitroglycerin 0.4 s/l tablet take as directed  Labwork: None ordered  Testing/Procedures: Your physician has requested that you have a lexiscan myoview. For further information please visit [email or web address]. Please follow instruction sheet, as given.   Follow-Up: Your physician wants you to follow-up in: 2 WEEKS AFTER THE STRESS TEST Any Other Special Instructions Will Be Listed Below (If Applicable).   If you need a refill on your cardiac medications before your next appointment, please call your pharmacy. Pharmacologic Stress Electrocardiogram A pharmacologic stress electrocardiogram is a heart (cardiac) test that uses nuclear imaging to evaluate the blood supply to your heart. This test may also be called a pharmacologic stress electrocardiography. Pharmacologic means that a medicine is used to increase your heart rate and blood pressure.  This stress test is done to find areas of poor blood flow to the heart by determining the extent of coronary artery disease (CAD). Some people exercise on a treadmill, which naturally increases the blood flow to the heart. For those people unable to exercise on a treadmill, a medicine is used. This medicine stimulates your heart and will cause your heart to beat harder and more quickly, as if you were exercising.  Pharmacologic stress tests can help determine:  The adequacy of blood flow to your heart during increased levels of activity in order to clear you for discharge home.  The extent of coronary artery blockage caused by CAD.  Your prognosis if you have suffered a heart attack.  The effectiveness of cardiac procedures done, such as an angioplasty, which can increase the circulation in your coronary arteries.  Causes of chest pain or pressure. LET [facility] CARE PROVIDER KNOW ABOUT:  Any allergies you have.  All  medicines you are taking, including vitamins, herbs, eye drops, creams, and over-the-counter medicines.  Previous problems you or members of your family have had with the use of anesthetics.  Any blood disorders you have.  Previous surgeries you have had.  Medical conditions you have.  Possibility of pregnancy, if this applies.  If you are currently breastfeeding. RISKS AND COMPLICATIONS Generally, this is a safe procedure. However, as with any procedure, complications can occur. Possible complications include:  You develop pain or pressure in the following areas:  Chest.  Jaw or neck.  Between your shoulder blades.  Radiating down your left arm.  Headache.  Dizziness or light-headedness.  Shortness of breath.  Increased or irregular heartbeat.  Low blood pressure.  Nausea or vomiting.  Flushing.  Redness going up the arm and slight pain during injection of medicine.  Heart attack (rare). BEFORE THE PROCEDURE   Avoid all forms of caffeine for 24 hours before your test or as directed by your health care provider. This includes coffee, tea (even decaffeinated tea), caffeinated sodas, chocolate, cocoa, and certain pain medicines.  Follow your health care provider's instructions regarding eating and drinking before the test.  Take your medicines as directed at regular times with water unless instructed otherwise. Exceptions may include:  If you have diabetes, ask how you are to take your insulin or pills. It is common to adjust insulin dosing the morning of the test.  If you are taking beta-blocker medicines, it is important to talk to your health care provider about these medicines well before the date of your test. Taking beta-blocker medicines may interfere with the test. In some cases, these medicines need to  be changed or stopped 24 hours or more before the test.  If you wear a nitroglycerin patch, it may need to be removed prior to the test. Ask your health  care provider if the patch should be removed before the test.  If you use an inhaler for any breathing condition, bring it with you to the test.  If you are an outpatient, bring a snack so you can eat right after the stress phase of the test.  Do not smoke for 4 hours prior to the test or as directed by your health care provider.  Do not apply lotions, powders, creams, or oils on your chest prior to the test.  Wear comfortable shoes and clothing. Let your health care provider know if you were unable to complete or follow the preparations for your test. PROCEDURE   Multiple patches (electrodes) will be put on your chest. If needed, small areas of your chest may be shaved to get better contact with the electrodes. Once the electrodes are attached to your body, multiple wires will be attached to the electrodes, and your heart rate will be monitored.  An IV access will be started. A nuclear trace (isotope) is given. The isotope may be given intravenously, or it may be swallowed. Nuclear refers to several types of radioactive isotopes, and the nuclear isotope lights up the arteries so that the nuclear images are clear. The isotope is absorbed by your body. This results in low radiation exposure.  A resting nuclear image is taken to show how your heart functions at rest.  A medicine is given through the IV access.  A second scan is done about 1 hour after the medicine injection and determines how your heart functions under stress.  During this stress phase, you will be connected to an electrocardiogram machine. Your blood pressure and oxygen levels will be monitored. AFTER THE PROCEDURE   Your heart rate and blood pressure will be monitored after the test.  You may return to your normal schedule, including diet,activities, and medicines, unless your health care provider tells you otherwise.   This information is not intended to replace advice given to you by your health care provider. Make  sure you discuss any questions you have with your health care provider.   Document Released: 11/03/2008 Document Revised: 06/22/2013 Document Reviewed: 02/22/2013 Elsevier Interactive Patient Education [employer].

## 2015-08-04 NOTE — Progress Notes (Signed)
Cardiology Office Note   Date:  08/04/2015   ID:  Vargas, Cynthia 02/05/1932, MRN [id number]  PCP:  Hoyt Koch, MD  Cardiologist:  Dr. Radford Pax   Chest pain.    History of Present Illness: Cynthia Vargas is a 82 y.o. female with a history of HTN and atypical CP secondary to GERD with normal stress test who presents today for evaluation of chest pain.  She had a nuclear stress test in 10/2013 which returned normal. She was last seen by Dr. Radford Pax and 01/2015 she was felt to be doing well from a cardiac standpoint. Her previous chest pain had resolved on a PPI. Chest pain was felt to be consistent with GERD. There was a question of a possible abdominal mass and a CT of the abdomen was ordered that showed no explanation for left lower quadrant palpable mass. Of note, CT did show cardiomegaly, a small pericardial effusion and atherosclerosis.   Today patient presents for follow-up. Last Monday she was walking the dog when she had tremendous central chest pressure associated with dizziness. No radiation or associated SOB, nausea or diaphoresis. She notices it with exertion and it gets better with rest. No dyspnea on exertion. No LE edema, orthopnea or PND. No syncope. No blood in her stool or urine. She has been compliant with her PPI. She has also been having weakness. She just feels like something has changed.    Past Medical History  Diagnosis Date  . CALCANEAL FRACTURE, RIGHT 11/04/2007  . DILATION AND CURETTAGE, HX OF 11/04/2007  . KNEE PAIN, RIGHT, CHRONIC 03/19/2010  . OSTEOPOROSIS 05/02/2007  . PEPTIC ULCER DISEASE 05/02/2007  . Personal History of Other Diseases of Digestive Disease 11/04/2007  . Unspecified essential hypertension 05/02/2007  . GERD (gastroesophageal reflux disease)     Past Surgical History  Procedure Laterality Date  . Dilation and curettage of uterus    . Cholecystectomy    . Tonsillectomy    . Calcaneal fracture, right    . Orif hip fracture      . Esophagogastroduodenoscopy N/A 03/30/2013    Procedure: ESOPHAGOGASTRODUODENOSCOPY (EGD);  Surgeon: Jerene Bears, MD;  Location: [facility];  Service: Endoscopy;  Laterality: N/A;     Current Outpatient Prescriptions  Medication Sig Dispense Refill  . acetaminophen (TYLENOL) 500 MG tablet Take 1,000 mg by mouth as needed for moderate pain or headache.     [name] amLODipine (NORVASC) 2.5 MG tablet TAKE 1 TABLET BY MOUTH EVERY DAY 30 tablet 11  . pantoprazole (PROTONIX) 40 MG tablet Take 40 mg by mouth daily.    . Wheat Dextrin (BENEFIBER DRINK MIX) PACK Take by mouth 3 (three) times daily.    . nitroGLYCERIN (NITROSTAT) 0.4 MG SL tablet Place 1 tablet (0.4 mg total) under the tongue every 5 (five) minutes as needed for chest pain. 25 tablet 3   No current facility-administered medications for this visit.    Allergies:   Peanut-containing drug products    Social History:  The patient  reports that she has never smoked. She has never used smokeless tobacco. She reports that she does not drink alcohol or use illicit drugs.   Family History:  The patient's family history includes Cancer in her father; Heart disease in her mother. There is no history of Diabetes, Hyperlipidemia, or Hypertension.    ROS:  Please see the history of present illness.   Otherwise, review of systems are positive for none.   All other systems  are reviewed and negative.    PHYSICAL EXAM: VS:  BP 150/80 mmHg  Pulse 70  Ht '5\' 2"'$  (1.575 m)  Wt 109 lb (49.442 kg)  BMI 19.93 kg/m2 , BMI Body mass index is 19.93 kg/(m^2). GEN: Well nourished, well developed, in no acute distress HEENT: normal Neck: no JVD, carotid bruits, or masses Cardiac: RRR; no murmurs, rubs, or gallops,no edema  Respiratory:  clear to auscultation bilaterally, normal work of breathing GI: soft, nontender, nondistended, + BS MS: no deformity or atrophy Skin: warm and dry, no rash Neuro:  Strength and sensation are intact Psych: euthymic  mood, full affect   EKG:  EKG is ordered today.    Recent Labs: 02/21/2015: BUN 19; Creatinine, Ser 0.88; Potassium 4.1; Sodium 135    Lipid Panel    Component Value Date/Time   CHOL 193 08/29/2014 1458   TRIG 38.0 08/29/2014 1458   HDL 72.50 08/29/2014 1458   CHOLHDL 3 08/29/2014 1458   VLDL 7.6 08/29/2014 1458   LDLCALC 113* 08/29/2014 1458   LDLDIRECT 115.1 03/19/2010 1406      Wt Readings from Last 3 Encounters:  08/04/15 109 lb (49.442 kg)  02/21/15 107 lb 12.8 oz (48.898 kg)  12/20/14 111 lb 1.9 oz (50.404 kg)      Other studies Reviewed: Additional studies/ records that were reviewed today include: MPS Review of the above records demonstrates: see HPI   ASSESSMENT AND PLAN: Cynthia Vargas is a 82 y.o. female with a history of HTN and atypical CP secondary to GERD with normal stress test who presents today for evaluation of chest pain.  1. Chest pain secondary to GERD: 2D echo showed normal LVF and no ischemia by nuclear stress test in 2015.Her CP had resolved on a PPI, but now it is back and worse despite being on a PPI. She also has dizziness. Of note, a CT of her abdomen did show cardiomegaly, a small pericardial effusion and atherosclerosis. I think we should update a nuclear stress test. I will also send her home to [facility] to see if this helps.   2. HTN: moderately controlled on amlodipine 2.'5mg'$ .   3. GERD: continue Protonix BID   4. Abdominal mass ?: a CT of the abdomen was ordered that showed no explanation for left lower quadrant palpable mass.   Current medicines are reviewed at length with the patient today.  The patient does not have concerns regarding medicines.  The following changes have been made:  no change  Labs/ tests ordered today include:   Orders Placed This Encounter  Procedures  . Myocardial Perfusion Imaging  . EKG 12-Lead    Disposition:   FU with me in 2 weeks after stress test.   Signed, Eileen Stanford, PA-C    08/04/2015 4:12 PM    [facility] [place], [place], [place]  [address] Phone: [phone]; Fax: [phone]

## 2015-08-09 ENCOUNTER — Telehealth (HOSPITAL_COMMUNITY): Payer: Self-pay | Admitting: *Deleted

## 2015-08-09 NOTE — Telephone Encounter (Signed)
Left message on voicemail in reference to upcoming appointment scheduled for 08/14/15. Phone number given for a call back so details instructions can be given. Hubbard Robinson. RN

## 2015-08-10 ENCOUNTER — Telehealth (HOSPITAL_COMMUNITY): Payer: Self-pay | Admitting: *Deleted

## 2015-08-10 NOTE — Telephone Encounter (Signed)
Left message on voicemail in reference to upcoming appointment scheduled for 08/14/15. Phone number given for a call back so details instructions can be given. Hubbard Robinson, RN

## 2015-08-14 ENCOUNTER — Ambulatory Visit (HOSPITAL_COMMUNITY): Payer: Medicare Other | Attending: Internal Medicine

## 2015-08-14 DIAGNOSIS — Z8249 Family history of ischemic heart disease and other diseases of the circulatory system: Secondary | ICD-10-CM | POA: Insufficient documentation

## 2015-08-14 DIAGNOSIS — I10 Essential (primary) hypertension: Secondary | ICD-10-CM | POA: Insufficient documentation

## 2015-08-14 DIAGNOSIS — R079 Chest pain, unspecified: Secondary | ICD-10-CM | POA: Diagnosis not present

## 2015-08-14 DIAGNOSIS — R42 Dizziness and giddiness: Secondary | ICD-10-CM | POA: Diagnosis not present

## 2015-08-14 DIAGNOSIS — R9439 Abnormal result of other cardiovascular function study: Secondary | ICD-10-CM | POA: Diagnosis not present

## 2015-08-14 LAB — MYOCARDIAL PERFUSION IMAGING
LV dias vol: 55 mL
LV sys vol: 12 mL
Peak HR: 86 {beats}/min
RATE: 0.31
Rest HR: 63 {beats}/min
SDS: 1
SRS: 12
SSS: 9
TID: 1.02

## 2015-08-14 MED ORDER — TECHNETIUM TC 99M SESTAMIBI GENERIC - CARDIOLITE
31.7000 | Freq: Once | INTRAVENOUS | Status: AC | PRN
  Administered 2015-08-14: 31.7 via INTRAVENOUS

## 2015-08-14 MED ORDER — REGADENOSON 0.4 MG/5ML IV SOLN
0.4000 mg | Freq: Once | INTRAVENOUS | Status: AC
  Administered 2015-08-14: 0.4 mg via INTRAVENOUS

## 2015-08-14 MED ORDER — TECHNETIUM TC 99M SESTAMIBI GENERIC - CARDIOLITE
10.3000 | Freq: Once | INTRAVENOUS | Status: AC | PRN
  Administered 2015-08-14: 10 via INTRAVENOUS

## 2015-08-15 ENCOUNTER — Telehealth: Payer: Self-pay | Admitting: Cardiology

## 2015-08-15 NOTE — Telephone Encounter (Signed)
Notes Recorded by Loren Racer, LPN on 0/62/6948 at 54:62 PM Informed pt of stress test results. Pt verbalized understanding.

## 2015-08-15 NOTE — Telephone Encounter (Signed)
Returning call,thinks it might be about her stress test results.

## 2015-08-27 NOTE — Progress Notes (Signed)
Cardiology Office Note   Date:  08/28/2015   ID:  Zamari, Bonsall 1932/05/14, MRN [id number]  PCP:  Hoyt Koch, MD  Cardiologist:  Dr. Radford Pax   Chest pain- follow up    History of Present Illness: Cynthia Vargas is a 82 y.o. female with a history of HTN and atypical CP secondary to GERD with normal stress test who presents today for follow up.   She had a nuclear stress test in 10/2013 which returned normal. She was last seen by Dr. Radford Pax and 01/2015 she was felt to be doing well from a cardiac standpoint. Her previous chest pain had resolved on a PPI. Chest pain was felt to be consistent with GERD. There was a question of a possible abdominal mass and a CT of the abdomen was ordered that showed no explanation for left lower quadrant palpable mass. Of note, CT did show cardiomegaly, a small pericardial effusion and atherosclerosis.   I saw her in clinic on 08/04/15 for evaluation of exertional chest pain different than her GERD. She also felt weak and "just different."  I set up a nuclear stress test which returned low risk.    Today she presents to clinic for follow up. She has not had anymore episodes of severe  chest pain. She does get a chest heaviness when she is doing physical exercise. She is just totally fatigued and weak. She is just so tired and couldn't complete her usual walk the other day. She has some good and bad days, but most days she just feels terrible with weakness and fatigue that is lifestyle limiting.  Her mother had CAD with her first event in her late 2s..    Past Medical History  Diagnosis Date  . CALCANEAL FRACTURE, RIGHT 11/04/2007  . DILATION AND CURETTAGE, HX OF 11/04/2007  . KNEE PAIN, RIGHT, CHRONIC 03/19/2010  . OSTEOPOROSIS 05/02/2007  . PEPTIC ULCER DISEASE 05/02/2007  . Personal History of Other Diseases of Digestive Disease 11/04/2007  . Unspecified essential hypertension 05/02/2007  . GERD (gastroesophageal reflux disease)      Past Surgical History  Procedure Laterality Date  . Dilation and curettage of uterus    . Cholecystectomy    . Tonsillectomy    . Calcaneal fracture, right    . Orif hip fracture    . Esophagogastroduodenoscopy N/A 03/30/2013    Procedure: ESOPHAGOGASTRODUODENOSCOPY (EGD);  Surgeon: Jerene Bears, MD;  Location: [facility];  Service: Endoscopy;  Laterality: N/A;     Current Outpatient Prescriptions  Medication Sig Dispense Refill  . acetaminophen (TYLENOL) 500 MG tablet Take 1,000 mg by mouth as needed for moderate pain or headache.     [name] amLODipine (NORVASC) 2.5 MG tablet TAKE 1 TABLET BY MOUTH EVERY DAY 30 tablet 11  . nitroGLYCERIN (NITROSTAT) 0.4 MG SL tablet Place 0.4 mg under the tongue every 5 (five) minutes as needed for chest pain (x 3 doses).    . pantoprazole (PROTONIX) 40 MG tablet Take 40 mg by mouth daily.    . Wheat Dextrin (BENEFIBER DRINK MIX) PACK Take by mouth 3 (three) times daily.     No current facility-administered medications for this visit.    Allergies:   Peanut-containing drug products    Social History:  The patient  reports that she has never smoked. She has never used smokeless tobacco. She reports that she does not drink alcohol or use illicit drugs.   Family History:  The patient's family history includes  Cancer in her father; Heart disease in her mother. There is no history of Diabetes, Hyperlipidemia, or Hypertension.    ROS:  Please see the history of present illness.   Otherwise, review of systems are positive for none.   All other systems are reviewed and negative.    PHYSICAL EXAM: VS:  BP 130/82 mmHg  Pulse 83  Ht '5\' 2"'$  (1.575 m)  Wt 108 lb 1.9 oz (49.043 kg)  BMI 19.77 kg/m2 , BMI Body mass index is 19.77 kg/(m^2). GEN: Well nourished, well developed, in no acute distress HEENT: normal Neck: no JVD, carotid bruits, or masses Cardiac: RRR; no murmurs, rubs, or gallops,no edema  Respiratory:  clear to auscultation bilaterally,  normal work of breathing GI: soft, nontender, nondistended, + BS MS: no deformity or atrophy Skin: warm and dry, no rash Neuro:  Strength and sensation are intact Psych: euthymic mood, full affect   EKG:  EKG is ordered today.    Recent Labs: 02/21/2015: BUN 19; Creatinine, Ser 0.88; Potassium 4.1; Sodium 135    Lipid Panel    Component Value Date/Time   CHOL 193 08/29/2014 1458   TRIG 38.0 08/29/2014 1458   HDL 72.50 08/29/2014 1458   CHOLHDL 3 08/29/2014 1458   VLDL 7.6 08/29/2014 1458   LDLCALC 113* 08/29/2014 1458   LDLDIRECT 115.1 03/19/2010 1406      Wt Readings from Last 3 Encounters:  08/28/15 108 lb 1.9 oz (49.043 kg)  08/14/15 109 lb (49.442 kg)  08/04/15 109 lb (49.442 kg)      Other studies Reviewed: Additional studies/ records that were reviewed today include: MPS Review of the above records demonstrates: see HPI   ASSESSMENT AND PLAN: Cynthia Vargas is a 82 y.o. female with a history of HTN and atypical CP secondary to GERD with normal stress test who presents today for evaluation of chest pain.  1. Chest pain secondary to GERD: 2D echo showed normal LVF and no ischemia by nuclear stress test in 2015.Her CP had resolved on a PPI, but returned recently. Of note, a CT of her abdomen did show cardiomegaly, a small pericardial effusion and atherosclerosis. A stress test on 08/14/15 returned with hyperdynamic EF 77% and medium defect of mild severity in the basal anteroseptal, inferoseptal and anteroseptal region but was read as low risk. I am concerned about her debilitating fatigue as well as chest heaviness with exertion. I will obtain blood work to rule out anemia or hypothyroidism as a possible cause of fatigue and weakness. If these all return normal, we will proceed with cardiac CT to rule out ischemia in the setting of a recent low risk nuclear stress test.  2. HTN: moderately controlled on amlodipine 2.'5mg'$ .   3. GERD: continue Protonix BID   4.  Abdominal mass ?: a CT of the abdomen was ordered that showed no explanation for left lower quadrant palpable mass.   Current medicines are reviewed at length with the patient today.  The patient does not have concerns regarding medicines.  The following changes have been made:  no change  Labs/ tests ordered today include:   Orders Placed This Encounter  Procedures  . CBC  . TSH  . Basic Metabolic Panel (BMET)    Disposition:   FU with me Dr. Radford Pax in 3 months if everything returns normal.   Signed, Eileen Stanford, PA-C  08/28/2015 3:10 PM    [facility] [address], [place], [place]  [address] Phone: [phone]; Fax: [phone]

## 2015-08-28 ENCOUNTER — Encounter: Payer: Self-pay | Admitting: Physician Assistant

## 2015-08-28 ENCOUNTER — Ambulatory Visit (INDEPENDENT_AMBULATORY_CARE_PROVIDER_SITE_OTHER): Payer: Medicare Other | Admitting: Physician Assistant

## 2015-08-28 VITALS — BP 130/82 | HR 83 | Ht 62.0 in | Wt 108.1 lb

## 2015-08-28 DIAGNOSIS — R5383 Other fatigue: Secondary | ICD-10-CM

## 2015-08-28 LAB — TSH: TSH: 5.76 mIU/L — ABNORMAL HIGH

## 2015-08-28 LAB — CBC
HCT: 37.5 % (ref 36.0–46.0)
Hemoglobin: 12.3 g/dL (ref 12.0–15.0)
MCH: 29.4 pg (ref 26.0–34.0)
MCHC: 32.8 g/dL (ref 30.0–36.0)
MCV: 89.7 fL (ref 78.0–100.0)
MPV: 10.1 fL (ref 8.6–12.4)
Platelets: 255 10*3/uL (ref 150–400)
RBC: 4.18 MIL/uL (ref 3.87–5.11)
RDW: 14.2 % (ref 11.5–15.5)
WBC: 3.9 10*3/uL — ABNORMAL LOW (ref 4.0–10.5)

## 2015-08-28 LAB — BASIC METABOLIC PANEL
BUN: 16 mg/dL (ref 7–25)
CO2: 29 mmol/L (ref 20–31)
Calcium: 9.4 mg/dL (ref 8.6–10.4)
Chloride: 102 mmol/L (ref 98–110)
Creat: 0.84 mg/dL (ref 0.60–0.88)
Glucose, Bld: 80 mg/dL (ref 65–99)
Potassium: 4.6 mmol/L (ref 3.5–5.3)
Sodium: 138 mmol/L (ref 135–146)

## 2015-08-28 NOTE — Patient Instructions (Signed)
Medication Instructions:  None  Labwork: BMET, CBC and TSH today  Testing/Procedures: None  Follow-Up: Your physician recommends that you schedule a follow-up appointment in: 3 months with Dr. Radford Pax.   Any Other Special Instructions Will Be Listed Below (If Applicable).     If you need a refill on your cardiac medications before your next appointment, please call your pharmacy.

## 2015-08-29 ENCOUNTER — Other Ambulatory Visit: Payer: Self-pay | Admitting: Physician Assistant

## 2015-08-29 DIAGNOSIS — R079 Chest pain, unspecified: Secondary | ICD-10-CM

## 2015-08-29 DIAGNOSIS — R5383 Other fatigue: Secondary | ICD-10-CM

## 2015-08-29 DIAGNOSIS — IMO0001 Reserved for inherently not codable concepts without codable children: Secondary | ICD-10-CM

## 2015-08-29 DIAGNOSIS — Z8249 Family history of ischemic heart disease and other diseases of the circulatory system: Secondary | ICD-10-CM

## 2015-08-29 DIAGNOSIS — I10 Essential (primary) hypertension: Secondary | ICD-10-CM

## 2015-08-29 DIAGNOSIS — R0602 Shortness of breath: Secondary | ICD-10-CM

## 2015-08-29 DIAGNOSIS — I2510 Atherosclerotic heart disease of native coronary artery without angina pectoris: Secondary | ICD-10-CM

## 2015-09-04 ENCOUNTER — Encounter: Payer: Self-pay | Admitting: Cardiology

## 2015-09-11 ENCOUNTER — Ambulatory Visit (HOSPITAL_COMMUNITY): Payer: Medicare Other

## 2015-09-14 ENCOUNTER — Encounter (HOSPITAL_COMMUNITY): Payer: Self-pay

## 2015-09-14 ENCOUNTER — Ambulatory Visit (HOSPITAL_COMMUNITY)
Admission: RE | Admit: 2015-09-14 | Discharge: 2015-09-14 | Disposition: A | Discharge status: Home / Self Care | Payer: Medicare Other | Source: Ambulatory Visit | Attending: Physician Assistant | Admitting: Physician Assistant

## 2015-09-14 DIAGNOSIS — I2510 Atherosclerotic heart disease of native coronary artery without angina pectoris: Secondary | ICD-10-CM | POA: Diagnosis not present

## 2015-09-14 DIAGNOSIS — Z8249 Family history of ischemic heart disease and other diseases of the circulatory system: Secondary | ICD-10-CM | POA: Diagnosis not present

## 2015-09-14 DIAGNOSIS — R5383 Other fatigue: Secondary | ICD-10-CM

## 2015-09-14 DIAGNOSIS — R918 Other nonspecific abnormal finding of lung field: Secondary | ICD-10-CM | POA: Insufficient documentation

## 2015-09-14 DIAGNOSIS — R079 Chest pain, unspecified: Secondary | ICD-10-CM | POA: Diagnosis not present

## 2015-09-14 DIAGNOSIS — IMO0001 Reserved for inherently not codable concepts without codable children: Secondary | ICD-10-CM

## 2015-09-14 DIAGNOSIS — R0602 Shortness of breath: Secondary | ICD-10-CM | POA: Diagnosis not present

## 2015-09-14 DIAGNOSIS — Z136 Encounter for screening for cardiovascular disorders: Secondary | ICD-10-CM | POA: Insufficient documentation

## 2015-09-14 DIAGNOSIS — I10 Essential (primary) hypertension: Secondary | ICD-10-CM | POA: Diagnosis not present

## 2015-09-14 MED ORDER — NITROGLYCERIN 0.4 MG SL SUBL
SUBLINGUAL_TABLET | SUBLINGUAL | Status: AC
  Filled 2015-09-14: qty 1

## 2015-09-14 MED ORDER — METOPROLOL TARTRATE 1 MG/ML IV SOLN
5.0000 mg | Freq: Once | INTRAVENOUS | Status: AC
  Administered 2015-09-14: 5 mg via INTRAVENOUS
  Filled 2015-09-14: qty 5

## 2015-09-14 MED ORDER — NITROGLYCERIN 0.4 MG SL SUBL
0.4000 mg | SUBLINGUAL_TABLET | Freq: Once | SUBLINGUAL | Status: AC
  Administered 2015-09-14: 0.4 mg via SUBLINGUAL
  Filled 2015-09-14: qty 25

## 2015-09-14 MED ORDER — IOHEXOL 350 MG/ML SOLN
80.0000 mL | Freq: Once | INTRAVENOUS | Status: AC | PRN
  Administered 2015-09-14: 80 mL via INTRAVENOUS

## 2015-09-14 MED ORDER — METOPROLOL TARTRATE 1 MG/ML IV SOLN
INTRAVENOUS | Status: AC
Start: 2015-09-14 — End: 2015-09-14
  Filled 2015-09-14: qty 5

## 2015-09-14 NOTE — Progress Notes (Signed)
CT completed. Tolerated well. D/C home walking, awake and alert. In no distress.

## 2015-09-15 ENCOUNTER — Other Ambulatory Visit: Payer: Self-pay | Admitting: *Deleted

## 2015-09-15 ENCOUNTER — Telehealth: Payer: Self-pay | Admitting: Cardiology

## 2015-09-15 DIAGNOSIS — I2510 Atherosclerotic heart disease of native coronary artery without angina pectoris: Secondary | ICD-10-CM

## 2015-09-15 DIAGNOSIS — I2584 Coronary atherosclerosis due to calcified coronary lesion: Secondary | ICD-10-CM

## 2015-09-15 DIAGNOSIS — R931 Abnormal findings on diagnostic imaging of heart and coronary circulation: Secondary | ICD-10-CM

## 2015-09-15 DIAGNOSIS — R911 Solitary pulmonary nodule: Secondary | ICD-10-CM

## 2015-09-15 NOTE — Telephone Encounter (Signed)
Scheduled patient 09/18/15 for FASTING LP, ALT and HgBA1C. Referral for Dr. Servando Snare placed. PET-CT ordered for pre-cert/scheduling. Reiterated the necessity for aggressive risk factor modification. Patient agrees with treatment plan.

## 2015-09-15 NOTE — Telephone Encounter (Signed)
-----   Message from Sueanne Margarita, MD sent at 09/15/2015 11:40 AM EDT ----- I have talked at length with the patient concerning the results of CT.  She has coronary artery calcium score of 101 Agatston units with intermediate risk of future cardiac events.  She needs aggressive risk factor modification.  Please have her come in for FLP and ALT and HbGA1C.  BP controlled at last OV.  I also reviewed the findings of a spiculated mass in the RUL of her lung that is very worrisome for bronchogenic CA which could explain her weight loss.  Please refer to Dr. Servando Snare with CVTS surgery ASAP and order a PET-CT scan for further evaluation.

## 2015-09-15 NOTE — Addendum Note (Signed)
Addended by: Harland German A on: 09/15/2015 05:11 PM   Modules accepted: Orders

## 2015-09-15 NOTE — Telephone Encounter (Signed)
Left message to call back  

## 2015-09-15 NOTE — Telephone Encounter (Signed)
New message   Pt calling for scheduling of biopsy

## 2015-09-18 ENCOUNTER — Other Ambulatory Visit (INDEPENDENT_AMBULATORY_CARE_PROVIDER_SITE_OTHER): Payer: Medicare Other | Admitting: *Deleted

## 2015-09-18 DIAGNOSIS — R938 Abnormal findings on diagnostic imaging of other specified body structures: Secondary | ICD-10-CM | POA: Diagnosis not present

## 2015-09-18 DIAGNOSIS — I2584 Coronary atherosclerosis due to calcified coronary lesion: Secondary | ICD-10-CM | POA: Diagnosis not present

## 2015-09-18 DIAGNOSIS — I2510 Atherosclerotic heart disease of native coronary artery without angina pectoris: Secondary | ICD-10-CM | POA: Diagnosis not present

## 2015-09-18 DIAGNOSIS — Z79899 Other long term (current) drug therapy: Secondary | ICD-10-CM | POA: Diagnosis not present

## 2015-09-18 DIAGNOSIS — R931 Abnormal findings on diagnostic imaging of heart and coronary circulation: Secondary | ICD-10-CM

## 2015-09-18 LAB — LIPID PANEL
Cholesterol: 200 mg/dL (ref 125–200)
HDL: 88 mg/dL (ref >=46)
LDL Cholesterol: 103 mg/dL (ref <130)
Total CHOL/HDL Ratio: 2.3 Ratio (ref <=5.0)
Triglycerides: 47 mg/dL (ref <150)
VLDL: 9 mg/dL (ref <30)

## 2015-09-18 LAB — HEMOGLOBIN A1C
Hgb A1c MFr Bld: 5.7 % — ABNORMAL HIGH (ref <5.7)
Mean Plasma Glucose: 117 mg/dL — ABNORMAL HIGH (ref <117)

## 2015-09-18 LAB — ALT: ALT: 13 U/L (ref 6–29)

## 2015-09-18 NOTE — Telephone Encounter (Signed)
OV with Dr. Servando Snare scheduled 09/26/15.

## 2015-09-18 NOTE — Telephone Encounter (Signed)
Confirmed with patient PET scan 09/25/15. She understands to be NPO 6 hours prior to test and to be at [facility] between 1130 and 1145.

## 2015-09-22 ENCOUNTER — Telehealth: Payer: Self-pay | Admitting: *Deleted

## 2015-09-22 DIAGNOSIS — Z0000 Encounter for general adult medical examination without abnormal findings: Secondary | ICD-10-CM

## 2015-09-22 DIAGNOSIS — I10 Essential (primary) hypertension: Secondary | ICD-10-CM

## 2015-09-22 MED ORDER — ATORVASTATIN CALCIUM 20 MG PO TABS: 20.0000 mg | ORAL_TABLET | Freq: Every day | ORAL | Status: DC

## 2015-09-22 NOTE — Telephone Encounter (Signed)
Pt notified of lab results and is agreeable to starting Lipitor 20 mg daily, Rx sent, LFT/FLP 11/24/15, confirmed Dr. Radford Pax appt 11/28/15 @ 2:30.

## 2015-09-25 ENCOUNTER — Ambulatory Visit (HOSPITAL_COMMUNITY)
Admission: RE | Admit: 2015-09-25 | Discharge: 2015-09-25 | Disposition: A | Discharge status: Home / Self Care | Payer: Medicare Other | Source: Ambulatory Visit | Attending: Cardiology | Admitting: Cardiology

## 2015-09-25 ENCOUNTER — Other Ambulatory Visit: Payer: Self-pay | Admitting: Cardiology

## 2015-09-25 DIAGNOSIS — R911 Solitary pulmonary nodule: Secondary | ICD-10-CM | POA: Diagnosis present

## 2015-09-25 DIAGNOSIS — D649 Anemia, unspecified: Secondary | ICD-10-CM | POA: Diagnosis not present

## 2015-09-25 DIAGNOSIS — K219 Gastro-esophageal reflux disease without esophagitis: Secondary | ICD-10-CM | POA: Diagnosis not present

## 2015-09-25 DIAGNOSIS — R946 Abnormal results of thyroid function studies: Secondary | ICD-10-CM | POA: Diagnosis not present

## 2015-09-25 DIAGNOSIS — I10 Essential (primary) hypertension: Secondary | ICD-10-CM | POA: Diagnosis not present

## 2015-09-25 DIAGNOSIS — R079 Chest pain, unspecified: Secondary | ICD-10-CM | POA: Diagnosis not present

## 2015-09-25 DIAGNOSIS — Z79899 Other long term (current) drug therapy: Secondary | ICD-10-CM | POA: Diagnosis not present

## 2015-09-25 LAB — GLUCOSE, CAPILLARY: Glucose-Capillary: 78 mg/dL (ref 65–99)

## 2015-09-25 MED ORDER — FLUDEOXYGLUCOSE F - 18 (FDG) INJECTION
5.4200 | Freq: Once | INTRAVENOUS | Status: AC | PRN
  Administered 2015-09-25: 5.42 via INTRAVENOUS

## 2015-09-26 ENCOUNTER — Institutional Professional Consult (permissible substitution) (INDEPENDENT_AMBULATORY_CARE_PROVIDER_SITE_OTHER): Payer: Medicare Other | Admitting: Cardiothoracic Surgery

## 2015-09-26 ENCOUNTER — Encounter: Payer: Self-pay | Admitting: Cardiothoracic Surgery

## 2015-09-26 VITALS — BP 117/73 | HR 81 | Resp 16 | Ht 62.0 in | Wt 107.8 lb

## 2015-09-26 DIAGNOSIS — I2584 Coronary atherosclerosis due to calcified coronary lesion: Secondary | ICD-10-CM

## 2015-09-26 DIAGNOSIS — I2510 Atherosclerotic heart disease of native coronary artery without angina pectoris: Secondary | ICD-10-CM | POA: Diagnosis not present

## 2015-09-26 DIAGNOSIS — D381 Neoplasm of uncertain behavior of trachea, bronchus and lung: Secondary | ICD-10-CM | POA: Diagnosis not present

## 2015-09-26 NOTE — Progress Notes (Signed)
[address]       [place] [address]             [phone]                    Saumya J Fildes Auxier Medical Record #[id number] Date of Birth: 04-26-1932  Referring: Sueanne Margarita, MD Primary Care: Hoyt Koch, MD  Chief Complaint:    Chief Complaint  Patient presents with  . Lung Lesion    RUL...per CARDIAC CT 09/14/15, PET 09/25/15    History of Present Illness:    Cynthia Vargas 82 y.o. female is seen in the office  today for At the request of Dr. Radford Pax because of hypermetabolic lesions in the right upper lobe. The patient is a lifelong nonsmoker. She has bothered by several years of exertional chest pain, and significant shortness of breath with exertion. She notes that she walks her dog but becomes very short of breath and has to stop. She denies hemoptysis. She had a cardiac CT of the chest, which was moderate risk for coronary disease by calcium score but also incidentally noted to right upper lobe lung lesions. Subsequent PET scan has been obtained and shows a  Highly spiculated 2.8 x 1.5 cm right upper lobe nodule with adjacent satellite nodules, highly concerning for potential primary bronchogenic carcinoma. Unfortunately the CT scan has been deleted, so the raw data is not available for navigation bronchoscopy.    Current Activity/ Functional Status:  Patient will not be independent with mobility/ambulation, transfers, ADL's, IADL's.   Zubrod Score: At the time of surgery this patient's most appropriate activity status/level should be described as: '[]'$     0    Normal activity, no symptoms '[]'$     1    Restricted in physical strenuous activity but ambulatory, able to do out light work '[x]'$     2    Ambulatory and capable of self care, unable to do work activities, up and about               >50 % of waking hours                              '[]'$     3    Only limited self care, in bed greater than 50% of waking hours '[]'$     4     Completely disabled, no self care, confined to bed or chair '[]'$     5    Moribund   Past Medical History  Diagnosis Date  . CALCANEAL FRACTURE, RIGHT 11/04/2007  . DILATION AND CURETTAGE, HX OF 11/04/2007  . KNEE PAIN, RIGHT, CHRONIC 03/19/2010  . OSTEOPOROSIS 05/02/2007  . PEPTIC ULCER DISEASE 05/02/2007  . Personal History of Other Diseases of Digestive Disease 11/04/2007  . Unspecified essential hypertension 05/02/2007  . GERD (gastroesophageal reflux disease)     Past Surgical History  Procedure Laterality Date  . Dilation and curettage of uterus    . Cholecystectomy    . Tonsillectomy    . Calcaneal fracture, right    . Orif hip fracture    . Esophagogastroduodenoscopy N/A 03/30/2013    Procedure: ESOPHAGOGASTRODUODENOSCOPY (EGD);  Surgeon: Jerene Bears, MD;  Location: [facility];  Service: Endoscopy;  Laterality: N/A;    Family History  Problem Relation Age of Onset  . Cancer Father   . Heart disease Mother   .  Diabetes Neg Hx   . Hyperlipidemia Neg Hx   . Hypertension Neg Hx     Social History   Social History  . Marital Status: Single    Spouse Name: N/A  . Number of Children: 1  . Years of Education: 16   Occupational History  . reitred    Social History Main Topics  . Smoking status: Never Smoker   . Smokeless tobacco: Never Used  . Alcohol Use: No  . Drug Use: No  . Sexual Activity: No   Other Topics Concern  . Not on file   Social History Narrative   [employer] degrees in Development worker, community. married for 2 years then single . 1 son - Harvie Junior [place]. 2 daughters-put up for adoption. Lives alone and is independent in ADL's. works at [employer] until injured.    History  Smoking status  . Never Smoker   Smokeless tobacco  . Never Used    History  Alcohol Use No     Allergies  Allergen Reactions  . Peanut-Containing Drug Products Other (See Comments)    angioedema    Current Outpatient Prescriptions  Medication Sig Dispense Refill  . acetaminophen  (TYLENOL) 500 MG tablet Take 1,000 mg by mouth as needed for moderate pain or headache.     [name] amLODipine (NORVASC) 2.5 MG tablet TAKE 1 TABLET BY MOUTH EVERY DAY 30 tablet 11  . atorvastatin (LIPITOR) 20 MG tablet Take 1 tablet (20 mg total) by mouth daily. 30 tablet 11  . nitroGLYCERIN (NITROSTAT) 0.4 MG SL tablet Place 0.4 mg under the tongue every 5 (five) minutes as needed for chest pain (x 3 doses).    . pantoprazole (PROTONIX) 40 MG tablet Take 40 mg by mouth daily.    . Wheat Dextrin (BENEFIBER DRINK MIX) PACK Take by mouth 3 (three) times daily.     No current facility-administered medications for this visit.      Review of Systems:     Cardiac Review of Systems: Y or N  Chest Pain [  y  ]  Resting SOB [ y  ] Exertional SOB  Blue.Reese  ]  Orthopnea [ n ]   Pedal Edema Blue.Reese   ]    Palpitations Florencio.Farrier  ] Syncope  [ n ]   Presyncope [  n ]  General Review of Systems: [Y] = yes [  ]=no Constitional: recent weight change [  ];  Wt loss over the last 3 months [   ] anorexia [  ]; fatigue [ y ]; nausea [  ]; night sweats [  ]; fever [  ]; or chills [  ];          Dental: poor dentition[  ]; Last Dentist visit:   Eye : blurred vision [  ]; diplopia [   ]; vision changes [  ];  Amaurosis fugax[  ]; Resp: cough Blue.Reese  ];  wheezing[y];  hemoptysis[n  ]; shortness of breath[y  ]; paroxysmal nocturnal dyspnea[ y ]; dyspnea on exertion[y  ]; or orthopnea[  ];  GI:  gallstones[  ], vomiting[  ];  dysphagia[  ]; melena[  ];  hematochezia [  ]; heartburn[  ];   Hx of  Colonoscopy[  ]; GU: kidney stones [  ]; hematuria[  ];   dysuria [  ];  nocturia[  ];  history of     obstruction [  ]; urinary frequency [  ]  Skin: rash, swelling[  ];, hair loss[  ];  peripheral edema[  ];  or itching[  ]; Musculosketetal: myalgias[  ];  joint swelling[  ];  joint erythema[  ];  joint pain[  ];  back pain[  ];  Heme/Lymph: bruising[  ];  bleeding[  ];  anemia[  ];  Neuro: TIA[n  ];  headaches[ n ];  stroke[ n ];   vertigo[  ];  seizures[ n ];   paresthesias[  ];  difficulty walking[ yes with cane ];  Psych:depression[  ]; anxiety[  ];  Endocrine: diabetes[  ];  thyroid dysfunction[  ];  Immunizations: Flu up to date [  ]; Pneumococcal up to date [  ];  Other:  Physical Exam: BP 117/73 mmHg  Pulse 81  Resp 16  Ht '5\' 2"'$  (1.575 m)  Wt 107 lb 12.8 oz (48.898 kg)  BMI 19.71 kg/m2  SpO2 97%  PHYSICAL EXAMINATION: General appearance: alert, cooperative, appears older than stated age and fatigued Head: Normocephalic, without obvious abnormality, atraumatic Neck: no adenopathy, no carotid bruit, no JVD, supple, symmetrical, trachea midline and thyroid not enlarged, symmetric, no tenderness/mass/nodules Lymph nodes: Cervical, supraclavicular, and axillary nodes normal. Resp: clear to auscultation bilaterally Back: symmetric, no curvature. ROM normal. No CVA tenderness. Cardio: regular rate and rhythm, S1, S2 normal, no murmur, click, rub or gallop GI: soft, non-tender; bowel sounds normal; no masses,  no organomegaly Extremities: extremities normal, atraumatic, no cyanosis or edema and Homans sign is negative, no sign of DVT Neurologic: Grossly normal Patient has poor gait difficulty standing from a sitting position and difficulty walking around in the office, is very slow and uses a walker  Diagnostic Studies & Laboratory data:     Recent Radiology Findings:   Nm Pet Image Initial (pi) Skull Base To Thigh  09/25/2015  CLINICAL DATA:  Initial treatment strategy for lung nodule. EXAM: NUCLEAR MEDICINE PET SKULL BASE TO THIGH TECHNIQUE: 5.4 mCi F-18 FDG was injected intravenously. Full-ring PET imaging was performed from the skull base to thigh after the radiotracer. CT data was obtained and used for attenuation correction and anatomic localization. FASTING BLOOD GLUCOSE:  Value: 78 mg/dl COMPARISON:  Coronary CTA 09/14/2015. FINDINGS: NECK No hypermetabolic lymph nodes in the neck. CT images show no acute  findings. CHEST Dominant spiculated nodular lesion in the perihilar right upper lobe measures 1.0 x 2.3 cm, stable when remeasured, with an SUV max of 9.7. Adjacent nodule (CT image 30, series 6) is also hypermetabolic. No hypermetabolic mediastinal, hilar or axillary lymph nodes. No additional hypermetabolic pulmonary nodules. CT images show coronary artery calcification. Small amount of pericardial fluid is likely physiologic. No pleural effusion. ABDOMEN/PELVIS No abnormal hypermetabolism in the liver, adrenal glands, spleen or pancreas. No hypermetabolic lymph nodes. CT images show the liver and adrenal glands to be grossly unremarkable. Cholecystectomy. Scarring in the right kidney. Kidneys, spleen, pancreas, stomach and bowel are grossly unremarkable. SKELETON No abnormal osseous hypermetabolism. IMPRESSION: 1. Hypermetabolic nodularity in the right upper lobe, worrisome for primary bronchogenic carcinoma. An infectious process is not excluded but is considered less likely. 2. Coronary artery calcification, recently evaluated on 09/14/2015. Electronically Signed   By: Lorin Picket M.D.   On: 09/25/2015 13:09   Ct Coronary Morp W/cta Cor W/score W/ca W/cm &/or Wo/cm  09/14/2015  ADDENDUM REPORT: 09/14/2015 23:17 CLINICAL DATA:  Chest pain EXAM: Cardiac CTA MEDICATIONS: Sub lingual nitro. '4mg'$  and lopressor '5mg'$  IV x 1 TECHNIQUE: The patient was scanned on a Philips  932 slice scanner. Gantry rotation speed was 270 msecs. Collimation was .30m. A 100 kV prospective scan was triggered in the descending thoracic aorta at 111 HU's with 5% padding centered around 78% of the R-R interval. Average HR during the scan was 65 bpm. The 3D data set was interpreted on a dedicated work station using MPR, MIP and VRT modes. A total of 80cc of contrast was used. FINDINGS: [facility]-cardiac: [facility]. Calcium Score:  101 Agatston units. Coronary Arteries: Right dominant with no anomalies LM:  There was calcified plaque at the left main ostium with no more than mild stenosis. LAD system: Mixed plaque in the proximal LAD with mild stenosis. No significant disease in the remainder of the LAD. Circumflex system: No plaque or stenosis. Smalll OM1, moderate OM2, and large PLOM noted. RCA: Motion artifact affects the distal RCA, but the vessel is interpretable. No plaque or stenosis noted. IMPRESSION: 1.  Nonobstructive coronary disease. 2. Coronary artery calcium score of 101 Agatston units, placing the patient in the 54th percentile for age and gender. This suggests intermediate risk of future cardiac events. Dalton Mclean Electronically Signed   By: DLoralie ChampagneM.D.   On: 09/14/2015 23:17  09/14/2015  EXAM: OVER-READ INTERPRETATION  CT CHEST The following report is an over-read performed by radiologist Dr. DRebekah Chester[facility] on 09/14/2015. This over-read does not include interpretation of cardiac or coronary anatomy or pathology. The coronary calcium score/coronary CTA interpretation by the cardiologist is attached. COMPARISON:  No priors. FINDINGS: In the inferior aspect of the right upper lobe there is a 2.8 x 1.5 cm highly spiculated nodule (image 11 of series 204) which is highly concerning for primary bronchogenic carcinoma. There are satellite nodules emanating outward from this lesion in the right upper lobe, the largest of which measures up to 10 mm. Small 3 mm nodule associated with the minor fissure is nonspecific and may simply represent a subpleural lymph node. A few other scattered tiny 1-2 mm pulmonary nodules are noted throughout the lungs bilaterally, typically in the periphery, likely to represent areas of mucoid impaction within terminal bronchioles. No consolidative airspace disease. No pleural effusions. Within the visualized portions of the thorax there is no definite mediastinal or hilar lymphadenopathy. Visualized portions of the upper abdomen are  unremarkable. There are no aggressive appearing lytic or blastic lesions noted in the visualized portions of the skeleton. IMPRESSION: 1. Highly spiculated 2.8 x 1.5 cm right upper lobe nodule with adjacent satellite nodules, highly concerning for potential primary bronchogenic carcinoma. Further evaluation with PET-CT and/or biopsy is strongly recommended in the near future for diagnostic and staging purposes. These results will be called to the ordering clinician or representative by the Radiologist Assistant, and communication documented in the PACS or zVision Dashboard. Electronically Signed: By: DVinnie LangtonM.D. On: 09/14/2015 13:07   I have independently reviewed the above radiology studies  and reviewed the findings with the patient.   Recent Lab Findings: Lab Results  Component Value Date   WBC 3.9* 08/28/2015   HGB 12.3 08/28/2015   HCT 37.5 08/28/2015   PLT 255 08/28/2015   GLUCOSE 80 08/28/2015   CHOL 200 09/18/2015   TRIG 47 09/18/2015   HDL 88 09/18/2015   LDLDIRECT 115.1 03/19/2010   LDLCALC 103 09/18/2015   ALT 13 09/18/2015   AST 19 04/07/2013   NA 138 08/28/2015   K 4.6 08/28/2015   CL 102 08/28/2015   CREATININE 0.84 08/28/2015  BUN 16 08/28/2015   CO2 29 08/28/2015   TSH 5.76* 08/28/2015   INR 1.0 03/17/2007   HGBA1C 5.7* 09/18/2015      Assessment / Plan:   Clinical stage II B carcinoma of the lung, T3 lesion with 2 nodules in the same lobe, At this point we have no pulmonary function studies need a repeat CT scan as the for navigation bronchoscopy purposes as the CT done at the raw data will start. I discussed with the patient the probable diagnosis, however all fragility noted in the office today would make her a poor candidate at age 53 for lobectomy. I recommended that we evaluate her pulmonary function studies, and received with a navigation bronchoscopy ENB and tried to obtain a tissue diagnosis of at least one of the lesions. Potentially this will be  on Friday, March 31. Following obtaining a tissue diagnosis we'll have her seen in the multidisciplinary thoracic oncology clinic And review treatment options.    I  spent 40 minutes counseling the patient face to face and 50% or more the  time was spent in counseling and coordination of care. The total time spent in the appointment was 60 minutes.  Grace Isaac MD      [address] [place] [address] Office [phone]   Beeper [phone]  09/26/2015 5:12 PM

## 2015-09-27 ENCOUNTER — Other Ambulatory Visit: Payer: Self-pay | Admitting: *Deleted

## 2015-09-27 DIAGNOSIS — R911 Solitary pulmonary nodule: Secondary | ICD-10-CM

## 2015-09-28 ENCOUNTER — Ambulatory Visit
Admission: RE | Admit: 2015-09-28 | Discharge: 2015-09-28 | Disposition: A | Discharge status: Home / Self Care | Payer: Medicare Other | Source: Ambulatory Visit | Attending: Cardiothoracic Surgery | Admitting: Cardiothoracic Surgery

## 2015-09-28 ENCOUNTER — Encounter (HOSPITAL_COMMUNITY)
Admission: RE | Admit: 2015-09-28 | Discharge: 2015-09-28 | Disposition: A | Discharge status: Home / Self Care | Payer: Medicare Other | Source: Ambulatory Visit | Attending: Cardiothoracic Surgery | Admitting: Cardiothoracic Surgery

## 2015-09-28 ENCOUNTER — Ambulatory Visit (HOSPITAL_COMMUNITY)
Admission: RE | Admit: 2015-09-28 | Discharge: 2015-09-28 | Disposition: A | Discharge status: Home / Self Care | Payer: Medicare Other | Source: Ambulatory Visit | Attending: Cardiothoracic Surgery | Admitting: Cardiothoracic Surgery

## 2015-09-28 ENCOUNTER — Encounter (HOSPITAL_COMMUNITY): Payer: Self-pay

## 2015-09-28 VITALS — BP 150/74 | HR 69 | Temp 97.4°F | Resp 16 | Ht 62.0 in | Wt 107.8 lb

## 2015-09-28 DIAGNOSIS — I10 Essential (primary) hypertension: Secondary | ICD-10-CM | POA: Diagnosis not present

## 2015-09-28 DIAGNOSIS — D649 Anemia, unspecified: Secondary | ICD-10-CM | POA: Diagnosis not present

## 2015-09-28 DIAGNOSIS — Z79899 Other long term (current) drug therapy: Secondary | ICD-10-CM | POA: Diagnosis not present

## 2015-09-28 DIAGNOSIS — R911 Solitary pulmonary nodule: Secondary | ICD-10-CM

## 2015-09-28 DIAGNOSIS — R946 Abnormal results of thyroid function studies: Secondary | ICD-10-CM | POA: Diagnosis not present

## 2015-09-28 DIAGNOSIS — K219 Gastro-esophageal reflux disease without esophagitis: Secondary | ICD-10-CM | POA: Diagnosis not present

## 2015-09-28 LAB — PULMONARY FUNCTION TEST
DL/VA % pred: 78 %
DL/VA: 3.57 ml/min/mmHg/L
DLCO cor % pred: 53 %
DLCO cor: 11.6 ml/min/mmHg
DLCO unc % pred: 53 %
DLCO unc: 11.46 ml/min/mmHg
FEF 25-75 Post: 1.3 L/sec
FEF 25-75 Pre: 0.67 L/sec
FEF2575-%Change-Post: 94 %
FEF2575-%Pred-Post: 107 %
FEF2575-%Pred-Pre: 55 %
FEV1-%Change-Post: 29 %
FEV1-%Pred-Post: 94 %
FEV1-%Pred-Pre: 72 %
FEV1-Post: 1.59 L
FEV1-Pre: 1.23 L
FEV1FVC-%Change-Post: 20 %
FEV1FVC-%Pred-Pre: 80 %
FEV6-%Change-Post: 4 %
FEV6-%Pred-Post: 101 %
FEV6-%Pred-Pre: 97 %
FEV6-Post: 2.2 L
FEV6-Pre: 2.1 L
FEV6FVC-%Pred-Post: 106 %
FEV6FVC-%Pred-Pre: 106 %
FVC-%Change-Post: 7 %
FVC-%Pred-Post: 98 %
FVC-%Pred-Pre: 91 %
FVC-Post: 2.25 L
FVC-Pre: 2.1 L
Post FEV1/FVC ratio: 70 %
Post FEV6/FVC ratio: 100 %
Pre FEV1/FVC ratio: 59 %
Pre FEV6/FVC Ratio: 100 %
RV % pred: 276 %
RV: 6.42 L
TLC % pred: 181 %
TLC: 8.63 L

## 2015-09-28 LAB — COMPREHENSIVE METABOLIC PANEL
ALT: 16 U/L (ref 14–54)
AST: 29 U/L (ref 15–41)
Albumin: 4.3 g/dL (ref 3.5–5.0)
Alkaline Phosphatase: 47 U/L (ref 38–126)
Anion gap: 8 (ref 5–15)
BUN: 12 mg/dL (ref 6–20)
CO2: 28 mmol/L (ref 22–32)
Calcium: 9.3 mg/dL (ref 8.9–10.3)
Chloride: 105 mmol/L (ref 101–111)
Creatinine, Ser: 0.82 mg/dL (ref 0.44–1.00)
GFR calc Af Amer: >60 mL/min (ref >60)
GFR calc non Af Amer: >60 mL/min (ref >60)
Glucose, Bld: 92 mg/dL (ref 65–99)
Potassium: 3.6 mmol/L (ref 3.5–5.1)
Sodium: 141 mmol/L (ref 135–145)
Total Bilirubin: 0.7 mg/dL (ref 0.3–1.2)
Total Protein: 7.1 g/dL (ref 6.5–8.1)

## 2015-09-28 LAB — CBC
HCT: 41.1 % (ref 36.0–46.0)
Hemoglobin: 13 g/dL (ref 12.0–15.0)
MCH: 29.1 pg (ref 26.0–34.0)
MCHC: 31.6 g/dL (ref 30.0–36.0)
MCV: 92.2 fL (ref 78.0–100.0)
Platelets: 231 10*3/uL (ref 150–400)
RBC: 4.46 MIL/uL (ref 3.87–5.11)
RDW: 13.9 % (ref 11.5–15.5)
WBC: 3.8 10*3/uL — ABNORMAL LOW (ref 4.0–10.5)

## 2015-09-28 LAB — SURGICAL PCR SCREEN
MRSA, PCR: NEGATIVE
Staphylococcus aureus: NEGATIVE

## 2015-09-28 LAB — APTT: aPTT: 33 seconds (ref 24–37)

## 2015-09-28 LAB — PROTIME-INR
INR: 1.08 (ref 0.00–1.49)
Prothrombin Time: 14.2 seconds (ref 11.6–15.2)

## 2015-09-28 MED ORDER — ALBUTEROL SULFATE (2.5 MG/3ML) 0.083% IN NEBU
2.5000 mg | INHALATION_SOLUTION | Freq: Once | RESPIRATORY_TRACT | Status: AC
  Administered 2015-09-28: 2.5 mg via RESPIRATORY_TRACT

## 2015-09-28 NOTE — Progress Notes (Addendum)
Cardiologist Dr Golden Hurter  Last seen a month ago.  She knows pt is having surgery  PCP Dr Pricilla Holm [facility] on [place]) Last seen over a year ago  Stress 08/14/15 in EPIC ECHO 10/29/15 in EPIC EKG 08/3815 in EPIC CXR to be done today  Record reviewed by Myra Gianotti, PA

## 2015-09-28 NOTE — Pre-Procedure Instructions (Signed)
    Cynthia Vargas [facility]  09/28/2015      [facility] - [place], [address] AT [facility] OF [place] [address] [place] [address] Phone: [phone] Fax: [phone]    Your procedure is scheduled on Friday March 31st Your scheduled time is 7:30-9:30  Report to [facility] Admitting at 5:30 A.M.  Call this number if you have problems the morning of surgery:  [phone]   Remember:  Do not eat food or drink liquids after midnight.  Take these medicines the morning of surgery with A SIP OF WATER: Amlodipine (Norvasc), Protonix (Pantoprazole), and Tylenol if needed Stop taking any Aspirin or aspirin containing products, vitamins supplements, herbal medications, and Nsaids such as Aleve, Motrin, Advil or Naproxen   Do not wear jewelry, make-up or nail polish.  Do not wear lotions, powders, or perfumes.  You may not wear deodorant.  Do not shave 48 hours prior to surgery.    Do not bring valuables to the hospital.  [facility] is not responsible for any belongings or valuables.  Contacts, dentures or bridgework may not be worn into surgery.   Patients discharged the day of surgery will not be allowed to drive home.   Special instructions:  Shower with CHG as instructed   Please read over the following fact sheets that you were given. Pain Booklet, Coughing and Deep Breathing and Surgical Site Infection Prevention

## 2015-09-28 NOTE — Progress Notes (Signed)
Anesthesia Chart Review: Patient is a 82 year old female scheduled for video bronchoscopy with endobronchial navigation, lung biopsy, possible placement of fiducial markers tomorrow by Dr. Servando Snare. She recently had an incidental finding of a spiculated RUL nodule on coronary CT. Lesion was hypermetabolic on PET. Biopsy recommended for tissue diagnosis.  History includes non-smoker, osteoporosis, PUD, duodenal ulcer with acute GIB and syncope '14, GERD, HTN, cholecystectomy, tonsillectomy. PCP is Dr. Pricilla Holm with [facility]. GI is Dr. Kelly Splinter. Cardiologist is Dr. Fransico Him with recent evaluation for chest pain (see testing below).   Meds include amlodipine, Lipitor, Nitro, Protonix.  08/04/15 EKG: NSR, low voltage QRS, non-specific ST abnormality.  09/14/15 Coronary CT: IMPRESSION: CARDIAC FINDINGS: 1. Right dominant. Nonobstructive coronary disease. Mild LM stenosis. Mild proximal LAD stenosis. No plaque or stenosis noted in the CX system and RCA. 2. Coronary artery calcium score of 101 Agatston units, placing the patient in the 54th percentile for age and gender. This suggests intermediate risk of future cardiac events. NON-CARDIAC FINDINGS: IMPRESSION: 1. Highly spiculated 2.8 x 1.5 cm right upper lobe nodule with adjacent satellite nodules, highly concerning for potential primary bronchogenic carcinoma. Further evaluation with PET-CT and/or biopsy is strongly recommended in the near future for diagnostic and staging purposes. (Dr. Radford Pax recommended aggressive risk factor modification for her elevated coronary calcium score and ASAP referral to [facility] for further evaluation of her lung lesion.)  08/14/15 Nuclear stress test:  Nuclear stress EF: 77%.  There was no ST segment deviation noted during stress.  Defect 1: There is a medium defect of mild severity present in the basal anteroseptal, basal inferoseptal, mid anteroseptal and mid inferoseptal location. The  defect is non-reversible.  This is a low risk study.  The left ventricular ejection fraction is hyperdynamic (>65%).  A prior study conducted on 10/28/2013. There are no significant changes in comparison to prior study.  10/28/13 Echo: Study Conclusions - Left ventricle: The cavity size was normal. Systolic function was normal. The estimated ejection fraction was in the range of 55% to 60%. Wall motion was normal; there were no regional wall motion abnormalities. Left ventricular diastolic function parameters were normal. - Aortic valve: Mild regurgitation. - Atrial septum: No defect or patent foramen ovale was identified. - Tricuspid valve: Moderate regurgitation. - Pericardium, extracardiac: A trivial pericardial effusion was identified.  09/28/15 CXR: IMPRESSION: 1. Right upper lobe nodular densities are again noted. 2. Stable cardiomegaly.  09/28/15 Super D Chest CT: IMPRESSION: Stable spiculated opacity with adjacent satellite nodularity in the right upper lobe, hypermetabolic on PET, suspicious for primary bronchogenic neoplasm. Infection/inflammation is considered less likely.  09/28/15 PFTS: FVC 2.10 (91%), FEV11.23 (72%), DLCOunc 11.46 (53%).    Preoperative labs noted. A1c on 09/18/15.  TSH mildly elevated at 5.76 on 08/28/15 (felt to have subclinical hypothyroidism).  If no acute changes then I anticipate that she can proceed as planned.  George Hugh [facility] Short Stay Center/Anesthesiology Phone [phone] 09/28/2015 3:15 PM

## 2015-09-29 ENCOUNTER — Ambulatory Visit (HOSPITAL_COMMUNITY): Payer: Medicare Other | Admitting: Vascular Surgery

## 2015-09-29 ENCOUNTER — Encounter (HOSPITAL_COMMUNITY): Payer: Self-pay | Admitting: *Deleted

## 2015-09-29 ENCOUNTER — Ambulatory Visit (HOSPITAL_COMMUNITY): Payer: Medicare Other | Admitting: Anesthesiology

## 2015-09-29 ENCOUNTER — Ambulatory Visit (HOSPITAL_COMMUNITY)
Admission: RE | Admit: 2015-09-29 | Discharge: 2015-09-29 | Disposition: A | Discharge status: Home / Self Care | Payer: Medicare Other | Source: Ambulatory Visit | Attending: Cardiothoracic Surgery | Admitting: Cardiothoracic Surgery

## 2015-09-29 ENCOUNTER — Encounter (HOSPITAL_COMMUNITY)
Admission: RE | Disposition: A | Discharge status: Home / Self Care | Payer: Self-pay | Source: Ambulatory Visit | Attending: Cardiothoracic Surgery

## 2015-09-29 ENCOUNTER — Ambulatory Visit (HOSPITAL_COMMUNITY): Payer: Medicare Other

## 2015-09-29 DIAGNOSIS — Z79899 Other long term (current) drug therapy: Secondary | ICD-10-CM | POA: Insufficient documentation

## 2015-09-29 DIAGNOSIS — Z419 Encounter for procedure for purposes other than remedying health state, unspecified: Secondary | ICD-10-CM

## 2015-09-29 DIAGNOSIS — R946 Abnormal results of thyroid function studies: Secondary | ICD-10-CM | POA: Diagnosis not present

## 2015-09-29 DIAGNOSIS — R911 Solitary pulmonary nodule: Secondary | ICD-10-CM | POA: Diagnosis not present

## 2015-09-29 DIAGNOSIS — K219 Gastro-esophageal reflux disease without esophagitis: Secondary | ICD-10-CM | POA: Insufficient documentation

## 2015-09-29 DIAGNOSIS — Z9889 Other specified postprocedural states: Secondary | ICD-10-CM

## 2015-09-29 DIAGNOSIS — I10 Essential (primary) hypertension: Secondary | ICD-10-CM | POA: Diagnosis not present

## 2015-09-29 DIAGNOSIS — J8410 Pulmonary fibrosis, unspecified: Secondary | ICD-10-CM | POA: Diagnosis not present

## 2015-09-29 DIAGNOSIS — D649 Anemia, unspecified: Secondary | ICD-10-CM | POA: Diagnosis not present

## 2015-09-29 DIAGNOSIS — R079 Chest pain, unspecified: Secondary | ICD-10-CM | POA: Insufficient documentation

## 2015-09-29 HISTORY — PX: VIDEO BRONCHOSCOPY WITH ENDOBRONCHIAL NAVIGATION: SHX6175

## 2015-09-29 HISTORY — PX: LUNG BIOPSY: SHX5088

## 2015-09-29 HISTORY — PX: FUDUCIAL PLACEMENT: SHX5083

## 2015-09-29 SURGERY — VIDEO BRONCHOSCOPY WITH ENDOBRONCHIAL NAVIGATION
Anesthesia: General | Site: Chest

## 2015-09-29 MED ORDER — PHENYLEPHRINE 40 MCG/ML (10ML) SYRINGE FOR IV PUSH (FOR BLOOD PRESSURE SUPPORT)
PREFILLED_SYRINGE | INTRAVENOUS | Status: AC
  Filled 2015-09-29: qty 10

## 2015-09-29 MED ORDER — ROCURONIUM BROMIDE 100 MG/10ML IV SOLN
INTRAVENOUS | Status: DC | PRN
  Administered 2015-09-29 (×2): 10 mg via INTRAVENOUS
  Administered 2015-09-29: 40 mg via INTRAVENOUS

## 2015-09-29 MED ORDER — OXYCODONE HCL 5 MG/5ML PO SOLN: 5.0000 mg | Freq: Once | ORAL | Status: DC | PRN

## 2015-09-29 MED ORDER — GLYCOPYRROLATE 0.2 MG/ML IJ SOLN
INTRAMUSCULAR | Status: AC
  Filled 2015-09-29: qty 1

## 2015-09-29 MED ORDER — ONDANSETRON HCL 4 MG/2ML IJ SOLN
INTRAMUSCULAR | Status: DC | PRN
  Administered 2015-09-29: 4 mg via INTRAVENOUS

## 2015-09-29 MED ORDER — HYDROMORPHONE HCL 1 MG/ML IJ SOLN: 0.2500 mg | INTRAMUSCULAR | Status: DC | PRN

## 2015-09-29 MED ORDER — EPHEDRINE SULFATE 50 MG/ML IJ SOLN
INTRAMUSCULAR | Status: AC
  Filled 2015-09-29: qty 2

## 2015-09-29 MED ORDER — LIDOCAINE HCL (CARDIAC) 20 MG/ML IV SOLN
INTRAVENOUS | Status: AC
  Filled 2015-09-29: qty 5

## 2015-09-29 MED ORDER — ONDANSETRON HCL 4 MG/2ML IJ SOLN: 4.0000 mg | Freq: Once | INTRAMUSCULAR | Status: DC | PRN

## 2015-09-29 MED ORDER — 0.9 % SODIUM CHLORIDE (POUR BTL) OPTIME
TOPICAL | Status: DC | PRN
  Administered 2015-09-29: 1000 mL

## 2015-09-29 MED ORDER — SUGAMMADEX SODIUM 200 MG/2ML IV SOLN
INTRAVENOUS | Status: AC
  Filled 2015-09-29: qty 2

## 2015-09-29 MED ORDER — SUGAMMADEX SODIUM 200 MG/2ML IV SOLN
INTRAVENOUS | Status: DC | PRN
  Administered 2015-09-29: 150 mg via INTRAVENOUS

## 2015-09-29 MED ORDER — ROCURONIUM BROMIDE 50 MG/5ML IV SOLN
INTRAVENOUS | Status: AC
  Filled 2015-09-29: qty 1

## 2015-09-29 MED ORDER — EPHEDRINE SULFATE 50 MG/ML IJ SOLN
INTRAMUSCULAR | Status: DC | PRN
  Administered 2015-09-29: 10 mg via INTRAVENOUS

## 2015-09-29 MED ORDER — LIDOCAINE HCL (CARDIAC) 20 MG/ML IV SOLN
INTRAVENOUS | Status: DC | PRN
  Administered 2015-09-29: 100 mg via INTRAVENOUS

## 2015-09-29 MED ORDER — PROPOFOL 10 MG/ML IV BOLUS
INTRAVENOUS | Status: DC | PRN
  Administered 2015-09-29: 100 mg via INTRAVENOUS
  Administered 2015-09-29: 20 mg via INTRAVENOUS

## 2015-09-29 MED ORDER — DEXAMETHASONE SODIUM PHOSPHATE 10 MG/ML IJ SOLN
INTRAMUSCULAR | Status: AC
  Filled 2015-09-29: qty 2

## 2015-09-29 MED ORDER — PROPOFOL 10 MG/ML IV BOLUS
INTRAVENOUS | Status: AC
  Filled 2015-09-29: qty 20

## 2015-09-29 MED ORDER — FENTANYL CITRATE (PF) 100 MCG/2ML IJ SOLN
INTRAMUSCULAR | Status: DC | PRN
  Administered 2015-09-29: 100 ug via INTRAVENOUS
  Administered 2015-09-29 (×2): 50 ug via INTRAVENOUS

## 2015-09-29 MED ORDER — ONDANSETRON HCL 4 MG/2ML IJ SOLN
INTRAMUSCULAR | Status: AC
  Filled 2015-09-29: qty 2

## 2015-09-29 MED ORDER — OXYCODONE HCL 5 MG PO TABS: 5.0000 mg | ORAL_TABLET | Freq: Once | ORAL | Status: DC | PRN

## 2015-09-29 MED ORDER — FENTANYL CITRATE (PF) 250 MCG/5ML IJ SOLN
INTRAMUSCULAR | Status: AC
  Filled 2015-09-29: qty 5

## 2015-09-29 MED ORDER — SODIUM CHLORIDE 0.9 % IJ SOLN
INTRAMUSCULAR | Status: AC
  Filled 2015-09-29: qty 20

## 2015-09-29 MED ORDER — LACTATED RINGERS IV SOLN
INTRAVENOUS | Status: DC
  Administered 2015-09-29 (×2): via INTRAVENOUS

## 2015-09-29 SURGICAL SUPPLY — 34 items
BRUSH BIOPSY BRONCH 10 SDTNB (MISCELLANEOUS) ×1 IMPLANT
BRUSH SUPERTRAX BIOPSY (INSTRUMENTS) IMPLANT
BRUSH SUPERTRAX NDL-TIP CYTO (INSTRUMENTS) ×1 IMPLANT
CANISTER SUCTION 2500CC (MISCELLANEOUS) ×2 IMPLANT
CHANNEL WORK EXTEND EDGE 180 (KITS) IMPLANT
CHANNEL WORK EXTEND EDGE 45 (KITS) IMPLANT
CHANNEL WORK EXTEND EDGE 90 (KITS) IMPLANT
CONT SPEC 4OZ CLIKSEAL STRL BL (MISCELLANEOUS) ×4 IMPLANT
COVER TABLE BACK 60X90 (DRAPES) ×2 IMPLANT
DRSG AQUACEL AG ADV 3.5X14 (GAUZE/BANDAGES/DRESSINGS) ×2 IMPLANT
FILTER STRAW FLUID ASPIR (MISCELLANEOUS) IMPLANT
FORCEPS BIOP SUPERTRX PREMAR (INSTRUMENTS) ×1 IMPLANT
GAUZE SPONGE 4X4 12PLY STRL (GAUZE/BANDAGES/DRESSINGS) ×2 IMPLANT
GLOVE BIO SURGEON STRL SZ 6.5 (GLOVE) ×2 IMPLANT
KIT CLEAN ENDO COMPLIANCE (KITS) ×2 IMPLANT
KIT MARKER FIDUCIAL DELIVERY (KITS) ×1 IMPLANT
KIT PROCEDURE EDGE 180 (KITS) ×1 IMPLANT
KIT PROCEDURE EDGE 45 (KITS) IMPLANT
KIT PROCEDURE EDGE 90 (KITS) IMPLANT
KIT ROOM TURNOVER OR (KITS) ×2 IMPLANT
MARKER FIDUCIAL SL NIT COIL (Implant Marker) ×3 IMPLANT
MARKER SKIN DUAL TIP RULER LAB (MISCELLANEOUS) ×2 IMPLANT
NDL SUPERTRX PREMARK BIOPSY (NEEDLE) IMPLANT
NEEDLE SUPERTRX PREMARK BIOPSY (NEEDLE) IMPLANT
NS IRRIG 1000ML POUR BTL (IV SOLUTION) ×2 IMPLANT
OIL SILICONE PENTAX (PARTS (SERVICE/REPAIRS)) ×2 IMPLANT
PAD ARMBOARD 7.5X6 YLW CONV (MISCELLANEOUS) ×4 IMPLANT
PATCHES PATIENT (LABEL) ×6 IMPLANT
SYR 20CC LL (SYRINGE) IMPLANT
SYR 20ML ECCENTRIC (SYRINGE) ×2 IMPLANT
TOWEL OR 17X24 6PK STRL BLUE (TOWEL DISPOSABLE) ×2 IMPLANT
TRAP SPECIMEN MUCOUS 40CC (MISCELLANEOUS) ×2 IMPLANT
TUBE CONNECTING 20X1/4 (TUBING) ×2 IMPLANT
UNDERPAD 30X30 (UNDERPADS AND DIAPERS) ×2 IMPLANT

## 2015-09-29 NOTE — H&P (Signed)
Cynthia Vargas       [place] [address]             [phone]                    Cynthia Vargas [place] Medical Record #[id number] Date of Birth: 1932-02-12  Referring: No ref. provider found Primary Care: Cynthia Koch, Vargas  Chief Complaint:    No chief complaint on file.   History of Present Illness:    Cynthia Vargas 82 y.o. female is seen in the office  today for At the request of Cynthia Vargas because of hypermetabolic lesions in the right upper lobe. The patient is a lifelong nonsmoker. She has bothered by several years of exertional chest pain, and significant shortness of breath with exertion. She notes that she walks her dog but becomes very short of breath and has to stop. She denies hemoptysis. She had a cardiac CT of the chest, which was moderate risk for coronary disease by calcium score but also incidentally noted to right upper lobe lung lesions. Subsequent PET scan has been obtained and shows a  Highly spiculated 2.8 x 1.5 cm right upper lobe nodule with adjacent satellite nodules, highly concerning for potential primary bronchogenic carcinoma. Unfortunately the CT scan has been deleted, so the raw data is not available for navigation bronchoscopy.    Current Activity/ Functional Status:  Patient will not be independent with mobility/ambulation, transfers, ADL's, IADL's.   Zubrod Score: At the time of surgery this patient's most appropriate activity status/level should be described as: '[]'$     0    Normal activity, no symptoms '[]'$     1    Restricted in physical strenuous activity but ambulatory, able to do out light work '[x]'$     2    Ambulatory and capable of self care, unable to do work activities, up and about               >50 % of waking hours                              '[]'$     3    Only limited self care, in bed greater than 50% of waking hours '[]'$     4    Completely disabled, no self care, confined to bed or chair '[]'$     5     Moribund   Past Medical History  Diagnosis Date  . CALCANEAL FRACTURE, RIGHT 11/04/2007  . DILATION AND CURETTAGE, HX OF 11/04/2007  . KNEE PAIN, RIGHT, CHRONIC 03/19/2010  . OSTEOPOROSIS 05/02/2007  . PEPTIC ULCER DISEASE 05/02/2007  . Personal History of Other Diseases of Digestive Disease 11/04/2007  . Unspecified essential hypertension 05/02/2007  . GERD (gastroesophageal reflux disease)     Past Surgical History  Procedure Laterality Date  . Dilation and curettage of uterus    . Cholecystectomy    . Tonsillectomy    . Calcaneal fracture, right    . Orif hip fracture    . Esophagogastroduodenoscopy N/A 03/30/2013    Procedure: ESOPHAGOGASTRODUODENOSCOPY (EGD);  Surgeon: Cynthia Bears, Vargas;  Location: [facility];  Service: Endoscopy;  Laterality: N/A;    Family History  Problem Relation Age of Onset  . Cancer Father   . Heart disease Mother   . Diabetes Neg Hx   . Hyperlipidemia Neg Hx   . Hypertension Neg  Hx   patients grandmother died of TB, Patients mother was known to have TB  Social History   Social History  . Marital Status: Single    Spouse Name: N/A  . Number of Children: 1  . Years of Education: 16   Occupational History  . reitred    Social History Main Topics  . Smoking status: Never Smoker   . Smokeless tobacco: Never Used  . Alcohol Use: 0.6 oz/week    1 Glasses of wine per week  . Drug Use: No  . Sexual Activity: No   Other Topics Concern  . Not on file   Social History Narrative   [employer] degrees in Development worker, community. married for 2 years then single . 1 son - Cynthia Vargas. 2 daughters-put up for adoption. Lives alone and is independent in ADL's. works at [employer] until injured.    History  Smoking status  . Never Smoker   Smokeless tobacco  . Never Used    History  Alcohol Use  . 0.6 oz/week  . 1 Glasses of wine per week     Allergies  Allergen Reactions  . Peanut-Containing Drug Products Other (See Comments)    angioedema     Current Facility-Administered Medications  Medication Dose Route Frequency Provider Last Rate Last Dose  . lactated ringers infusion   Intravenous Continuous Cynthia Vargas          Review of Systems:     Cardiac Review of Systems: Y or N  Chest Pain [  y  ]  Resting SOB [ y  ] Exertional SOB  Blue.Reese  ]  Orthopnea [ n ]   Pedal Edema Blue.Reese   ]    Palpitations Cynthia Vargas  ] Syncope  [ n ]   Presyncope [  n ]  General Review of Systems: [Y] = yes [  ]=no Constitional: recent weight change [  ];  Wt loss over the last 3 months [   ] anorexia [  ]; fatigue [ y ]; nausea [  ]; night sweats [  ]; fever [  ]; or chills [  ];          Dental: poor dentition[  ]; Last Dentist visit:   Eye : blurred vision [  ]; diplopia [   ]; vision changes [  ];  Amaurosis fugax[  ]; Resp: cough Blue.Reese  ];  wheezing[y];  hemoptysis[n  ]; shortness of breath[y  ]; paroxysmal nocturnal dyspnea[ y ]; dyspnea on exertion[y  ]; or orthopnea[  ];  GI:  gallstones[  ], vomiting[  ];  dysphagia[  ]; melena[  ];  hematochezia [  ]; heartburn[  ];   Hx of  Colonoscopy[  ]; GU: kidney stones [  ]; hematuria[  ];   dysuria [  ];  nocturia[  ];  history of     obstruction [  ]; urinary frequency [  ]             Skin: rash, swelling[  ];, hair loss[  ];  peripheral edema[  ];  or itching[  ]; Musculosketetal: myalgias[  ];  joint swelling[  ];  joint erythema[  ];  joint pain[  ];  back pain[  ];  Heme/Lymph: bruising[  ];  bleeding[  ];  anemia[  ];  Neuro: TIA[n  ];  headaches[ n ];  stroke[ n ];  vertigo[  ];  seizures[ n ];   paresthesias[  ];  difficulty walking[ yes with cane ];  Psych:depression[  ]; anxiety[  ];  Endocrine: diabetes[  ];  thyroid dysfunction[  ];  Immunizations: Flu up to date [  ]; Pneumococcal up to date [  ];  Other:  Physical Exam: BP 165/86 mmHg  Pulse 70  Temp(Src) 98.4 F (36.9 C) (Oral)  Resp 16  Ht '5\' 2"'$  (1.575 m)  Wt 107 lb 12.9 oz (48.901 kg)  BMI 19.71 kg/m2  SpO2 100%  PHYSICAL  EXAMINATION: General appearance: alert, cooperative, appears older than stated age and fatigued Head: Normocephalic, without obvious abnormality, atraumatic Neck: no adenopathy, no carotid bruit, no JVD, supple, symmetrical, trachea midline and thyroid not enlarged, symmetric, no tenderness/mass/nodules Lymph nodes: Cervical, supraclavicular, and axillary nodes normal. Resp: clear to auscultation bilaterally Back: symmetric, no curvature. ROM normal. No CVA tenderness. Cardio: regular rate and rhythm, S1, S2 normal, no murmur, click, rub or gallop GI: soft, non-tender; bowel sounds normal; no masses,  no organomegaly Extremities: extremities normal, atraumatic, no cyanosis or edema and Homans sign is negative, no sign of DVT Neurologic: Grossly normal Patient has poor gait difficulty standing from a sitting position and difficulty walking around in the office, is very slow and uses a walker  Diagnostic Studies & Laboratory data:     Recent Radiology Findings:  Dg Chest 2 View  09/28/2015  CLINICAL DATA:  Right lung nodule. EXAM: CHEST  2 VIEW COMPARISON:  PET-CT 09/25/2015. FINDINGS: Mediastinum hilar structures normal. Nodular densities in right upper lung again noted. Stable cardiomegaly. No pleural effusion or pneumothorax. IMPRESSION: 1.  Right upper lobe nodular densities are again noted. 2. Stable cardiomegaly. Electronically Signed   By: Marcello Moores  Register   On: 09/28/2015 10:09    Ct Super D Chest Wo Contrast  09/28/2015  CLINICAL DATA:  Pulmonary nodule EXAM: CT CHEST WITHOUT CONTRAST TECHNIQUE: Multidetector CT imaging of the chest was performed using thin slice collimation for electromagnetic bronchoscopy planning purposes, without intravenous contrast. COMPARISON:  PET-CT dated 09/25/2015. Coronary CT chest dated 09/14/2015. FINDINGS: Mediastinum/Lymph Nodes: The heart is normal in size. Trace pericardial effusion. Mild coronary atherosclerosis in the LAD. Atherosclerotic  calcifications aortic arch. No suspicious mediastinal lymphadenopathy. Visualized thyroid is unremarkable. Lungs/Pleura: Stable nodularity in the lateral right upper lobe left lobe, grossly unchanged from recent PET and CT. Dominant spiculated nodule measures approximately 2.4 x 1.3 cm (series 5/ image 24). Additional subpleural nodule measures 9 mm. An additional tubular opacity is present along the inferior aspect of the lesion (series 5/ image 25). The overall lesion is hypermetabolic on prior PET and is considered suspicious for primary bronchogenic neoplasm, less likely infection/inflammation. Lungs are otherwise clear.  No focal consolidation. Mild biapical pleural parenchymal scarring. No pleural effusion or pneumothorax. Upper abdomen: Visualized upper abdomen is notable for cholecystectomy clips and vascular calcifications. Musculoskeletal: Degenerative changes of the thoracic spine. IMPRESSION: Stable spiculated opacity with adjacent satellite nodularity in the right upper lobe, hypermetabolic on PET, suspicious for primary bronchogenic neoplasm. Infection/inflammation is considered less likely. Electronically Signed   By: Julian Hy M.D.   On: 09/28/2015 13:44    Nm Pet Image Initial (pi) Skull Base To Thigh  09/25/2015  CLINICAL DATA:  Initial treatment strategy for lung nodule. EXAM: NUCLEAR MEDICINE PET SKULL BASE TO THIGH TECHNIQUE: 5.4 mCi F-18 FDG was injected intravenously. Full-ring PET imaging was performed from the skull base to thigh after the radiotracer. CT data was obtained and used for attenuation correction and anatomic localization. FASTING BLOOD GLUCOSE:  Value: 78 mg/dl COMPARISON:  Coronary CTA 09/14/2015. FINDINGS: NECK No hypermetabolic lymph nodes in the neck. CT images show no acute findings. CHEST Dominant spiculated nodular lesion in the perihilar right upper lobe measures 1.0 x 2.3 cm, stable when remeasured, with an SUV max of 9.7. Adjacent nodule (CT image 30, series  6) is also hypermetabolic. No hypermetabolic mediastinal, hilar or axillary lymph nodes. No additional hypermetabolic pulmonary nodules. CT images show coronary artery calcification. Small amount of pericardial fluid is likely physiologic. No pleural effusion. ABDOMEN/PELVIS No abnormal hypermetabolism in the liver, adrenal glands, spleen or pancreas. No hypermetabolic lymph nodes. CT images show the liver and adrenal glands to be grossly unremarkable. Cholecystectomy. Scarring in the right kidney. Kidneys, spleen, pancreas, stomach and bowel are grossly unremarkable. SKELETON No abnormal osseous hypermetabolism. IMPRESSION: 1. Hypermetabolic nodularity in the right upper lobe, worrisome for primary bronchogenic carcinoma. An infectious process is not excluded but is considered less likely. 2. Coronary artery calcification, recently evaluated on 09/14/2015. Electronically Signed   By: Lorin Picket M.D.   On: 09/25/2015 13:09   Ct Coronary Morp W/cta Cor W/score W/ca W/cm &/or Wo/cm  09/14/2015  ADDENDUM REPORT: 09/14/2015 23:17 CLINICAL DATA:  Chest pain EXAM: Cardiac CTA MEDICATIONS: Sub lingual nitro. '4mg'$  and lopressor '5mg'$  IV x 1 TECHNIQUE: The patient was scanned on a Philips 885 slice scanner. Gantry rotation speed was 270 msecs. Collimation was .14m. A 100 kV prospective scan was triggered in the descending thoracic aorta at 111 HU's with 5% padding centered around 78% of the R-R interval. Average HR during the scan was 65 bpm. The 3D data set was interpreted on a dedicated work station using MPR, MIP and VRT modes. A total of 80cc of contrast was used. FINDINGS: [facility]cardiac: [facility] Calcium Score:  101 Agatston units. Coronary Arteries: Right dominant with no anomalies LM: There was calcified plaque at the left main ostium with no more than mild stenosis. LAD system: Mixed plaque in the proximal LAD with mild stenosis. No significant disease in the remainder of the  LAD. Circumflex system: No plaque or stenosis. Smalll OM1, moderate OM2, and large PLOM noted. RCA: Motion artifact affects the distal RCA, but the vessel is interpretable. No plaque or stenosis noted. IMPRESSION: 1.  Nonobstructive coronary disease. 2. Coronary artery calcium score of 101 Agatston units, placing the patient in the 54th percentile for age and gender. This suggests intermediate risk of future cardiac events. Dalton Mclean Electronically Signed   By: DLoralie ChampagneM.D.   On: 09/14/2015 23:17  09/14/2015  EXAM: OVER-READ INTERPRETATION  CT CHEST The following report is an over-read performed by radiologist Dr. DRebekah ChesterfieldGVa [facility] on 09/14/2015. This over-read does not include interpretation of cardiac or coronary anatomy or pathology. The coronary calcium score/coronary CTA interpretation by the cardiologist is attached. COMPARISON:  No priors. FINDINGS: In the inferior aspect of the right upper lobe there is a 2.8 x 1.5 cm highly spiculated nodule (image 11 of series 204) which is highly concerning for primary bronchogenic carcinoma. There are satellite nodules emanating outward from this lesion in the right upper lobe, the largest of which measures up to 10 mm. Small 3 mm nodule associated with the minor fissure is nonspecific and may simply represent a subpleural lymph node. A few other scattered tiny 1-2 mm pulmonary nodules are noted throughout the lungs bilaterally, typically in the periphery, likely to represent areas of mucoid impaction within terminal bronchioles. No consolidative airspace  disease. No pleural effusions. Within the visualized portions of the thorax there is no definite mediastinal or hilar lymphadenopathy. Visualized portions of the upper abdomen are unremarkable. There are no aggressive appearing lytic or blastic lesions noted in the visualized portions of the skeleton. IMPRESSION: 1. Highly spiculated 2.8 x 1.5 cm right upper lobe nodule with adjacent  satellite nodules, highly concerning for potential primary bronchogenic carcinoma. Further evaluation with PET-CT and/or biopsy is strongly recommended in the near future for diagnostic and staging purposes. These results will be called to the ordering clinician or representative by the Radiologist Assistant, and communication documented in the PACS or zVision Dashboard. Electronically Signed: By: Vinnie Langton M.D. On: 09/14/2015 13:07   I have independently reviewed the above radiology studies  and reviewed the findings with the patient.   Recent Lab Findings: Lab Results  Component Value Date   WBC 3.8* 09/28/2015   HGB 13.0 09/28/2015   HCT 41.1 09/28/2015   PLT 231 09/28/2015   GLUCOSE 92 09/28/2015   CHOL 200 09/18/2015   TRIG 47 09/18/2015   HDL 88 09/18/2015   LDLDIRECT 115.1 03/19/2010   LDLCALC 103 09/18/2015   ALT 16 09/28/2015   AST 29 09/28/2015   NA 141 09/28/2015   K 3.6 09/28/2015   CL 105 09/28/2015   CREATININE 0.82 09/28/2015   BUN 12 09/28/2015   CO2 28 09/28/2015   TSH 5.76* 08/28/2015   INR 1.08 09/28/2015   HGBA1C 5.7* 09/18/2015   PFT's: FEV1 1.23 72 % with bronchodilators +29% DLCO 11.46 53% The reduced diffusing capacity indicates a moderately severe loss of functional alveolar capillary surface. Maldistribution of ventilation is indicated by the difference between the alveolar volume and the total lung capacity. Conclusions: A reduced diffusing capacity, minimal airway obstruction and overinflation are characteristic of emphysema. The response to bronchodilators indicates a reversible component. In view of the severity of the diffusion defect, studies with exercise would be helpful to evaluate the presence of hypoxemia. Pulmonary Function Diagnosis: Minimal Obstructive Airways Disease -Emphysematous Type, Reversible Component  Assessment / Plan:   Clinical stage II B carcinoma of the lung, T3 lesion with 2 nodules in the same lobe,  I discussed  with the patient the probable diagnosis, however all fragility noted in the office today would make her a poor candidate at age 87 for lobectomy. I recommended that we evaluate her pulmonary function studies, and received with a navigation bronchoscopy ENB and tried to obtain a tissue diagnosis of at least one of the lesions.  Following obtaining a tissue diagnosis we'll have her seen in the multidisciplinary thoracic oncology clinic And review treatment options.   The goals risks and alternatives of the planned surgical procedure bronchoscopy with navigation ENB and biopsy    have been discussed with the patient in detail. The risks of the procedure including pneumothorax , death, infection, stroke, myocardial infarction, bleeding, blood transfusion have all been discussed specifically.  I have quoted [name] a 1% of perioperative mortality and a complication rate as high as 10%. The patient's questions have been answered.Cynthia Vargas is willing  to proceed with the planned procedure. Grace Isaac Vargas      [address] [place] [address] Office [phone]   Beeper [phone]  09/29/2015 7:24 AM

## 2015-09-29 NOTE — Brief Op Note (Addendum)
      [address]       [place] [address]             [phone]      09/29/2015  9:49 AM  PATIENT:  Cynthia Vargas  82 y.o. female  PRE-OPERATIVE DIAGNOSIS:  PULMONARY NODULE  POST-OPERATIVE DIAGNOSIS:  pulmonary nodule, right upper lobe, quick smear negative further tissue for results pending   PROCEDURE:  Procedure(s): VIDEO BRONCHOSCOPY WITH ENDOBRONCHIAL NAVIGATION (N/A) LUNG BIOPSY (N/A)  PLACEMENT OF FUDUCIAL Marker times three (N/A)  SURGEON:  Surgeon(s) and Role:    * Grace Isaac, MD - Primary    ANESTHESIA:   general  EBL:  Total I/O In: 1000 [I.V.:1000] Out: -   BLOOD ADMINISTERED:none  DRAINS: none   LOCAL MEDICATIONS USED:  NONE  SPECIMEN:  Source of Specimen:  right upper lobe  DISPOSITION OF SPECIMEN:  PATHOLOGY  COUNTS:  YES   DICTATION: .Dragon Dictation  PLAN OF CARE: Discharge to home after PACU  PATIENT DISPOSITION:  PACU - hemodynamically stable.   Delay start of Pharmacological VTE agent (>24hrs) due to surgical blood loss or risk of bleeding: yes

## 2015-09-29 NOTE — Anesthesia Preprocedure Evaluation (Addendum)
Anesthesia Evaluation  Patient identified by MRN, date of birth, ID band Patient awake    Reviewed: Allergy & Precautions, H&P , NPO status , Patient's Chart, lab work & pertinent test results  Airway Mallampati: II  TM Distance: >3 FB     Dental  (+) Teeth Intact, Dental Advidsory Given   Pulmonary    breath sounds clear to auscultation       Cardiovascular hypertension, On Medications  Rhythm:regular     Neuro/Psych    GI/Hepatic PUD, GERD  Medicated and Controlled,  Endo/Other    Renal/GU      Musculoskeletal   Abdominal   Peds  Hematology  (+) anemia ,   Anesthesia Other Findings   Reproductive/Obstetrics                          Anesthesia Physical Anesthesia Plan  ASA: III  Anesthesia Plan: General   Post-op Pain Management:    Induction: Intravenous  Airway Management Planned: Oral ETT  Additional Equipment:   Intra-op Plan:   Post-operative Plan: Extubation in OR  Informed Consent: I have reviewed the patients History and Physical, chart, labs and discussed the procedure including the risks, benefits and alternatives for the proposed anesthesia with the patient or authorized representative who has indicated his/her understanding and acceptance.   Dental Advisory Given and Dental advisory given  Plan Discussed with: Anesthesiologist, CRNA and Surgeon  Anesthesia Plan Comments:       Anesthesia Quick Evaluation

## 2015-09-29 NOTE — Anesthesia Procedure Notes (Signed)
Procedure Name: Intubation Date/Time: 09/29/2015 7:39 AM Performed by: Neldon Newport Pre-anesthesia Checklist: Patient being monitored, Suction available, Emergency Drugs available, Patient identified and Timeout performed Patient Re-evaluated:Patient Re-evaluated prior to inductionOxygen Delivery Method: Circle system utilized Preoxygenation: Pre-oxygenation with 100% oxygen Intubation Type: IV induction Ventilation: Mask ventilation without difficulty Laryngoscope Size: Mac, 3 and Glidescope Grade View: Grade II Tube type: Oral Tube size: 8.5 mm Number of attempts: 3 Placement Confirmation: positive ETCO2,  ETT inserted through vocal cords under direct vision and breath sounds checked- equal and bilateral Secured at: 22 cm Tube secured with: Tape Dental Injury: Teeth and Oropharynx as per pre-operative assessment  Difficulty Due To: Difficulty was unanticipated

## 2015-09-29 NOTE — Transfer of Care (Signed)
Immediate Anesthesia Transfer of Care Note  Patient: Cynthia Vargas  Procedure(s) Performed: Procedure(s): VIDEO BRONCHOSCOPY WITH ENDOBRONCHIAL NAVIGATION (N/A) LUNG BIOPSY (N/A)  PLACEMENT OF FUDUCIAL Marker times three (N/A)  Patient Location: PACU  Anesthesia Type:General  Level of Consciousness: awake, alert  and oriented  Airway & Oxygen Therapy: Patient Spontanous Breathing and Patient connected to face mask oxygen  Post-op Assessment: Report given to RN, Post -op Vital signs reviewed and stable and Patient moving all extremities X 4  Post vital signs: Reviewed and stable  Last Vitals:  Filed Vitals:   09/29/15 0600  BP: 165/86  Pulse: 70  Temp: 36.9 C  Resp: 16    Complications: No apparent anesthesia complications

## 2015-09-29 NOTE — Discharge Instructions (Signed)
Flexible Bronchoscopy, Care After Refer to this sheet in the next few weeks. These instructions provide you with information on caring for yourself after your procedure. Your health care provider may also give you more specific instructions. Your treatment has been planned according to current medical practices, but problems sometimes occur. Call your health care provider if you have any problems or questions after your procedure.  WHAT TO EXPECT AFTER THE PROCEDURE It is normal to have the following symptoms for 24-48 hours after the procedure:   Increased cough.  Low-grade fever.  Sore throat or hoarse voice.  Small streaks of blood in your thick spit (sputum) if tissue samples were taken (biopsy). HOME CARE INSTRUCTIONS   Do not eat or drink anything for 2 hours after your procedure. Your nose and throat were numbed by medicine. If you try to eat or drink before the medicine wears off, food or drink could go into your lungs or you could burn yourself. After the numbness is gone and your cough and gag reflexes have returned, you may eat soft food and drink liquids slowly.   The day after the procedure, you can go back to your normal diet.   You may resume normal activities.   Keep all follow-up visits as directed by your health care provider. It is important to keep all your appointments, especially if tissue samples were taken for testing (biopsy). SEEK IMMEDIATE MEDICAL CARE IF:   You have increasing shortness of breath.   You become light-headed or faint.   You have chest pain.   You have any new concerning symptoms.  You cough up more than a small amount of blood.  The amount of blood you cough up increases. MAKE SURE YOU:  Understand these instructions.  Will watch your condition.  Will get help right away if you are not doing well or get worse.   This information is not intended to replace advice given to you by your health care provider. Make sure you discuss  any questions you have with your health care provider.   Document Released: 01/04/2005 Document Revised: 07/08/2014 Document Reviewed: 02/19/2013 Elsevier Interactive Patient Education [employer].

## 2015-09-29 NOTE — Anesthesia Postprocedure Evaluation (Signed)
Anesthesia Post Note  Patient: Cynthia Vargas  Procedure(s) Performed: Procedure(s) (LRB): VIDEO BRONCHOSCOPY WITH ENDOBRONCHIAL NAVIGATION (N/A) LUNG BIOPSY (N/A)  PLACEMENT OF FUDUCIAL Marker times three (N/A)  Patient location during evaluation: PACU Anesthesia Type: General Level of consciousness: awake, awake and alert and oriented Pain management: pain level controlled Vital Signs Assessment: post-procedure vital signs reviewed and stable Respiratory status: spontaneous breathing, nonlabored ventilation and respiratory function stable Cardiovascular status: blood pressure returned to baseline Anesthetic complications: no    Last Vitals:  Filed Vitals:   09/29/15 1128 09/29/15 1136  BP:  149/88  Pulse: 78 77  Temp:    Resp: 15 16    Last Pain:  Filed Vitals:   09/29/15 1138  PainSc: 0-No pain                 Tanayah Squitieri COKER

## 2015-10-02 ENCOUNTER — Encounter (HOSPITAL_COMMUNITY): Payer: Self-pay | Admitting: Cardiothoracic Surgery

## 2015-10-03 ENCOUNTER — Telehealth: Payer: Self-pay | Admitting: *Deleted

## 2015-10-03 NOTE — Op Note (Signed)
NAMENAYLEAH, GAMEL NO.:  [id number]  MEDICAL RECORD NO.:  [id number]  LOCATION:  [facility]                         FACILITY:  [facility]  PHYSICIAN:  Lanelle Bal, MD    DATE OF BIRTH:  Jan 10, 1932  DATE OF PROCEDURE:  09/29/2015 DATE OF DISCHARGE:  09/29/2015                              OPERATIVE REPORT   PREOPERATIVE DIAGNOSIS:  Right upper lobe lung mass.  POSTOPERATIVE DIAGNOSIS:  Right upper lobe lung mass.  Final path pending.  PROCEDURE:  Video bronchoscopy with electromagnetic navigation bronchoscopy with biopsy.  SURGEON:  Lanelle Bal, MD.  BRIEF HISTORY:  The patient is an 82 year old female, who was found on CT of the heart to evaluate for episodic chest pain, was found incidentally to have a right upper lobe lung mass with probable satellite lesion suggestive of carcinoma of the lung.  PET scan showed no evidence of distant disease.  The patient is somewhat frail and not an ideal candidate for lobectomy.  Initially proceeding with bronchoscopy with navigation and biopsy was recommended to the patient. She agreed and signed informed consent.  A repeat CT scan was necessary because of the raw data was deleted from the cardiac CT.  An appropriate navigation plan was developed and loaded into the Super D equipment. The risks and options of surgery were discussed with the patient in detail.  She was agreeable and signed informed consent.  DESCRIPTION OF PROCEDURE:  The patient underwent general endotracheal anesthesia without incident.  An appropriate time-out was performed.  We then proceeded with a standard video bronchoscopy to the subsegmental level, both in the right and left tracheobronchial tree.  There were no endobronchial lesions noted.  Using the navigation bronchoscopy equipment, the bronchoscope and center were registered appropriately and then the probe and working channel were navigated to the right upper lobe lung lesion with  satisfactory results both fluoroscopically and with the Super D equipment.  We then proceeded with the needle brush biopsy under fluoroscopy.  Multiple passes were sent for cytology.  A triple brush was also used in the same area checking intermittently with the sensor probe to confirm our position.  Multiple biopsies with a small biopsy forceps were also obtained and submitted for permanent marking.  While pathology was examining the initial slides, we navigated through a separate pathway to the same lesion and again multiple biopsies of the right upper lobe lesion were done including needle brush and biopsies.  The fluoroscopic appearance of the biopsy position confirmed and was consistent with the Super D findings.  The initial quick smear was negative.  However, because of the high suspicion of malignancy, we proceeded with fiducial marker placement in case the patient ultimately needs radiation treatment.  The Super D equipment map location after acquiring the data 3 fiducial markers, suggested positions were developed and navigated, 2 and 3 fiducial markers were placed without difficulty.  We then removed the scopes.  The patient was extubated in the operating room having tolerated the procedure without obvious complication.  We will await the final pathology results before formalizing final treatment plan.  The patient tolerated the procedure without obvious complication with no blood loss.     Lanelle Bal,  MD     EG/MEDQ  D:  10/02/2015  T:  10/03/2015  Job:  [id number]

## 2015-10-03 NOTE — Telephone Encounter (Signed)
Received request from [name] that [name] requested for me to call the pt w/ a Cynthia appt.  Called pt and confirmed 10/05/15 clinic appt.  Gave directions, instructions and contact information to pt.

## 2015-10-05 ENCOUNTER — Ambulatory Visit (INDEPENDENT_AMBULATORY_CARE_PROVIDER_SITE_OTHER): Payer: Medicare Other | Admitting: Cardiothoracic Surgery

## 2015-10-05 ENCOUNTER — Telehealth: Payer: Self-pay | Admitting: Cardiology

## 2015-10-05 ENCOUNTER — Encounter: Payer: Self-pay | Admitting: Cardiothoracic Surgery

## 2015-10-05 ENCOUNTER — Ambulatory Visit
Admission: RE | Admit: 2015-10-05 | Discharge: 2015-10-05 | Disposition: A | Discharge status: Home / Self Care | Payer: Medicare Other | Source: Ambulatory Visit | Attending: Radiation Oncology | Admitting: Radiation Oncology

## 2015-10-05 ENCOUNTER — Ambulatory Visit: Payer: Medicare Other | Admitting: Physical Therapy

## 2015-10-05 ENCOUNTER — Other Ambulatory Visit: Payer: Self-pay | Admitting: *Deleted

## 2015-10-05 VITALS — BP 152/89 | HR 75 | Resp 16 | Ht 62.0 in | Wt 107.0 lb

## 2015-10-05 DIAGNOSIS — Z9889 Other specified postprocedural states: Secondary | ICD-10-CM

## 2015-10-05 DIAGNOSIS — I2510 Atherosclerotic heart disease of native coronary artery without angina pectoris: Secondary | ICD-10-CM

## 2015-10-05 DIAGNOSIS — I2584 Coronary atherosclerosis due to calcified coronary lesion: Secondary | ICD-10-CM

## 2015-10-05 DIAGNOSIS — R918 Other nonspecific abnormal finding of lung field: Secondary | ICD-10-CM

## 2015-10-05 DIAGNOSIS — R911 Solitary pulmonary nodule: Secondary | ICD-10-CM | POA: Diagnosis not present

## 2015-10-05 DIAGNOSIS — D381 Neoplasm of uncertain behavior of trachea, bronchus and lung: Secondary | ICD-10-CM | POA: Diagnosis not present

## 2015-10-05 NOTE — Telephone Encounter (Signed)
New message  Pt called req a call back to discuss BP.  Pt c/o BP issue:  1. What are your last 5 BP readings? No readings  2. Are you having any other symptoms (ex. Dizziness, headache, blurred vision, passed out)? Heart pain and Dizziness   Pt c/o of Chest Pain:  1. Are you having CP right now? No But her BP is High today ( No readings)  2. Are you experiencing any other symptoms (ex. SOB, nausea, vomiting, sweating)? Nausea on Sunday  3. How long have you been experiencing CP? No major attack since Sunday  4. Is your CP continuous or coming and going? Just that one time 5. Have you taken Nitroglycerin? Yes she had yo take several at the time to get herself going.

## 2015-10-05 NOTE — Progress Notes (Signed)
   Department of Radiation Oncology  Phone:  [phone] Fax:        [phone]   82 yo woman with right upper lobe nodules.  3/16  Chest CT    3/27 PET was positive  ENB was negative  CT-biopsy to be performed.  Will follow-up after to discuss possible SBRT.  ------------------------------------------------   Tyler Pita, MD [place] Director and Director of Stereotactic Radiosurgery Direct Dial: [phone]  Fax: [phone] [email or web address]  Skype  LinkedIn

## 2015-10-05 NOTE — Telephone Encounter (Signed)
Patient st she had "extreme chest pain" on Sunday morning. The pain woke her up at 0330. She was extremely nauseous and threw up at that time. Over the course of 4 hours, she took 4 SL NTG and eventually the pain "calmed down."  She reports she has had no further chest pain since that episode. She saw Dr. Servando Snare today and he instructed her to call our office for further recommendations.  At [place] today, BP was 152/89. Scheduled patient tomorrow, 4/7 for evaluation with Richardson Dopp.  Patient understands to seek immediate medical attention if symptoms return prior to appointment.

## 2015-10-05 NOTE — Progress Notes (Signed)
[address]       [place] [address]             [phone]                    Meta J [place] [facility] Medical Record #[id number] Date of Birth: 1931-11-13  Referring: Sueanne Margarita, MD Primary Care: Hoyt Koch, MD  Chief Complaint:    Chief Complaint  Patient presents with  . Routine Post Op    f/u from surgery s/p ENB with biopsy 09/29/15, discuss path results    History of Present Illness:    Cynthia Vargas 82 y.o. female is seen in the office  today for At the request of Dr. Radford Pax because of hypermetabolic lesions in the right upper lobe. The patient is a lifelong nonsmoker. She has bothered by several years of exertional chest pain, and significant shortness of breath with exertion. She notes that she walks her dog but becomes very short of breath and has to stop. She denies hemoptysis. She had a cardiac CT of the chest, which was moderate risk for coronary disease by calcium score but also incidentally noted to right upper lobe lung lesions. Subsequent PET scan has been obtained and shows a  Highly spiculated 2.8 x 1.5 cm right upper lobe nodule with adjacent satellite nodules, highly concerning for potential primary bronchogenic carcinoma. Bronchoscopy with navigation to the right upper lobe lung lesion with biopsy was done last week.  Lung, biopsy, Right upper lobe lung  - MILD INFLAMMATION AND FOCAL FIBROSIS. - NO GRANULOMATOUS OR MALIGNANCY. - SEE DESCRIPTION. Microscopic Comment There is patchy, mild predominantly chronic inflammation with a small focus of associated fibrosis. No granulomas or malignancy is identified. (JDP:ds 10/02/15) Claudette Laws MD  Cytology also negative  Patient's case including films and pathology were reviewed in the multi-disc married thoracic oncology conference this morning. Films were reviewed with Dr. Reesa Chew interventional radiology for consideration of CT-guided needle biopsy.  Current Activity/  Functional Status:  Patient will not be independent with mobility/ambulation, transfers, ADL's, IADL's.   Zubrod Score: At the time of surgery this patient's most appropriate activity status/level should be described as: '[]'$     0    Normal activity, no symptoms '[]'$     1    Restricted in physical strenuous activity but ambulatory, able to do out light work '[x]'$     2    Ambulatory and capable of self care, unable to do work activities, up and about               >50 % of waking hours                              '[]'$     3    Only limited self care, in bed greater than 50% of waking hours '[]'$     4    Completely disabled, no self care, confined to bed or chair '[]'$     5    Moribund   Past Medical History  Diagnosis Date  . CALCANEAL FRACTURE, RIGHT 11/04/2007  . DILATION AND CURETTAGE, HX OF 11/04/2007  . KNEE PAIN, RIGHT, CHRONIC 03/19/2010  . OSTEOPOROSIS 05/02/2007  . PEPTIC ULCER DISEASE 05/02/2007  . Personal History of Other Diseases of Digestive Disease 11/04/2007  . Unspecified essential hypertension 05/02/2007  . GERD (gastroesophageal reflux disease)     Past Surgical  History  Procedure Laterality Date  . Dilation and curettage of uterus    . Cholecystectomy    . Tonsillectomy    . Calcaneal fracture, right    . Orif hip fracture    . Esophagogastroduodenoscopy N/A 03/30/2013    Procedure: ESOPHAGOGASTRODUODENOSCOPY (EGD);  Surgeon: Jerene Bears, MD;  Location: [facility];  Service: Endoscopy;  Laterality: N/A;  . Video bronchoscopy with endobronchial navigation N/A 09/29/2015    Procedure: VIDEO BRONCHOSCOPY WITH ENDOBRONCHIAL NAVIGATION;  Surgeon: Grace Isaac, MD;  Location: [facility] OR;  Service: Thoracic;  Laterality: N/A;  . Lung biopsy N/A 09/29/2015    Procedure: LUNG BIOPSY;  Surgeon: Grace Isaac, MD;  Location: [facility];  Service: Thoracic;  Laterality: N/A;  . Fuducial placement N/A 09/29/2015    Procedure:  PLACEMENT OF FUDUCIAL Marker times three;  Surgeon: Grace Isaac, MD;  Location: [facility] OR;  Service: Thoracic;  Laterality: N/A;    Family History  Problem Relation Age of Onset  . Cancer Father   . Heart disease Mother   . Diabetes Neg Hx   . Hyperlipidemia Neg Hx   . Hypertension Neg Hx     Social History   Social History  . Marital Status: Single    Spouse Name: N/A  . Number of Children: 1  . Years of Education: 16   Occupational History  . reitred    Social History Main Topics  . Smoking status: Never Smoker   . Smokeless tobacco: Never Used  . Alcohol Use: 0.6 oz/week    1 Glasses of wine per week  . Drug Use: No  . Sexual Activity: No   Other Topics Concern  . Not on file   Social History Narrative   [employer] degrees in Development worker, community. married for 2 years then single . 1 son - Harvie Junior [place]. 2 daughters-put up for adoption. Lives alone and is independent in ADL's. works at [employer] until injured.    History  Smoking status  . Never Smoker   Smokeless tobacco  . Never Used    History  Alcohol Use  . 0.6 oz/week  . 1 Glasses of wine per week     Allergies  Allergen Reactions  . Peanut-Containing Drug Products Other (See Comments)    angioedema    Current Outpatient Prescriptions  Medication Sig Dispense Refill  . acetaminophen (TYLENOL) 500 MG tablet Take 1,000 mg by mouth 2 (two) times daily as needed for moderate pain.     [name] amLODipine (NORVASC) 2.5 MG tablet TAKE 1 TABLET BY MOUTH EVERY DAY 30 tablet 11  . atorvastatin (LIPITOR) 20 MG tablet Take 1 tablet (20 mg total) by mouth daily. 30 tablet 11  . nitroGLYCERIN (NITROSTAT) 0.4 MG SL tablet Place 0.4 mg under the tongue every 5 (five) minutes as needed for chest pain (x 3 doses).    . pantoprazole (PROTONIX) 40 MG tablet Take 40 mg by mouth daily.    . Wheat Dextrin (BENEFIBER DRINK MIX) PACK Take 1 scoop by mouth 3 (three) times daily.      No current facility-administered medications for this visit.      Review of Systems:     Cardiac  Review of Systems: Y or N  Chest Pain [  y  ]  Resting SOB [ y  ] Exertional SOB  Blue.Reese  ]  Orthopnea [ n ]   Pedal Edema Blue.Reese   ]    Palpitations Florencio.Farrier  ] Syncope  [  n ]   Presyncope [  n ]  General Review of Systems: [Y] = yes [  ]=no Constitional: recent weight change [  ];  Wt loss over the last 3 months [   ] anorexia [  ]; fatigue [ y ]; nausea [  ]; night sweats [  ]; fever [  ]; or chills [  ];          Dental: poor dentition[  ]; Last Dentist visit:   Eye : blurred vision [  ]; diplopia [   ]; vision changes [  ];  Amaurosis fugax[  ]; Resp: cough Blue.Reese  ];  wheezing[y];  hemoptysis[n  ]; shortness of breath[y  ]; paroxysmal nocturnal dyspnea[ y ]; dyspnea on exertion[y  ]; or orthopnea[  ];  GI:  gallstones[  ], vomiting[  ];  dysphagia[  ]; melena[  ];  hematochezia [  ]; heartburn[  ];   Hx of  Colonoscopy[  ]; GU: kidney stones [  ]; hematuria[  ];   dysuria [  ];  nocturia[  ];  history of     obstruction [  ]; urinary frequency [  ]             Skin: rash, swelling[  ];, hair loss[  ];  peripheral edema[  ];  or itching[  ]; Musculosketetal: myalgias[  ];  joint swelling[  ];  joint erythema[  ];  joint pain[  ];  back pain[  ];  Heme/Lymph: bruising[  ];  bleeding[  ];  anemia[  ];  Neuro: TIA[n  ];  headaches[ n ];  stroke[ n ];  vertigo[  ];  seizures[ n ];   paresthesias[  ];  difficulty walking[ yes with cane ];  Psych:depression[  ]; anxiety[  ];  Endocrine: diabetes[  ];  thyroid dysfunction[  ];  Immunizations: Flu up to date [  ]; Pneumococcal up to date [  ];  Other:  Physical Exam: Ht '5\' 2"'$  (1.575 m)  SpO2   PHYSICAL EXAMINATION: General appearance: alert, cooperative, appears older than stated age and fatigued Head: Normocephalic, without obvious abnormality, atraumatic Neck: no adenopathy, no carotid bruit, no JVD, supple, symmetrical, trachea midline and thyroid not enlarged, symmetric, no tenderness/mass/nodules Lymph nodes: Cervical, supraclavicular, and axillary nodes  normal. Resp: clear to auscultation bilaterally Back: symmetric, no curvature. ROM normal. No CVA tenderness. Cardio: regular rate and rhythm, S1, S2 normal, no murmur, click, rub or gallop GI: soft, non-tender; bowel sounds normal; no masses,  no organomegaly Extremities: extremities normal, atraumatic, no cyanosis or edema and Homans sign is negative, no sign of DVT Neurologic: Grossly normal Patient ambulates in the office but has a for very slow gait   Diagnostic Studies & Laboratory data:     Recent Radiology Findings:   Nm Pet Image Initial (pi) Skull Base To Thigh  09/25/2015  CLINICAL DATA:  Initial treatment strategy for lung nodule. EXAM: NUCLEAR MEDICINE PET SKULL BASE TO THIGH TECHNIQUE: 5.4 mCi F-18 FDG was injected intravenously. Full-ring PET imaging was performed from the skull base to thigh after the radiotracer. CT data was obtained and used for attenuation correction and anatomic localization. FASTING BLOOD GLUCOSE:  Value: 78 mg/dl COMPARISON:  Coronary CTA 09/14/2015. FINDINGS: NECK No hypermetabolic lymph nodes in the neck. CT images show no acute findings. CHEST Dominant spiculated nodular lesion in the perihilar right upper lobe measures 1.0 x 2.3 cm, stable when remeasured, with an SUV max of 9.7.  Adjacent nodule (CT image 30, series 6) is also hypermetabolic. No hypermetabolic mediastinal, hilar or axillary lymph nodes. No additional hypermetabolic pulmonary nodules. CT images show coronary artery calcification. Small amount of pericardial fluid is likely physiologic. No pleural effusion. ABDOMEN/PELVIS No abnormal hypermetabolism in the liver, adrenal glands, spleen or pancreas. No hypermetabolic lymph nodes. CT images show the liver and adrenal glands to be grossly unremarkable. Cholecystectomy. Scarring in the right kidney. Kidneys, spleen, pancreas, stomach and bowel are grossly unremarkable. SKELETON No abnormal osseous hypermetabolism. IMPRESSION: 1. Hypermetabolic  nodularity in the right upper lobe, worrisome for primary bronchogenic carcinoma. An infectious process is not excluded but is considered less likely. 2. Coronary artery calcification, recently evaluated on 09/14/2015. Electronically Signed   By: Lorin Picket M.D.   On: 09/25/2015 13:09   Ct Coronary Morp W/cta Cor W/score W/ca W/cm &/or Wo/cm  09/14/2015  ADDENDUM REPORT: 09/14/2015 23:17 CLINICAL DATA:  Chest pain EXAM: Cardiac CTA MEDICATIONS: Sub lingual nitro. '4mg'$  and lopressor '5mg'$  IV x 1 TECHNIQUE: The patient was scanned on a Philips [id number] slice scanner. Gantry rotation speed was 270 msecs. Collimation was .31m. A 100 kV prospective scan was triggered in the descending thoracic aorta at 111 HU's with 5% padding centered around 78% of the R-R interval. Average HR during the scan was 65 bpm. The 3D data set was interpreted on a dedicated work station using MPR, MIP and VRT modes. A total of 80cc of contrast was used. FINDINGS: Non-cardiac: [facility]. Calcium Score:  101 Agatston units. Coronary Arteries: Right dominant with no anomalies LM: There was calcified plaque at the left main ostium with no more than mild stenosis. LAD system: Mixed plaque in the proximal LAD with mild stenosis. No significant disease in the remainder of the LAD. Circumflex system: No plaque or stenosis. Smalll OM1, moderate OM2, and large PLOM noted. RCA: Motion artifact affects the distal RCA, but the vessel is interpretable. No plaque or stenosis noted. IMPRESSION: 1.  Nonobstructive coronary disease. 2. Coronary artery calcium score of 101 Agatston units, placing the patient in the 54th percentile for age and gender. This suggests intermediate risk of future cardiac events. Dalton Mclean Electronically Signed   By: DLoralie ChampagneM.D.   On: 09/14/2015 23:17  09/14/2015  EXAM: OVER-READ INTERPRETATION  CT CHEST The following report is an over-read performed by radiologist Dr. [facility]Rebekah Chesterfield [facility] on 09/14/2015. This over-read does not include interpretation of cardiac or coronary anatomy or pathology. The coronary calcium score/coronary CTA interpretation by the cardiologist is attached. COMPARISON:  No priors. FINDINGS: In the inferior aspect of the right upper lobe there is a 2.8 x 1.5 cm highly spiculated nodule (image 11 of series 204) which is highly concerning for primary bronchogenic carcinoma. There are satellite nodules emanating outward from this lesion in the right upper lobe, the largest of which measures up to 10 mm. Small 3 mm nodule associated with the minor fissure is nonspecific and may simply represent a subpleural lymph node. A few other scattered tiny 1-2 mm pulmonary nodules are noted throughout the lungs bilaterally, typically in the periphery, likely to represent areas of mucoid impaction within terminal bronchioles. No consolidative airspace disease. No pleural effusions. Within the visualized portions of the thorax there is no definite mediastinal or hilar lymphadenopathy. Visualized portions of the upper abdomen are unremarkable. There are no aggressive appearing lytic or blastic lesions noted in the visualized portions of the skeleton. IMPRESSION: 1. Highly spiculated  2.8 x 1.5 cm right upper lobe nodule with adjacent satellite nodules, highly concerning for potential primary bronchogenic carcinoma. Further evaluation with PET-CT and/or biopsy is strongly recommended in the near future for diagnostic and staging purposes. These results will be called to the ordering clinician or representative by the Radiologist Assistant, and communication documented in the PACS or zVision Dashboard. Electronically Signed: By: Vinnie Langton M.D. On: 09/14/2015 13:07   I have independently reviewed the above radiology studies  and reviewed the findings with the patient.   Recent Lab Findings: Lab Results  Component Value Date   WBC 3.8* 09/28/2015   HGB 13.0  09/28/2015   HCT 41.1 09/28/2015   PLT 231 09/28/2015   GLUCOSE 92 09/28/2015   CHOL 200 09/18/2015   TRIG 47 09/18/2015   HDL 88 09/18/2015   LDLDIRECT 115.1 03/19/2010   LDLCALC 103 09/18/2015   ALT 16 09/28/2015   AST 29 09/28/2015   NA 141 09/28/2015   K 3.6 09/28/2015   CL 105 09/28/2015   CREATININE 0.82 09/28/2015   BUN 12 09/28/2015   CO2 28 09/28/2015   TSH 5.76* 08/28/2015   INR 1.08 09/28/2015   HGBA1C 5.7* 09/18/2015   PFT's: PFT's: FEV1 1.23 72 % with bronchodilators +29% DLCO 11.46 53% The reduced diffusing capacity indicates a moderately severe loss of functional alveolar capillary surface. Maldistribution of ventilation is indicated by the difference between the alveolar volume and the total lung capacity. Conclusions: A reduced diffusing capacity, minimal airway obstruction and overinflation are characteristic of emphysema. The response to bronchodilators indicates a reversible component. In view of the severity of the diffusion defect, studies with exercise would be helpful to evaluate the presence of hypoxemia. Pulmonary Function Diagnosis: Minimal Obstructive Airways Disease -Emphysematous Type, Reversible Component   Assessment / Plan:   Possible Clinical stage II B carcinoma of the lung but without any pathologic diagnosis after attempted biopsy with navigation bronchoscopy., T3 lesion with 2 nodules in the same lobe Patient's films and pathology information was reviewed at the [facility] thoracic oncology conference this morning. I recommended to the patient that we attempt CT-guided needle biopsy of the lesion since the navigation bronchoscopy did not confirm a diagnosis. Dr. Reesa Chew from interventional radiologist has reviewed films and is willing to attempt CT-guided needle biopsy of the right upper lobe lung lesion. Following biopsy we will make arrangements for her to be seen in the multi-disciplinary thoracic oncology clinic and to see radiation  oncology.  She would be a poor candidate for a lobectomy with her age and overall poor physical condition, in addition she is followed by cardiology for atypical chest pain  Grace Isaac MD      [address] [place] [address] Office [phone]   Beeper [phone]  10/05/2015 1:19 PM

## 2015-10-05 NOTE — Progress Notes (Signed)
Cardiology Office Note:    Date:  10/06/2015   ID:  Cynthia Vargas, DOB 1932-06-02, MRN [id number]  PCP:  Hoyt Koch, MD  Cardiologist:  Dr. Fransico Him   Electrophysiologist:  n/a  Referring MD: Hoyt Koch, *   Chief Complaint  Patient presents with  . Chest Pain    History of Present Illness:     Cynthia Vargas is a 82 y.o. female with a hx of HTN, chest pain, GERD. Nuclear stress test in 5/15 was normal. Previous chest pain resolved with PPI. She was seen by Angelena Form, PA-C in 08/2015 for evaluation of CP.  Nuclear stress test was low risk. She continued to complain of fatigue. She then underwent coronary CTA. This demonstrated calcium score of 101 and nonobstructive CAD. There was also a spiculated right upper lobe nodule noted. PET scan demonstrated hypermetabolic activity. She was seen by Dr. Servando Snare. Navigation bronchoscopy with Biopsy was negative for malignant findings. However, it is felt that she has possible stage IIB carcinoma of the lung. Plan is to attempt CT-guided needle biopsy of the lesion. She called in yesterday with extreme chest pain, nausea and vomiting. She is added on for further evaluation.  She had her Bx on 3/31 (Friday).  Sunday AM she awoke with severe substernal chest pain at 3 am.  It lasted 3 hours.  She had to sit up to help her symptoms.  She felt a little short of breath.  Denies radiating symptoms.  Denies syncope.  She sleeps on a incline chronically without change.  Denies PND, edema.  She denies assoc belching, dysphagia.  Denies melena or hematochezia.  She did feel nauseated with her CP and did vomit.  She has not had further CP. She denies significant dyspnea.  Denies pleuritic chest pain or chest pain with lying supine.     Past Medical History  Diagnosis Date  . CALCANEAL FRACTURE, RIGHT 11/04/2007  . DILATION AND CURETTAGE, HX OF 11/04/2007  . KNEE PAIN, RIGHT, CHRONIC 03/19/2010  . OSTEOPOROSIS 05/02/2007  .  PEPTIC ULCER DISEASE 05/02/2007  . Personal History of Other Diseases of Digestive Disease 11/04/2007  . Unspecified essential hypertension 05/02/2007  . GERD (gastroesophageal reflux disease)     Past Surgical History  Procedure Laterality Date  . Dilation and curettage of uterus    . Cholecystectomy    . Tonsillectomy    . Calcaneal fracture, right    . Orif hip fracture    . Esophagogastroduodenoscopy N/A 03/30/2013    Procedure: ESOPHAGOGASTRODUODENOSCOPY (EGD);  Surgeon: Jerene Bears, MD;  Location: [facility];  Service: Endoscopy;  Laterality: N/A;  . Video bronchoscopy with endobronchial navigation N/A 09/29/2015    Procedure: VIDEO BRONCHOSCOPY WITH ENDOBRONCHIAL NAVIGATION;  Surgeon: Grace Isaac, MD;  Location: [facility] OR;  Service: Thoracic;  Laterality: N/A;  . Lung biopsy N/A 09/29/2015    Procedure: LUNG BIOPSY;  Surgeon: Grace Isaac, MD;  Location: [facility];  Service: Thoracic;  Laterality: N/A;  . Fuducial placement N/A 09/29/2015    Procedure:  PLACEMENT OF FUDUCIAL Marker times three;  Surgeon: Grace Isaac, MD;  Location: [place];  Service: Thoracic;  Laterality: N/A;    Current Medications: Outpatient Prescriptions Prior to Visit  Medication Sig Dispense Refill  . acetaminophen (TYLENOL) 500 MG tablet Take 1,000 mg by mouth 2 (two) times daily as needed for moderate pain.     [name] atorvastatin (LIPITOR) 20 MG tablet Take 1 tablet (20 mg total)  by mouth daily. 30 tablet 11  . nitroGLYCERIN (NITROSTAT) 0.4 MG SL tablet Place 0.4 mg under the tongue every 5 (five) minutes as needed for chest pain (x 3 doses).    . pantoprazole (PROTONIX) 40 MG tablet Take 40 mg by mouth daily.    . Wheat Dextrin (BENEFIBER DRINK MIX) PACK Take 1 scoop by mouth 3 (three) times daily.     [name] amLODipine (NORVASC) 2.5 MG tablet TAKE 1 TABLET BY MOUTH EVERY DAY 30 tablet 11   No facility-administered medications prior to visit.     Allergies:   Peanut-containing drug products   Social  History   Social History  . Marital Status: Single    Spouse Name: N/A  . Number of Children: 1  . Years of Education: 16   Occupational History  . reitred    Social History Main Topics  . Smoking status: Never Smoker   . Smokeless tobacco: Never Used  . Alcohol Use: 0.6 oz/week    1 Glasses of wine per week  . Drug Use: No  . Sexual Activity: No   Other Topics Concern  . None   Social History Narrative   [employer]'s degrees in Development worker, community. married for 2 years then single . 1 son - Harvie Junior [place]. 2 daughters-put up for adoption. Lives alone and is independent in ADL's. works at [employer] until injured.     Family History:  The patient's family history includes Cancer in her father; Heart disease in her mother. There is no history of Diabetes, Hyperlipidemia, or Hypertension.   ROS:   Please see the history of present illness.    Review of Systems  Cardiovascular: Positive for chest pain.  Respiratory: Positive for shortness of breath.   Neurological: Positive for dizziness.   All other systems reviewed and are negative.   Physical Exam:    VS:  BP 130/70 mmHg  Pulse 68  Ht '5\' 2"'$  (1.575 m)  Wt 106 lb 6.4 oz (48.263 kg)  BMI 19.46 kg/m2   GEN: Well nourished, well developed, in no acute distress HEENT: normal Neck: no JVD, no masses Cardiac: Normal S1/S2,  RRR; no murmurs, rubs, or gallops, no edema    Respiratory:  clear to auscultation bilaterally; no wheezing, rhonchi or rales GI: soft, nontender, nondistended MS: no deformity or atrophy Skin: warm and dry Neuro: No focal deficits  Psych: Alert and oriented x 3, normal affect  Wt Readings from Last 3 Encounters:  10/06/15 106 lb 6.4 oz (48.263 kg)  10/05/15 107 lb (48.535 kg)  09/29/15 107 lb 12.9 oz (48.901 kg)      Studies/Labs Reviewed:     EKG:  EKG is   ordered today.  The ekg ordered today demonstrates NSR, HR 69, normal axis, QTc 435 ms, no changes.   Recent Labs: 08/28/2015: TSH  5.76* 09/28/2015: ALT 16; BUN 12; Creatinine, Ser 0.82; Hemoglobin 13.0; Platelets 231; Potassium 3.6; Sodium 141   Recent Lipid Panel    Component Value Date/Time   CHOL 200 09/18/2015 1039   TRIG 47 09/18/2015 1039   HDL 88 09/18/2015 1039   CHOLHDL 2.3 09/18/2015 1039   VLDL 9 09/18/2015 1039   LDLCALC 103 09/18/2015 1039   LDLDIRECT 115.1 03/19/2010 1406    Additional studies/ records that were reviewed today include:   CXR 09/29/15 No pneumothorax post lung bx  Coronary CTA 09/14/15 Calcium score 101 LM stenosis LAD proximal mild stenosis LCx okay RCA no plaque or stenosis 2.8 x  1.5 cm RUL nodule-spiculated  Myoview 08/14/15 Perfusion Summary Defect 1: There is a medium defect of mild severity present in the basal anteroseptal, basal inferoseptal, mid anteroseptal and mid inferoseptal location. The defect is non-reversible. Overall Study Impression Myocardial perfusion is abnormal. This is a low risk study. Overall left ventricular systolic function was normal. LV cavity size is normal. Nuclear stress EF: 77%. The left ventricular ejection fraction is hyperdynamic (>65%). A prior study was conducted on 10/28/2013.There are no significant changes in comparison to the prior study.  Echo 4/15 EF 55-60%, normal wall motion, normal diastolic function, mild AI, moderate TR, trivial pericardial effusion   ASSESSMENT:     1. Precordial pain   2. Coronary artery disease involving native coronary artery of native heart without angina pectoris   3. Gastroesophageal reflux disease without esophagitis   4. Lung mass     PLAN:     In order of problems listed above:  1. Chest pain - She had a severe episode of chest pain several nights ago.  No recurrence.  O2 sats are normal.  She is not tachycardic.  She had a recent lung bx but post op CXR was unremarkable. CTA demonstrated non-obstructive disease only.  She has not had a recurrence.  ECG is unchanged.  She has a long hx of  GERD.  I suspect this was the cause of her symptoms.    - Obtain echo to assess for WMA.  If new WMA, will need cath  - Increase Protonix to bid x 1 week to cover for GERD causing symptoms  - increase Norvasc to 5 mg QD to cover for spasm  2. CAD - Nonobstructive on CTA.  She cannot take ASA. Continue statin, Amlodipine.  Check echo as noted.  3. GERD - Adjust PPI to cover for GERD.  4. Lung mass - FU with TCTS as planned.     Medication Adjustments/Labs and Tests Ordered: Current medicines are reviewed at length with the patient today.  Concerns regarding medicines are outlined above.  Medication changes, Labs and Tests ordered today are outlined in the Patient Instructions noted below. Patient Instructions  Medication Instructions:  1. INCREASE PROTONIX TO TWICE DAILY FOR 1 WEEK THEN GO BACK TO YOUR REGULAR ONCE A DAY DOSE 2. INCREASE AMLODIPINE TO 5 MG DAILY; NEW RX SENT IN Labwork: NONE Testing/Procedures: Your physician has requested that you have an echocardiogram. Echocardiography is a painless test that uses sound waves to create images of your heart. It provides your doctor with information about the size and shape of your heart and how well your heart's chambers and valves are working. This procedure takes approximately one hour. There are no restrictions for this procedure. Follow-Up: DR. Radford Pax 3-4 WEEKS  Any Other Special Instructions Will Be Listed Below (If Applicable). If you need a refill on your cardiac medications before your next appointment, please call your pharmacy.   Signed, Richardson Dopp, PA-C  10/06/2015 1:26 PM    [facility] [address], [place], [place]  [address] Phone: [phone]; Fax: [phone]

## 2015-10-06 ENCOUNTER — Encounter: Payer: Self-pay | Admitting: Physician Assistant

## 2015-10-06 ENCOUNTER — Ambulatory Visit (INDEPENDENT_AMBULATORY_CARE_PROVIDER_SITE_OTHER): Payer: Medicare Other | Admitting: Physician Assistant

## 2015-10-06 VITALS — BP 130/70 | HR 68 | Ht 62.0 in | Wt 106.4 lb

## 2015-10-06 DIAGNOSIS — R079 Chest pain, unspecified: Secondary | ICD-10-CM

## 2015-10-06 DIAGNOSIS — R918 Other nonspecific abnormal finding of lung field: Secondary | ICD-10-CM | POA: Diagnosis not present

## 2015-10-06 DIAGNOSIS — I2510 Atherosclerotic heart disease of native coronary artery without angina pectoris: Secondary | ICD-10-CM

## 2015-10-06 DIAGNOSIS — R072 Precordial pain: Secondary | ICD-10-CM

## 2015-10-06 DIAGNOSIS — K219 Gastro-esophageal reflux disease without esophagitis: Secondary | ICD-10-CM

## 2015-10-06 DIAGNOSIS — I2584 Coronary atherosclerosis due to calcified coronary lesion: Secondary | ICD-10-CM

## 2015-10-06 MED ORDER — AMLODIPINE BESYLATE 5 MG PO TABS: 5.0000 mg | ORAL_TABLET | Freq: Every day | ORAL | Status: DC

## 2015-10-06 NOTE — Patient Instructions (Addendum)
Medication Instructions:  1. INCREASE PROTONIX TO TWICE DAILY FOR 1 WEEK THEN GO BACK TO YOUR REGULAR ONCE A DAY DOSE 2. INCREASE AMLODIPINE TO 5 MG DAILY; NEW RX SENT IN Labwork: NONE Testing/Procedures: Your physician has requested that you have an echocardiogram. Echocardiography is a painless test that uses sound waves to create images of your heart. It provides your doctor with information about the size and shape of your heart and how well your heart's chambers and valves are working. This procedure takes approximately one hour. There are no restrictions for this procedure. Follow-Up: DR. Radford Pax 3-4 WEEKS  Any Other Special Instructions Will Be Listed Below (If Applicable). If you need a refill on your cardiac medications before your next appointment, please call your pharmacy.

## 2015-10-06 NOTE — Telephone Encounter (Signed)
See OV from today Richardson Dopp, PA-C   10/06/2015 2:56 PM

## 2015-10-08 DIAGNOSIS — R918 Other nonspecific abnormal finding of lung field: Secondary | ICD-10-CM | POA: Insufficient documentation

## 2015-10-09 ENCOUNTER — Telehealth: Payer: Self-pay

## 2015-10-09 NOTE — Telephone Encounter (Signed)
need f/u 4 wks appt with turner after echo on 10-25-15  Received: 3 days ago    Leane Call, RN    Rescheduled patient's OV from 5/30 to 5/3 to review ECHO results. Patient was grateful for assistance.

## 2015-10-25 ENCOUNTER — Ambulatory Visit (HOSPITAL_COMMUNITY): Payer: Medicare Other | Attending: Cardiovascular Disease

## 2015-10-25 ENCOUNTER — Other Ambulatory Visit: Payer: Self-pay

## 2015-10-25 DIAGNOSIS — R072 Precordial pain: Secondary | ICD-10-CM | POA: Diagnosis not present

## 2015-10-25 DIAGNOSIS — I351 Nonrheumatic aortic (valve) insufficiency: Secondary | ICD-10-CM | POA: Insufficient documentation

## 2015-10-25 DIAGNOSIS — I071 Rheumatic tricuspid insufficiency: Secondary | ICD-10-CM | POA: Diagnosis not present

## 2015-10-25 DIAGNOSIS — I313 Pericardial effusion (noninflammatory): Secondary | ICD-10-CM | POA: Insufficient documentation

## 2015-10-25 DIAGNOSIS — R079 Chest pain, unspecified: Secondary | ICD-10-CM | POA: Diagnosis present

## 2015-10-25 DIAGNOSIS — I2510 Atherosclerotic heart disease of native coronary artery without angina pectoris: Secondary | ICD-10-CM | POA: Insufficient documentation

## 2015-10-26 ENCOUNTER — Encounter: Payer: Self-pay | Admitting: Physician Assistant

## 2015-11-01 ENCOUNTER — Ambulatory Visit (INDEPENDENT_AMBULATORY_CARE_PROVIDER_SITE_OTHER): Payer: Medicare Other | Admitting: Cardiology

## 2015-11-01 ENCOUNTER — Encounter: Payer: Self-pay | Admitting: Cardiology

## 2015-11-01 VITALS — BP 102/62 | HR 72 | Ht 62.0 in | Wt 106.2 lb

## 2015-11-01 DIAGNOSIS — I313 Pericardial effusion (noninflammatory): Secondary | ICD-10-CM | POA: Insufficient documentation

## 2015-11-01 DIAGNOSIS — I2584 Coronary atherosclerosis due to calcified coronary lesion: Secondary | ICD-10-CM

## 2015-11-01 DIAGNOSIS — I2510 Atherosclerotic heart disease of native coronary artery without angina pectoris: Secondary | ICD-10-CM | POA: Diagnosis not present

## 2015-11-01 DIAGNOSIS — I3139 Other pericardial effusion (noninflammatory): Secondary | ICD-10-CM | POA: Insufficient documentation

## 2015-11-01 DIAGNOSIS — R079 Chest pain, unspecified: Secondary | ICD-10-CM | POA: Diagnosis not present

## 2015-11-01 DIAGNOSIS — I319 Disease of pericardium, unspecified: Secondary | ICD-10-CM

## 2015-11-01 DIAGNOSIS — I309 Acute pericarditis, unspecified: Secondary | ICD-10-CM | POA: Insufficient documentation

## 2015-11-01 DIAGNOSIS — I10 Essential (primary) hypertension: Secondary | ICD-10-CM

## 2015-11-01 HISTORY — DX: Acute pericarditis, unspecified: I30.9

## 2015-11-01 MED ORDER — COLCHICINE 0.6 MG PO TABS: 0.6000 mg | ORAL_TABLET | Freq: Every day | ORAL | Status: DC

## 2015-11-01 MED ORDER — IBUPROFEN 600 MG PO TABS: 600.0000 mg | ORAL_TABLET | Freq: Three times a day (TID) | ORAL | Status: DC

## 2015-11-01 NOTE — Progress Notes (Signed)
Cardiology Office Note    Date:  11/01/2015   ID:  Cynthia, Vargas [birth year], MRN [id number]  PCP:  Hoyt Koch, MD  Cardiologist:  Sueanne Margarita, MD   Chief Complaint  Patient presents with  . Follow-up    HTN  . Chest Pain    History of Present Illness:  Cynthia Vargas is a 82 y.o. female with a hx of HTN, chest pain, GERD Nuclear stress test was low risk in 2017 and coronary CTA with calcium score of 101 and nonobstructive CAD. At the time of CT there was also a spiculated right upper lobe nodule noted. PET scan demonstrated hypermetabolic activity. She was seen by Dr. Servando Snare. Navigation bronchoscopy with Biopsy was negative for malignant findings. However, it is felt that she has possible stage IIB carcinoma of the lung. Plan is to attempt CT-guided needle biopsy of the lesion. She was recently seen by our PA complaining of extreme chest pain, nausea and vomiting. 2D echo showed mild to moderate pericardial effusion and she is now here for further evaluation. She continues to have chest discomfort that she describes as a pressure.  She hasn't really noticed if her CP is worse with deep breathing or with position.  She thinks that her SOB has gotten worse.  She is now having some LE edema.  She has had increased dizziness and weakness.      Past Medical History  Diagnosis Date  . CALCANEAL FRACTURE, RIGHT 11/04/2007  . DILATION AND CURETTAGE, HX OF 11/04/2007  . KNEE PAIN, RIGHT, CHRONIC 03/19/2010  . OSTEOPOROSIS 05/02/2007  . PEPTIC ULCER DISEASE 05/02/2007  . Personal History of Other Diseases of Digestive Disease 11/04/2007  . Unspecified essential hypertension 05/02/2007  . GERD (gastroesophageal reflux disease)   . History of echocardiogram     Echo 4/17: EF 55-60%, normal wall motion, normal diastolic function, mild AI, moderate TR, small to moderate pericardial effusion  . Pericardial effusion 11/01/2015    Past Surgical History  Procedure Laterality  Date  . Dilation and curettage of uterus    . Cholecystectomy    . Tonsillectomy    . Calcaneal fracture, right    . Orif hip fracture    . Esophagogastroduodenoscopy N/A 03/30/2013    Procedure: ESOPHAGOGASTRODUODENOSCOPY (EGD);  Surgeon: Jerene Bears, MD;  Location: [facility];  Service: Endoscopy;  Laterality: N/A;  . Video bronchoscopy with endobronchial navigation N/A 09/29/2015    Procedure: VIDEO BRONCHOSCOPY WITH ENDOBRONCHIAL NAVIGATION;  Surgeon: Grace Isaac, MD;  Location[facility];  Service: Thoracic;  Laterality: N/A;  . Lung biopsy N/A 09/29/2015    Procedure: LUNG BIOPSY;  Surgeon: Grace Isaac, MD;  Location: [facility];  Service: Thoracic;  Laterality: N/A;  . Fuducial placement N/A 09/29/2015    Procedure:  PLACEMENT OF FUDUCIAL Marker times three;  Surgeon: Grace Isaac, MD;  Location: [facility];  Service: Thoracic;  Laterality: N/A;    Current Medications: Outpatient Prescriptions Prior to Visit  Medication Sig Dispense Refill  . acetaminophen (TYLENOL) 500 MG tablet Take 1,000 mg by mouth 2 (two) times daily as needed for moderate pain.     [name] amLODipine (NORVASC) 5 MG tablet Take 1 tablet (5 mg total) by mouth daily. 90 tablet 3  . atorvastatin (LIPITOR) 20 MG tablet Take 1 tablet (20 mg total) by mouth daily. 30 tablet 11  . nitroGLYCERIN (NITROSTAT) 0.4 MG SL tablet Place 0.4 mg under the tongue every 5 (five) minutes as  needed for chest pain (x 3 doses).    . pantoprazole (PROTONIX) 40 MG tablet Take 40 mg by mouth daily.    . Wheat Dextrin (BENEFIBER DRINK MIX) PACK Take 1 scoop by mouth 3 (three) times daily.      No facility-administered medications prior to visit.     Allergies:   Peanut-containing drug products   Social History   Social History  . Marital Status: Single    Spouse Name: N/A  . Number of Children: 1  . Years of Education: 16   Occupational History  . reitred    Social History Main Topics  . Smoking status: Never Smoker   .  Smokeless tobacco: Never Used  . Alcohol Use: 0.6 oz/week    1 Glasses of wine per week  . Drug Use: No  . Sexual Activity: No   Other Topics Concern  . None   Social History Narrative   [employer]'s degrees in Development worker, community. married for 2 years then single . 1 son - Harvie Junior [place]. 2 daughters-put up for adoption. Lives alone and is independent in ADL's. works at [employer] until injured.     Family History:  The patient's family history includes Cancer in her father; Heart disease in her mother. There is no history of Diabetes, Hyperlipidemia, or Hypertension.   ROS:   Please see the history of present illness.    ROS All other systems reviewed and are negative.   PHYSICAL EXAM:   VS:  BP 102/62 mmHg  Pulse 72  Ht 5' 2"  (1.575 m)  Wt 106 lb 3.2 oz (48.172 kg)  BMI 19.42 kg/m2   GEN: Well nourished, well developed, in no acute distress HEENT: normal Neck: no JVD, carotid bruits, or masses Cardiac: RRR; no murmurs, rubs, or gallops,no edema.  Intact distal pulses bilaterally.  Respiratory:  clear to auscultation bilaterally, normal work of breathing GI: soft, nontender, nondistended, + BS MS: no deformity or atrophy Skin: warm and dry, no rash Neuro:  Alert and Oriented x 3, Strength and sensation are intact Psych: euthymic mood, full affect  Wt Readings from Last 3 Encounters:  11/01/15 106 lb 3.2 oz (48.172 kg)  10/06/15 106 lb 6.4 oz (48.263 kg)  10/05/15 107 lb (48.535 kg)      Studies/Labs Reviewed:   EKG:  EKG is not ordered today.    Recent Labs: 08/28/2015: TSH 5.76* 09/28/2015: ALT 16; BUN 12; Creatinine, Ser 0.82; Hemoglobin 13.0; Platelets 231; Potassium 3.6; Sodium 141   Lipid Panel    Component Value Date/Time   CHOL 200 09/18/2015 1039   TRIG 47 09/18/2015 1039   HDL 88 09/18/2015 1039   CHOLHDL 2.3 09/18/2015 1039   VLDL 9 09/18/2015 1039   LDLCALC 103 09/18/2015 1039   LDLDIRECT 115.1 03/19/2010 1406    Additional studies/ records that were  reviewed today include:  2D echo    ASSESSMENT:    1. Pericardial effusion   2. Essential hypertension   3. Chest pain, unspecified chest pain type      PLAN:  In order of problems listed above:  1. Pericardial effusion - mild to moderate by recent echo.  With her potential diagnosis of lung CA we need to consider malignant pericardial effusion which would change her staging diagnosis.  She had a bronchoscopy in March but tissue diagnosis with not definitive.  She is supposed to have a CT guided lung bx but she has not gone through with this yet.  I have  encouraged her to go ahead with the bx to get a definitive dx to aide in treatment before there is progression of disease.  I have forwarded her echo to Dr. Servando Snare.  For now I will treat this as acute pericarditis and start Ibuprofen 668m TID and cochicine 0.654mdaily for 2 weeks and reassess echo at that time.  If effusion is larger then I think we should consider pericardiocentesis for diagnosis.  I will check a CRP and sed rate today.  2. HTN - controlled on current medical regimen. Continue amlodipine.  3. Chest pain - I suspect that her CP is related to her underlying pericardia effusion as her coronary CTA showed nonobstructive ASCAD.    She will followup with PA in 2 weeks to reassess her clinical status and echo.    Medication Adjustments/Labs and Tests Ordered: Current medicines are reviewed at length with the patient today.  Concerns regarding medicines are outlined above.  Medication changes, Labs and Tests ordered today are listed in the Patient Instructions below.  Patient Instructions  Medication Instructions:  1) START IBUPROFEN 600 mg THREE TIMES DAILY for 2 WEEKS 2) START COLCHICINE 0.6 mg DAILY  Labwork: TODAY: CRP, ESR  IN ONE WEEK: CBC  Testing/Procedures: Your physician has requested that you have an echocardiogram in 2 weeks. Echocardiography is a painless test that uses sound waves to create images of  your heart. It provides your doctor with information about the size and shape of your heart and how well your heart's chambers and valves are working. This procedure takes approximately one hour. There are no restrictions for this procedure.  Follow-Up: Your physician recommends that you schedule a follow-up appointment in 2 WEEKS with a PA or NP.  Your physician recommends that you schedule a follow-up appointment in 3 months with Dr. TuRadford Pax Any Other Special Instructions Will Be Listed Below (If Applicable).     If you need a refill on your cardiac medications before your next appointment, please call your pharmacy.       SiLurena NidaMD  11/01/2015 3:36 PM    [facility] [address]  [id number]: [phone]: [phone]

## 2015-11-01 NOTE — Patient Instructions (Signed)
Medication Instructions:  1) START IBUPROFEN 600 mg THREE TIMES DAILY for 2 WEEKS 2) START COLCHICINE 0.6 mg DAILY  Labwork: TODAY: CRP, ESR  IN ONE WEEK: CBC  Testing/Procedures: Your physician has requested that you have an echocardiogram in 2 weeks. Echocardiography is a painless test that uses sound waves to create images of your heart. It provides your doctor with information about the size and shape of your heart and how well your heart's chambers and valves are working. This procedure takes approximately one hour. There are no restrictions for this procedure.  Follow-Up: Your physician recommends that you schedule a follow-up appointment in 2 WEEKS with a PA or NP.  Your physician recommends that you schedule a follow-up appointment in 3 months with Dr. Radford Pax.  Any Other Special Instructions Will Be Listed Below (If Applicable).     If you need a refill on your cardiac medications before your next appointment, please call your pharmacy.

## 2015-11-02 LAB — C-REACTIVE PROTEIN: CRP: <0.5 mg/dL (ref <0.60)

## 2015-11-02 LAB — SEDIMENTATION RATE: Sed Rate: 5 mm/hr (ref 0–30)

## 2015-11-08 ENCOUNTER — Other Ambulatory Visit: Payer: Medicare Other

## 2015-11-15 ENCOUNTER — Other Ambulatory Visit (HOSPITAL_COMMUNITY): Payer: Medicare Other

## 2015-11-16 ENCOUNTER — Ambulatory Visit: Payer: Medicare Other | Admitting: Physician Assistant

## 2015-11-22 ENCOUNTER — Ambulatory Visit (HOSPITAL_COMMUNITY): Payer: Medicare Other | Attending: Cardiovascular Disease

## 2015-11-22 ENCOUNTER — Other Ambulatory Visit (INDEPENDENT_AMBULATORY_CARE_PROVIDER_SITE_OTHER): Payer: Medicare Other | Admitting: *Deleted

## 2015-11-22 ENCOUNTER — Other Ambulatory Visit: Payer: Self-pay

## 2015-11-22 ENCOUNTER — Other Ambulatory Visit (HOSPITAL_COMMUNITY): Payer: Medicare Other

## 2015-11-22 ENCOUNTER — Other Ambulatory Visit: Payer: Self-pay | Admitting: *Deleted

## 2015-11-22 DIAGNOSIS — I071 Rheumatic tricuspid insufficiency: Secondary | ICD-10-CM | POA: Diagnosis not present

## 2015-11-22 DIAGNOSIS — Z0000 Encounter for general adult medical examination without abnormal findings: Secondary | ICD-10-CM

## 2015-11-22 DIAGNOSIS — I517 Cardiomegaly: Secondary | ICD-10-CM | POA: Diagnosis not present

## 2015-11-22 DIAGNOSIS — I313 Pericardial effusion (noninflammatory): Secondary | ICD-10-CM | POA: Diagnosis not present

## 2015-11-22 DIAGNOSIS — I319 Disease of pericardium, unspecified: Secondary | ICD-10-CM | POA: Diagnosis not present

## 2015-11-22 DIAGNOSIS — I10 Essential (primary) hypertension: Secondary | ICD-10-CM | POA: Diagnosis not present

## 2015-11-22 DIAGNOSIS — I351 Nonrheumatic aortic (valve) insufficiency: Secondary | ICD-10-CM | POA: Diagnosis not present

## 2015-11-22 DIAGNOSIS — R079 Chest pain, unspecified: Secondary | ICD-10-CM | POA: Diagnosis not present

## 2015-11-22 DIAGNOSIS — I3139 Other pericardial effusion (noninflammatory): Secondary | ICD-10-CM

## 2015-11-22 DIAGNOSIS — I340 Nonrheumatic mitral (valve) insufficiency: Secondary | ICD-10-CM | POA: Insufficient documentation

## 2015-11-22 LAB — HEPATIC FUNCTION PANEL
ALT: 17 U/L (ref 6–29)
AST: 26 U/L (ref 10–35)
Albumin: 4.3 g/dL (ref 3.6–5.1)
Alkaline Phosphatase: 45 U/L (ref 33–130)
Bilirubin, Direct: 0.1 mg/dL (ref <=0.2)
Indirect Bilirubin: 0.3 mg/dL (ref 0.2–1.2)
Total Bilirubin: 0.4 mg/dL (ref 0.2–1.2)
Total Protein: 6.6 g/dL (ref 6.1–8.1)

## 2015-11-22 LAB — LIPID PANEL
Cholesterol: 154 mg/dL (ref 125–200)
HDL: 101 mg/dL (ref >=46)
LDL Cholesterol: 46 mg/dL (ref <130)
Total CHOL/HDL Ratio: 1.5 Ratio (ref <=5.0)
Triglycerides: 34 mg/dL (ref <150)
VLDL: 7 mg/dL (ref <30)

## 2015-11-23 ENCOUNTER — Telehealth: Payer: Self-pay

## 2015-11-23 ENCOUNTER — Ambulatory Visit (INDEPENDENT_AMBULATORY_CARE_PROVIDER_SITE_OTHER): Payer: Medicare Other | Admitting: Physician Assistant

## 2015-11-23 ENCOUNTER — Encounter: Payer: Self-pay | Admitting: Physician Assistant

## 2015-11-23 VITALS — BP 136/62 | HR 73 | Ht 62.0 in | Wt 104.4 lb

## 2015-11-23 DIAGNOSIS — I313 Pericardial effusion (noninflammatory): Secondary | ICD-10-CM

## 2015-11-23 DIAGNOSIS — I2584 Coronary atherosclerosis due to calcified coronary lesion: Secondary | ICD-10-CM

## 2015-11-23 DIAGNOSIS — I2510 Atherosclerotic heart disease of native coronary artery without angina pectoris: Secondary | ICD-10-CM | POA: Insufficient documentation

## 2015-11-23 DIAGNOSIS — I10 Essential (primary) hypertension: Secondary | ICD-10-CM

## 2015-11-23 DIAGNOSIS — R918 Other nonspecific abnormal finding of lung field: Secondary | ICD-10-CM | POA: Insufficient documentation

## 2015-11-23 DIAGNOSIS — I3139 Other pericardial effusion (noninflammatory): Secondary | ICD-10-CM

## 2015-11-23 DIAGNOSIS — I319 Disease of pericardium, unspecified: Secondary | ICD-10-CM | POA: Diagnosis not present

## 2015-11-23 DIAGNOSIS — I351 Nonrheumatic aortic (valve) insufficiency: Secondary | ICD-10-CM | POA: Insufficient documentation

## 2015-11-23 NOTE — Patient Instructions (Addendum)
Medication Instructions:  No changes. See medication list. Take colchicine 3 months. After 3 months of therapy, you may stop taking this medication. Labwork: None  Testing/Procedures: None Follow-Up: Dr. Radford Pax in August 2017 Any Other Special Instructions Will Be Listed Below (If Applicable). If you need a refill on your cardiac medications before your next appointment, please call your pharmacy.

## 2015-11-23 NOTE — Telephone Encounter (Signed)
Reviewed with Richardson Dopp, PA at [facility] today. Repeat ECHO ordered to be scheduled in 1 year.

## 2015-11-23 NOTE — Progress Notes (Signed)
Cardiology Office Note:    Date:  11/23/2015   ID:  Cynthia Vargas, DOB Nov 23, 1931, MRN [id number]  PCP:  Hoyt Koch, MD  Cardiologist:  Dr. Fransico Him   Electrophysiologist:  n/a  Referring MD: Hoyt Koch, *   Chief Complaint  Patient presents with  . Pericardial Effusion    Follow-up    History of Present Illness:     Cynthia Vargas is a 82 y.o. female with a hx of HTN, chest pain, GERD. Nuclear stress test in 5/15 was normal. Previous chest pain resolved with PPI. She was seen by Angelena Form, PA-C in 08/2015 for evaluation of CP. Nuclear stress test was low risk. She continued to complain of fatigue. She then underwent coronary CTA. This demonstrated calcium score of 101 and nonobstructive CAD. There was also a spiculated right upper lobe nodule noted. PET scan demonstrated hypermetabolic activity. She was seen by Dr. Servando Snare. Bronchoscopy with biopsy was negative for malignant findings. However it was felt that she has stage II B carcinoma of the lung. CT-guided needle biopsy was planned. She was seen in 4/17 with chest pain. Echocardiogram was obtained. This demonstrated moderate pericardial effusion. She was seen in follow-up by Dr. Radford Pax 11/01/15. Of note, the patient had not yet gone through with repeat biopsy to get definitive diagnosis. She was encouraged to follow-up with Dr. Servando Snare. She was placed on ibuprofen and colchicine to treat as acute pericarditis. She is brought back today for follow-up with repeat echocardiogram.   CRP 11/01/15:  <0.5 ESR 10/31/15:  5   Repeat Echo 11/22/15 demonstrated normal LV function, mild diastolic dysfunction, moderate aortic insufficiency, mild mitral regurgitation and trivial pericardial effusion.  Returns for FU.  She is feeling much better.  No further chest pain.  Denies significant dyspnea.  Denies syncope, orthopnea, PND, LE edema.  She still has not FU with Dr. Servando Snare.     Past Medical History    Diagnosis Date  . CALCANEAL FRACTURE, RIGHT 11/04/2007  . DILATION AND CURETTAGE, HX OF 11/04/2007  . KNEE PAIN, RIGHT, CHRONIC 03/19/2010  . OSTEOPOROSIS 05/02/2007  . PEPTIC ULCER DISEASE 05/02/2007  . Personal History of Other Diseases of Digestive Disease 11/04/2007  . Unspecified essential hypertension 05/02/2007  . GERD (gastroesophageal reflux disease)   . History of echocardiogram     Echo 4/17: EF 55-60%, normal wall motion, normal diastolic function, mild AI, moderate TR, small to moderate pericardial effusion  . Pericardial effusion 11/01/2015    Past Surgical History  Procedure Laterality Date  . Dilation and curettage of uterus    . Cholecystectomy    . Tonsillectomy    . Calcaneal fracture, right    . Orif hip fracture    . Esophagogastroduodenoscopy N/A 03/30/2013    Procedure: ESOPHAGOGASTRODUODENOSCOPY (EGD);  Surgeon: Jerene Bears, MD;  Location: [facility];  Service: Endoscopy;  Laterality: N/A;  . Video bronchoscopy with endobronchial navigation N/A 09/29/2015    Procedure: VIDEO BRONCHOSCOPY WITH ENDOBRONCHIAL NAVIGATION;  Surgeon: Grace Isaac, MD;  Location: [facility] OR;  Service: Thoracic;  Laterality: N/A;  . Lung biopsy N/A 09/29/2015    Procedure: LUNG BIOPSY;  Surgeon: Grace Isaac, MD;  Location: [place];  Service: Thoracic;  Laterality: N/A;  . Fuducial placement N/A 09/29/2015    Procedure:  PLACEMENT OF FUDUCIAL Marker times three;  Surgeon: Grace Isaac, MD;  Location: [place];  Service: Thoracic;  Laterality: N/A;    Current Medications: Outpatient Prescriptions Prior to  Visit  Medication Sig Dispense Refill  . acetaminophen (TYLENOL) 500 MG tablet Take 1,000 mg by mouth 2 (two) times daily as needed for moderate pain.     [name] amLODipine (NORVASC) 5 MG tablet Take 1 tablet (5 mg total) by mouth daily. 90 tablet 3  . atorvastatin (LIPITOR) 20 MG tablet Take 1 tablet (20 mg total) by mouth daily. 30 tablet 11  . colchicine 0.6 MG tablet Take 1 tablet (0.6  mg total) by mouth daily. 30 tablet 3  . nitroGLYCERIN (NITROSTAT) 0.4 MG SL tablet Place 0.4 mg under the tongue every 5 (five) minutes as needed for chest pain (x 3 doses).    . pantoprazole (PROTONIX) 40 MG tablet Take 40 mg by mouth daily.    . Wheat Dextrin (BENEFIBER DRINK MIX) PACK Take 1 scoop by mouth 3 (three) times daily.     [name] ibuprofen (ADVIL,MOTRIN) 600 MG tablet Take 1 tablet (600 mg total) by mouth 3 (three) times daily. (Patient not taking: Reported on 11/23/2015) 30 tablet 0   No facility-administered medications prior to visit.      Allergies:   Peanut-containing drug products   Social History   Social History  . Marital Status: Single    Spouse Name: N/A  . Number of Children: 1  . Years of Education: 16   Occupational History  . reitred    Social History Main Topics  . Smoking status: Never Smoker   . Smokeless tobacco: Never Used  . Alcohol Use: 0.6 oz/week    1 Glasses of wine per week  . Drug Use: No  . Sexual Activity: No   Other Topics Concern  . None   Social History Narrative   [employer]'s degrees in Development worker, community. married for 2 years then single . 1 son - Harvie Junior [place]. 2 daughters-put up for adoption. Lives alone and is independent in ADL's. works at [employer] until injured.     Family History:  The patient's family history includes Cancer in her father; Heart disease in her mother. There is no history of Diabetes, Hyperlipidemia, or Hypertension.   ROS:   Please see the history of present illness.    Review of Systems  Constitution: Positive for malaise/fatigue.  Gastrointestinal: Positive for diarrhea.  Neurological: Positive for dizziness.   All other systems reviewed and are negative.   Physical Exam:    VS:  BP 136/62 mmHg  Pulse 73  Ht 5' 2"  (1.575 m)  Wt 104 lb 6.4 oz (47.356 kg)  BMI 19.09 kg/m2   GEN: Well nourished, well developed, in no acute distress HEENT: normal Neck: no JVD, no masses Cardiac: Normal S1/S2, RRR; no  murmurs, rubs, or gallops, no edema;     Respiratory:  clear to auscultation bilaterally; no wheezing, rhonchi or rales GI: soft, nontender, nondistended MS: no deformity or atrophy Skin: warm and dry Neuro: No focal deficits  Psych: Alert and oriented x 3, normal affect  Wt Readings from Last 3 Encounters:  11/23/15 104 lb 6.4 oz (47.356 kg)  11/01/15 106 lb 3.2 oz (48.172 kg)  10/06/15 106 lb 6.4 oz (48.263 kg)      Studies/Labs Reviewed:     EKG:  EKG is  ordered today.  The ekg ordered today demonstrates NSR, HR 73, normal axis, QTc 467 ms  Recent Labs: 08/28/2015: TSH 5.76* 09/28/2015: BUN 12; Creatinine, Ser 0.82; Hemoglobin 13.0; Platelets 231; Potassium 3.6; Sodium 141 11/22/2015: ALT 17   Recent Lipid Panel  Component Value Date/Time   CHOL 154 11/22/2015 1131   TRIG 34 11/22/2015 1131   HDL 101 11/22/2015 1131   CHOLHDL 1.5 11/22/2015 1131   VLDL 7 11/22/2015 1131   LDLCALC 46 11/22/2015 1131   LDLDIRECT 115.1 03/19/2010 1406    Additional studies/ records that were reviewed today include:   Echo 11/22/15  EF 55-60%, normal wall motion, grade 1 diastolic dysfunction, moderate AI, mild MR, moderate to severe LAE, normal RVSF, mild TR, PASP 28 mmHg, trivial pericardial effusion   Echo 4/17:  EF 55-60%, normal wall motion, normal diastolic function, mild AI, moderate TR, small to moderate pericardial effusion  CXR 09/29/15 No pneumothorax post lung bx  Coronary CTA 09/14/15 Calcium score 101 LM stenosis LAD proximal mild stenosis LCx okay RCA no plaque or stenosis 2.8 x 1.5 cm RUL nodule-spiculated  Myoview 08/14/15 Perfusion Summary Defect 1: There is a medium defect of mild severity present in the basal anteroseptal, basal inferoseptal, mid anteroseptal and mid inferoseptal location. The defect is non-reversible. Overall Study Impression Myocardial perfusion is abnormal. This is a low risk study. Overall left ventricular systolic function was normal. LV  cavity size is normal. Nuclear stress EF: 77%. The left ventricular ejection fraction is hyperdynamic (>65%). A prior study was conducted on 10/28/2013.There are no significant changes in comparison to the prior study.  Echo 4/15 EF 55-60%, normal wall motion, normal diastolic function, mild AI, moderate TR, trivial pericardial effusion   ASSESSMENT:     1. Pericardial effusion   2. Coronary artery disease involving native coronary artery of native heart without angina pectoris   3. Lung mass   4. Essential hypertension   5. Aortic insufficiency     PLAN:     In order of problems listed above:  1. Pericardial effusion - Recent echo with smaller sized effusion - now called trivial.  She is feeling much better. She is now off of ibuprofen. She remains on colchicine. Although her sedimentation rate and CRP were normal, with reduction in size of her effusion on colchicine, I assume she had pericarditis. We'll continue colchicine for a total of 3 months, then discontinue. Follow-up with Dr. Radford Pax in August as planned.  2. CAD -  Nonobstructive on CTA. She cannot take ASA. Continue statin, Amlodipine. I discussed the results of her recent lipid panel with her.    3. Lung mass - I have encouraged her to follow-up with Dr. Servando Snare.  4. HTN - Controlled  5. Aortic insufficiency - I discussed the results of her recent echocardiogram. Repeat echo 1 year.    Medication Adjustments/Labs and Tests Ordered: Current medicines are reviewed at length with the patient today.  Concerns regarding medicines are outlined above.  Medication changes, Labs and Tests ordered today are outlined in the Patient Instructions noted below. Patient Instructions  Medication Instructions:  No changes. See medication list. Take colchicine 3 months. After 3 months of therapy, you may stop taking this medication. Labwork: None  Testing/Procedures: None Follow-Up: Dr. Radford Pax in August 2017 Any Other Special  Instructions Will Be Listed Below (If Applicable). If you need a refill on your cardiac medications before your next appointment, please call your pharmacy.   Signed, Richardson Dopp, PA-C  11/23/2015 6:02 PM    [facility] [place], [place], [place]  [address] Phone: [phone]; Fax: [phone]

## 2015-11-23 NOTE — Telephone Encounter (Signed)
-----   Message from Sueanne Margarita, MD sent at 11/22/2015  9:33 PM EDT ----- Echo showed normal LVF with increased stiffness of heart muscle, moderate AR, mild MR, moderately dilated LA, mild TR, trivial pericardia effusion - repeat echo in 1 year for AR

## 2015-11-24 ENCOUNTER — Other Ambulatory Visit: Payer: Medicare Other

## 2015-11-28 ENCOUNTER — Ambulatory Visit: Payer: Medicare Other | Admitting: Cardiology

## 2016-01-22 ENCOUNTER — Encounter: Payer: Self-pay | Admitting: Cardiology

## 2016-02-08 ENCOUNTER — Encounter (INDEPENDENT_AMBULATORY_CARE_PROVIDER_SITE_OTHER): Payer: Self-pay

## 2016-02-08 ENCOUNTER — Encounter: Payer: Self-pay | Admitting: Cardiology

## 2016-02-08 ENCOUNTER — Ambulatory Visit (INDEPENDENT_AMBULATORY_CARE_PROVIDER_SITE_OTHER): Payer: Medicare Other | Admitting: Cardiology

## 2016-02-08 VITALS — BP 128/78 | HR 72 | Ht 62.0 in | Wt 105.2 lb

## 2016-02-08 DIAGNOSIS — I2510 Atherosclerotic heart disease of native coronary artery without angina pectoris: Secondary | ICD-10-CM

## 2016-02-08 DIAGNOSIS — I309 Acute pericarditis, unspecified: Secondary | ICD-10-CM

## 2016-02-08 DIAGNOSIS — I2583 Coronary atherosclerosis due to lipid rich plaque: Secondary | ICD-10-CM

## 2016-02-08 DIAGNOSIS — I10 Essential (primary) hypertension: Secondary | ICD-10-CM

## 2016-02-08 DIAGNOSIS — I351 Nonrheumatic aortic (valve) insufficiency: Secondary | ICD-10-CM

## 2016-02-08 DIAGNOSIS — I2584 Coronary atherosclerosis due to calcified coronary lesion: Secondary | ICD-10-CM | POA: Diagnosis not present

## 2016-02-08 DIAGNOSIS — E785 Hyperlipidemia, unspecified: Secondary | ICD-10-CM | POA: Insufficient documentation

## 2016-02-08 HISTORY — DX: Hyperlipidemia, unspecified: E78.5

## 2016-02-08 NOTE — Progress Notes (Signed)
Cardiology Office Note    Date:  02/08/2016   ID:  [name], [name] 03/01/1932, MRN [id number]  PCP:  Hoyt Koch, MD  Cardiologist:  Fransico Him, MD   No chief complaint on file.   History of Present Illness:  Cynthia Vargas is a 82 y.o. female with a hx of HTN, nonobstructive CAD, stage IIB carcinoma of the lung, mild to moderate AI and moderate pericardial effusion who I saw for chest pain in May. I treated her for acute pericarditis with NSAIDs and Colchicine.  CRP and sed rate came back normal.  She was seen back by Richardson Dopp, PA and repeat echo showed normal LVF with moderate AI, mild MR and trivial PE.  She was feeling much better with no CP or SOB. Despite normal CRP and sed rate, it was felt that she had had acute pericarditis given signficant response to NSAIDs and colchicine.  Her colchicine was continued for a total of 3 months.  She is now back for followup.  She denies any chest pain, SOB, DOE, LE edema, palpitations or dizziness.  She has not followed back up with Dr. Servando Snare.   Past Medical History:  Diagnosis Date  . Acute pericarditis 11/01/2015   with moderate pericardial effusion by echo 09/2015 - signficantly improved with trivial effusion on repeat echo after NSADIs and colchicine  . Benign essential HTN 05/02/2007  . CALCANEAL FRACTURE, RIGHT 11/04/2007  . DILATION AND CURETTAGE, HX OF 11/04/2007  . GERD (gastroesophageal reflux disease)   . Hyperlipidemia LDL goal <70 02/08/2016  . KNEE PAIN, RIGHT, CHRONIC 03/19/2010  . OSTEOPOROSIS 05/02/2007  . PEPTIC ULCER DISEASE 05/02/2007  . Personal History of Other Diseases of Digestive Disease 11/04/2007    Past Surgical History:  Procedure Laterality Date  . Calcaneal fracture, right    . CHOLECYSTECTOMY    . DILATION AND CURETTAGE OF UTERUS    . ESOPHAGOGASTRODUODENOSCOPY N/A 03/30/2013   Procedure: ESOPHAGOGASTRODUODENOSCOPY (EGD);  Surgeon: Jerene Bears, MD;  Location: [facility];  Service:  Endoscopy;  Laterality: N/A;  . FUDUCIAL PLACEMENT N/A 09/29/2015   Procedure:  PLACEMENT OF FUDUCIAL Marker times three;  Surgeon: Grace Isaac, MD;  Location: [place];  Service: Thoracic;  Laterality: N/A;  . LUNG BIOPSY N/A 09/29/2015   Procedure: LUNG BIOPSY;  Surgeon: Grace Isaac, MD;  Location: [place];  Service: Thoracic;  Laterality: N/A;  . ORIF HIP FRACTURE    . TONSILLECTOMY    . VIDEO BRONCHOSCOPY WITH ENDOBRONCHIAL NAVIGATION N/A 09/29/2015   Procedure: VIDEO BRONCHOSCOPY WITH ENDOBRONCHIAL NAVIGATION;  Surgeon: Grace Isaac, MD;  Location: [facility];  Service: Thoracic;  Laterality: N/A;    Current Medications: Outpatient Medications Prior to Visit  Medication Sig Dispense Refill  . acetaminophen (TYLENOL) 500 MG tablet Take 1,000 mg by mouth 2 (two) times daily as needed for moderate pain.     [name] amLODipine (NORVASC) 5 MG tablet Take 1 tablet (5 mg total) by mouth daily. 90 tablet 3  . atorvastatin (LIPITOR) 20 MG tablet Take 1 tablet (20 mg total) by mouth daily. 30 tablet 11  . colchicine 0.6 MG tablet Take 1 tablet (0.6 mg total) by mouth daily. 30 tablet 3  . nitroGLYCERIN (NITROSTAT) 0.4 MG SL tablet Place 0.4 mg under the tongue every 5 (five) minutes as needed for chest pain (x 3 doses).    . pantoprazole (PROTONIX) 40 MG tablet Take 40 mg by mouth daily.    . Wheat  Dextrin (BENEFIBER DRINK MIX) PACK Take 1 scoop by mouth 3 (three) times daily.      No facility-administered medications prior to visit.      Allergies:   Peanut-containing drug products   Social History   Social History  . Marital status: Single    Spouse name: N/A  . Number of children: 1  . Years of education: 55   Occupational History  . reitred    Social History Main Topics  . Smoking status: Never Smoker  . Smokeless tobacco: Never Used  . Alcohol use 0.6 oz/week    1 Glasses of wine per week  . Drug use: No  . Sexual activity: No   Other Topics Concern  . None   Social  History Narrative   [employer]'s degrees in Development worker, community. married for 2 years then single . 1 son - Cynthia Vargas. 2 daughters-put up for adoption. Lives alone and is independent in ADL's. works at [employer] until injured.     Family History:  The patient's family history includes Cancer in her father; Heart disease in her mother.   ROS:   Please see the history of present illness.    ROS All other systems reviewed and are negative.   PHYSICAL EXAM:   VS:  BP 128/78   Pulse 72   Ht '5\' 2"'$  (1.575 m)   Wt 105 lb 3.2 oz (47.7 kg)   SpO2 98%   BMI 19.24 kg/m    GEN: Well nourished, well developed, in no acute distress  HEENT: normal  Neck: no JVD, carotid bruits, or masses Cardiac: RRR; no murmurs, rubs, or gallops,no edema.  Intact distal pulses bilaterally.  Respiratory:  clear to auscultation bilaterally, normal work of breathing GI: soft, nontender, nondistended, + BS MS: no deformity or atrophy  Skin: warm and dry, no rash Neuro:  Alert and Oriented x 3, Strength and sensation are intact Psych: euthymic mood, full affect  Wt Readings from Last 3 Encounters:  02/08/16 105 lb 3.2 oz (47.7 kg)  11/23/15 104 lb 6.4 oz (47.4 kg)  11/01/15 106 lb 3.2 oz (48.2 kg)      Studies/Labs Reviewed:   EKG:  EKG is not ordered today.    Recent Labs: 08/28/2015: TSH 5.76 09/28/2015: BUN 12; Creatinine, Ser 0.82; Hemoglobin 13.0; Platelets 231; Potassium 3.6; Sodium 141 11/22/2015: ALT 17   Lipid Panel    Component Value Date/Time   CHOL 154 11/22/2015 1131   TRIG 34 11/22/2015 1131   HDL 101 11/22/2015 1131   CHOLHDL 1.5 11/22/2015 1131   VLDL 7 11/22/2015 1131   LDLCALC 46 11/22/2015 1131   LDLDIRECT 115.1 03/19/2010 1406    Additional studies/ records that were reviewed today include:  echo    ASSESSMENT:    1. Aortic insufficiency   2. Acute pericarditis   3. Coronary artery disease due to lipid rich plaque   4. Essential hypertension   5. Hyperlipidemia LDL goal  <70      PLAN:  In order of problems listed above:  1. Moderate AI by echo 10/2015 - repeat echo in 1 year to assess stability 2. Acute pericarditis with moderate effusion - nearly resolved on echo 10/2015.  She has not had any further CP or SOB.  CRP and sed rate were normal.  I have instructed her to stop her colchicine. 3. ASCAD - nonobstructive with no angina. Continue statin.  She says that she has significant GERD and does not want to take  ASA. 4. HTN - BP controlled.  Continue amlodipine. 5. Hyperlipidemia on statin.  LDL at goal at 46.    She has not yet followed up with Dr. Servando Snare for her lung mass.  I will make an appt with him for her an encouraged her to keep the appt.     Medication Adjustments/Labs and Tests Ordered: Current medicines are reviewed at length with the patient today.  Concerns regarding medicines are outlined above.  Medication changes, Labs and Tests ordered today are listed in the Patient Instructions below.  There are no Patient Instructions on file for this visit.   Signed, Fransico Him, MD  02/08/2016 1:55 PM    [facility] [place], [place], [place]  [address] Phone: [phone]; Fax: [phone]

## 2016-02-08 NOTE — Patient Instructions (Addendum)
Medication Instructions:  1) STOP COLCHICINE  Labwork: None  Testing/Procedures: Your physician has requested that you have an echocardiogram in May, 2018. Echocardiography is a painless test that uses sound waves to create images of your heart. It provides your doctor with information about the size and shape of your heart and how well your heart's chambers and valves are working. This procedure takes approximately one hour. There are no restrictions for this procedure.  Follow-Up: Your physician recommends that you schedule a follow-up appointment with Dr. Servando Snare ASAP.  Your physician wants you to follow-up in: 1 year with Dr. Radford Pax. You will receive a reminder letter in the mail two months in advance. If you don't receive a letter, please call our office to schedule the follow-up appointment.   Any Other Special Instructions Will Be Listed Below (If Applicable).     If you need a refill on your cardiac medications before your next appointment, please call your pharmacy.

## 2016-04-01 ENCOUNTER — Other Ambulatory Visit: Payer: Self-pay | Admitting: *Deleted

## 2016-04-11 ENCOUNTER — Other Ambulatory Visit: Payer: Self-pay | Admitting: Internal Medicine

## 2016-04-15 DIAGNOSIS — H25013 Cortical age-related cataract, bilateral: Secondary | ICD-10-CM | POA: Diagnosis not present

## 2016-04-15 DIAGNOSIS — H524 Presbyopia: Secondary | ICD-10-CM | POA: Diagnosis not present

## 2016-04-15 DIAGNOSIS — H2513 Age-related nuclear cataract, bilateral: Secondary | ICD-10-CM | POA: Diagnosis not present

## 2016-04-29 ENCOUNTER — Other Ambulatory Visit: Payer: Self-pay | Admitting: Cardiology

## 2016-05-14 DIAGNOSIS — H25012 Cortical age-related cataract, left eye: Secondary | ICD-10-CM | POA: Diagnosis not present

## 2016-05-14 DIAGNOSIS — H2512 Age-related nuclear cataract, left eye: Secondary | ICD-10-CM | POA: Diagnosis not present

## 2016-05-14 DIAGNOSIS — H25812 Combined forms of age-related cataract, left eye: Secondary | ICD-10-CM | POA: Diagnosis not present

## 2016-06-14 ENCOUNTER — Telehealth: Payer: Self-pay

## 2016-06-14 NOTE — Telephone Encounter (Signed)
Call transferred from refill department. The pt called into the office to question if we could send in a prescription to treat her chest pressure. The pt states that for the past week she has had off and on chest pressure and fatigue. The pressure is in the center of her chest and is not effected by movement or inspiration. The pt has not tried any OTC medications. The pt denies any other symptoms of nausea or SOB.  The pt would like to know if she can get a Rx called into the pharmacy to treat her symptoms.  I advised the pt that she will most likely need an appointment for further evaluation.  I will review schedules for availability and call her back.   Left message on machine for pt to contact the office to arrange appointment today at [facility] with an extender.

## 2016-06-17 NOTE — Telephone Encounter (Signed)
Left message on machine for pt to contact the office to schedule a follow-up appointment due to symptoms.

## 2016-06-18 NOTE — Telephone Encounter (Signed)
Patient has been scheduled 12/26. Called patient to reschedule appointment sooner given symptoms. Left message to call back.

## 2016-06-19 NOTE — Telephone Encounter (Signed)
Patient refuses earlier appointment - she states her granddaughter is coming for a visit. She also states she feels better. She will keep appointment for 12/26. She understands to seek immediate medical attention if symptoms worsen again. She was grateful for assistance.

## 2016-06-19 NOTE — Telephone Encounter (Signed)
Left message to call back  

## 2016-06-25 ENCOUNTER — Ambulatory Visit (INDEPENDENT_AMBULATORY_CARE_PROVIDER_SITE_OTHER): Payer: Medicare Other | Admitting: Physician Assistant

## 2016-06-25 ENCOUNTER — Encounter (INDEPENDENT_AMBULATORY_CARE_PROVIDER_SITE_OTHER): Payer: Self-pay

## 2016-06-25 ENCOUNTER — Encounter: Payer: Self-pay | Admitting: Physician Assistant

## 2016-06-25 VITALS — BP 140/82 | HR 76 | Ht 62.0 in | Wt 109.0 lb

## 2016-06-25 DIAGNOSIS — I2510 Atherosclerotic heart disease of native coronary artery without angina pectoris: Secondary | ICD-10-CM | POA: Diagnosis not present

## 2016-06-25 DIAGNOSIS — I10 Essential (primary) hypertension: Secondary | ICD-10-CM

## 2016-06-25 DIAGNOSIS — I319 Disease of pericardium, unspecified: Secondary | ICD-10-CM | POA: Diagnosis not present

## 2016-06-25 DIAGNOSIS — R072 Precordial pain: Secondary | ICD-10-CM

## 2016-06-25 DIAGNOSIS — R002 Palpitations: Secondary | ICD-10-CM | POA: Diagnosis not present

## 2016-06-25 DIAGNOSIS — R911 Solitary pulmonary nodule: Secondary | ICD-10-CM

## 2016-06-25 LAB — CBC
HCT: 38.2 % (ref 35.0–45.0)
Hemoglobin: 12.2 g/dL (ref 11.7–15.5)
MCH: 29 pg (ref 27.0–33.0)
MCHC: 31.9 g/dL — ABNORMAL LOW (ref 32.0–36.0)
MCV: 91 fL (ref 80.0–100.0)
MPV: 10 fL (ref 7.5–12.5)
Platelets: 251 10*3/uL (ref 140–400)
RBC: 4.2 MIL/uL (ref 3.80–5.10)
RDW: 14.5 % (ref 11.0–15.0)
WBC: 3.7 10*3/uL — ABNORMAL LOW (ref 3.8–10.8)

## 2016-06-25 LAB — BASIC METABOLIC PANEL
BUN: 16 mg/dL (ref 7–25)
CO2: 27 mmol/L (ref 20–31)
Calcium: 8.5 mg/dL — ABNORMAL LOW (ref 8.6–10.4)
Chloride: 106 mmol/L (ref 98–110)
Creat: 0.95 mg/dL — ABNORMAL HIGH (ref 0.60–0.88)
Glucose, Bld: 84 mg/dL (ref 65–99)
Potassium: 4.3 mmol/L (ref 3.5–5.3)
Sodium: 139 mmol/L (ref 135–146)

## 2016-06-25 LAB — TSH: TSH: 7.37 mIU/L — ABNORMAL HIGH

## 2016-06-25 NOTE — Progress Notes (Signed)
Cardiology Office Note    Date:  06/25/2016  ID:  Princes, Finger October 24, 1931, MRN [id number] PCP:  Hoyt Koch, MD  Cardiologist:  Dr. Radford Pax   Chief Complaint: chest pain  History of Present Illness:  HAROLD MATTES is a 82 y.o. female with history of HTN, chest pain, GERD nonobstructive CAD, possible stage IIB carcinoma of the lung, mild to moderate AI and moderate pericardial effusion, pericarditis who presents for f/u chest pain.  Nuclear stress test in 5/15 was normal. Previous chest pain resolved with PPI. She was seen by Angelena Form, PA-C in 08/2015 for evaluation of CP. Nuclear stress test was low risk. She continued to complain of fatigue. She then underwent coronary CTA. This demonstrated calcium score 54th percentile and nonobstructive CAD. She cannot take aspirin due to acid reflux per her report. There was also a spiculated right upper lobe nodule noted with abnormal PET scan. She was subsequently found to have possible stage IIB carcinoma of the lung but has elected not to follow back up for further evaluation. In 09/2015 she returned for recurrent chest pain and was treated with Protonix and Norvasc. 2D echo 10/2015 showed mild-moderate pericardial effusion - she was subsequently treated as acute pericarditis. CRP and ESR were normal. She was seen back by Richardson Dopp, PA-C 11/23/15 and repeat echo showed normal LVF, grade 1 DD, moderate AI, mild MR, mod-severe LAE, trivial pericardial effusion. She was feeling much better with no CP or SOB. Her colchicine was continued for a total of 3 months.    She presents back to clinic having had 2 more episodes of chest discomfort, each lasting up to 2 days. The last one was on 05/31/16. She also feels like she had a sensation of heart fluttering while this was happening. No SOB, LEE, orthopnea, syncope. Her chest felt "sore" to the touch during these episodes. The pain is not worse with inspiration, certain positions or  movement. She takes her dog on 2 long walks a day and says the exertion has actually made her feel better. She has not had another episode since the last one earlier this month.   Past Medical History:  Diagnosis Date  . Acute pericarditis 11/01/2015   a. moderate pericardial effusion by echo 09/2015 - signficantly improved with trivial effusion on repeat echo after NSAIDS and colchicine 10/2015.  [name] Aortic insufficiency   . CALCANEAL FRACTURE, RIGHT 11/04/2007  . Essential hypertension   . GERD (gastroesophageal reflux disease)   . Hyperlipidemia LDL goal <70 02/08/2016  . KNEE PAIN, RIGHT, CHRONIC 03/19/2010  . Lung nodule    a. found on CT 2017 - possible stage IIB carcinoma.  . Mitral regurgitation   . Nonobstructive atherosclerosis of coronary artery   . OSTEOPOROSIS 05/02/2007  . PEPTIC ULCER DISEASE 05/02/2007    Past Surgical History:  Procedure Laterality Date  . Calcaneal fracture, right    . CHOLECYSTECTOMY    . DILATION AND CURETTAGE OF UTERUS    . ESOPHAGOGASTRODUODENOSCOPY N/A 03/30/2013   Procedure: ESOPHAGOGASTRODUODENOSCOPY (EGD);  Surgeon: Jerene Bears, MD;  Location: [facility];  Service: Endoscopy;  Laterality: N/A;  . FUDUCIAL PLACEMENT N/A 09/29/2015   Procedure:  PLACEMENT OF FUDUCIAL Marker times three;  Surgeon: Grace Isaac, MD;  Location: [facility];  Service: Thoracic;  Laterality: N/A;  . LUNG BIOPSY N/A 09/29/2015   Procedure: LUNG BIOPSY;  Surgeon: Grace Isaac, MD;  Location: [facility];  Service: Thoracic;  Laterality: N/A;  .  ORIF HIP FRACTURE    . TONSILLECTOMY    . VIDEO BRONCHOSCOPY WITH ENDOBRONCHIAL NAVIGATION N/A 09/29/2015   Procedure: VIDEO BRONCHOSCOPY WITH ENDOBRONCHIAL NAVIGATION;  Surgeon: Grace Isaac, MD;  Location: [facility];  Service: Thoracic;  Laterality: N/A;    Current Medications: Current Outpatient Prescriptions  Medication Sig Dispense Refill  . acetaminophen (TYLENOL) 500 MG tablet Take 1,000 mg by mouth 2 (two) times daily as  needed for moderate pain.     [name] amLODipine (NORVASC) 5 MG tablet Take 1 tablet (5 mg total) by mouth daily. 90 tablet 3  . atorvastatin (LIPITOR) 20 MG tablet Take 1 tablet (20 mg total) by mouth daily. 30 tablet 11  . nitroGLYCERIN (NITROSTAT) 0.4 MG SL tablet Place 0.4 mg under the tongue every 5 (five) minutes as needed for chest pain (x 3 doses).    . pantoprazole (PROTONIX) 40 MG tablet Take 40 mg by mouth daily.    . pantoprazole (PROTONIX) 40 MG tablet TAKE 1 TABLET BY MOUTH ONCE DAILY 90 tablet 0  . Wheat Dextrin (BENEFIBER DRINK MIX) PACK Take 1 scoop by mouth 3 (three) times daily.      No current facility-administered medications for this visit.      Allergies:   Peanut-containing drug products   Social History   Social History  . Marital status: Single    Spouse name: N/A  . Number of children: 1  . Years of education: 50   Occupational History  . reitred    Social History Main Topics  . Smoking status: Never Smoker  . Smokeless tobacco: Never Used  . Alcohol use 0.6 oz/week    1 Glasses of wine per week  . Drug use: No  . Sexual activity: No   Other Topics Concern  . None   Social History Narrative   [employer] degrees in Development worker, community. married for 2 years then single . 1 son - Harvie Junior [place]. 2 daughters-put up for adoption. Lives alone and is independent in ADL's. works at [employer] until injured.     Family History:  The patient's family history includes Cancer in her father; Heart disease in her mother.   ROS:   Please see the history of present illness.  All other systems are reviewed and otherwise negative.    PHYSICAL EXAM:   VS:  BP 140/82   Pulse 76   Ht 5' 2"  (1.575 m)   Wt 109 lb (49.4 kg)   BMI 19.94 kg/m   BMI: Body mass index is 19.94 kg/m. GEN: Well nourished, well developed very thin WF in no acute distress  HEENT: normocephalic, atraumatic Neck: no JVD, carotid bruits, or masses Cardiac: RRR; no murmurs, rubs, or gallops, no edema    Respiratory:  clear to auscultation bilaterally, normal work of breathing GI: soft, nontender, nondistended, + BS MS: no deformity or atrophy  Skin: warm and dry, no rash Neuro:  Alert and Oriented x 3, Strength and sensation are intact, follows commands, frequent lip movements Psych: euthymic mood, somewhat detached affect  Wt Readings from Last 3 Encounters:  06/25/16 109 lb (49.4 kg)  02/08/16 105 lb 3.2 oz (47.7 kg)  11/23/15 104 lb 6.4 oz (47.4 kg)      Studies/Labs Reviewed:   EKG:  EKG was ordered today and personally reviewed by me and demonstrates NSR 77bpm, mildly low voltage QRS, nonspecific ST-T changes, appears similar to prior tracing in 10/2015.  Recent Labs: 08/28/2015: TSH 5.76 09/28/2015: BUN 12; Creatinine, Ser  0.82; Hemoglobin 13.0; Platelets 231; Potassium 3.6; Sodium 141 11/22/2015: ALT 17   Lipid Panel    Component Value Date/Time   CHOL 154 11/22/2015 1131   TRIG 34 11/22/2015 1131   HDL 101 11/22/2015 1131   CHOLHDL 1.5 11/22/2015 1131   VLDL 7 11/22/2015 1131   LDLCALC 46 11/22/2015 1131   LDLDIRECT 115.1 03/19/2010 1406    Additional studies/ records that were reviewed today include: Summarized above.    ASSESSMENT & PLAN:   1. Precordial pain/palpitations - symptoms are somewhat atypical, even for pericarditis. Although she states quality of discomfort was somewhat similar to the spring, she also reports this episode was associated with palpitations that lasted for the duration of the chest discomfort. Will start with 30 day event monitor. Will also check basic labs including repeat ESR and CRP. Given her history of significant acid reflux will hold off on resuming empiric NSAIDs as she has not had any recurrent symptoms in over 3 weeks. I asked her to please keep [place] informed if her symptoms recur, which may prompt another trial of NSAIDS + colchicine plus a limited 2D echo to re-eval effusion. She is not tachycardic, hypotensive, or volume  overloaded and does not have JVD or a rub on exam. 2. Pericarditis - see above. 3. Essential HTN - continue present regimen. 4. CAD - continue risk factor modification including statin. She states she cannot take aspirin. This pain does not sound anginal in nature. She sees improvement in symptoms when she is physically active. 5. Lung nodule concerning for lung cancer - the patient has declined further workup at this time.  Disposition: F/u with Dr. Betsy Pries team after event monitor.   Medication Adjustments/Labs and Tests Ordered: Current medicines are reviewed at length with the patient today.  Concerns regarding medicines are outlined above. Medication changes, Labs and Tests ordered today are summarized above and listed in the Patient Instructions accessible in Encounters.   Raechel Ache PA-C  06/25/2016 1:54 PM    [facility] [address], [place], [place]  [address] Phone: [phone]; Fax: [phone]

## 2016-06-25 NOTE — Patient Instructions (Signed)
Medication Instructions:  Your physician recommends that you continue on your current medications as directed. Please refer to the Current Medication list given to you today.   Labwork: TODAY:  BMET, SED RATE, CRP, TSH, & CBC  Testing/Procedures: Your physician has recommended that you wear an event monitor. Event monitors are medical devices that record the heart's electrical activity. Doctors most often [place] these monitors to diagnose arrhythmias. Arrhythmias are problems with the speed or rhythm of the heartbeat. The monitor is a small, portable device. You can wear one while you do your normal daily activities. This is usually used to diagnose what is causing palpitations/syncope (passing out).    Follow-Up: Your physician recommends that you schedule a follow-up appointment in: AFTER THE HEART MONITOR IS TURNED IN WITH DR. Radford Pax OR SOMEONE FROM HER CARE TEAM   Any Other Special Instructions Will Be Listed Below (If Applicable).  Cardiac Event Monitoring A cardiac event monitor is a small recording device used to help detect abnormal heart rhythms (arrhythmias). The monitor is used to record heart rhythm when noticeable symptoms such as the following occur:  Fast heartbeats (palpitations), such as heart racing or fluttering.  Dizziness.  Fainting or light-headedness.  Unexplained weakness. The monitor is wired to two electrodes placed on your chest. Electrodes are flat, sticky disks that attach to your skin. The monitor can be worn for up to 30 days. You will wear the monitor at all times, except when bathing. How to use your cardiac event monitor A technician will prepare your chest for the electrode placement. The technician will show you how to place the electrodes, how to work the monitor, and how to replace the batteries. Take time to practice using the monitor before you leave the office. Make sure you understand how to send the information from the monitor to your health care  provider. This requires a telephone with a landline, not a cell phone. You need to:  Wear your monitor at all times, except when you are in water:  Do not get the monitor wet.  Take the monitor off when bathing. Do not swim or use a hot tub with it on.  Keep your skin clean. Do not put body lotion or moisturizer on your chest.  Change the electrodes daily or any time they stop sticking to your skin. You might need to use tape to keep them on.  It is possible that your skin under the electrodes could become irritated. To keep this from happening, try to put the electrodes in slightly different places on your chest. However, they must remain in the area under your left breast and in the upper right section of your chest.  Make sure the monitor is safely clipped to your clothing or in a location close to your body that your health care provider recommends.  Press the button to record when you feel symptoms of heart trouble, such as dizziness, weakness, light-headedness, palpitations, thumping, shortness of breath, unexplained weakness, or a fluttering or racing heart. The monitor is always on and records what happened slightly before you pressed the button, so do not worry about being too late to get good information.  Keep a diary of your activities, such as walking, doing chores, and taking medicine. It is especially important to note what you were doing when you pushed the button to record your symptoms. This will help your health care provider determine what might be contributing to your symptoms. The information stored in your monitor will be  reviewed by your health care provider alongside your diary entries.  Send the recorded information as recommended by your health care provider. It is important to understand that it will take some time for your health care provider to process the results.  Change the batteries as recommended by your health care provider. Get help right away if:  You  have chest pain.  You have extreme difficulty breathing or shortness of breath.  You develop a very fast heartbeat that persists.  You develop dizziness that does not go away.  You faint or constantly feel you are about to faint. This information is not intended to replace advice given to you by your health care provider. Make sure you discuss any questions you have with your health care provider. Document Released: 03/26/2008 Document Revised: 11/23/2015 Document Reviewed: 12/14/2012 Elsevier Interactive Patient Education  2017 [employer].    If you need a refill on your cardiac medications before your next appointment, please call your pharmacy.

## 2016-06-26 ENCOUNTER — Telehealth: Payer: Self-pay | Admitting: Physician Assistant

## 2016-06-26 LAB — SEDIMENTATION RATE: Sed Rate: 4 mm/hr (ref 0–30)

## 2016-06-26 LAB — C-REACTIVE PROTEIN: CRP: 0.3 mg/L

## 2016-06-26 NOTE — Telephone Encounter (Signed)
-----   Message from Charlie Pitter, [place] sent at 06/26/2016  6:33 AM EST ----- Please let patient know labs do not show any acute abnormalities but 2 abnormal findings she needs to f/u PCP about - thyroid function is mildly abnormal and WBC is decreased. Both of these were seen on prior labs as well. Keep plan as discussed. Dayna Dunn PA-C

## 2016-06-26 NOTE — Telephone Encounter (Signed)
Pt aware of her lab results and she was advised to contact her pcp re: tsh & wbc. Pt verbalized understanding. Copy of labs fwd to Dr. Sharlet Salina

## 2016-06-26 NOTE — Telephone Encounter (Signed)
New message    Pt verbalized that she I calling for lab results

## 2016-06-26 NOTE — Telephone Encounter (Signed)
Returned pts call and had to leave another message for pt to call back.

## 2016-06-26 NOTE — Telephone Encounter (Signed)
Follow up   Returning call for rn

## 2016-07-02 ENCOUNTER — Ambulatory Visit (INDEPENDENT_AMBULATORY_CARE_PROVIDER_SITE_OTHER): Payer: Medicare Other

## 2016-07-02 DIAGNOSIS — R002 Palpitations: Secondary | ICD-10-CM | POA: Diagnosis not present

## 2016-07-04 ENCOUNTER — Encounter: Payer: Self-pay | Admitting: Internal Medicine

## 2016-07-04 ENCOUNTER — Ambulatory Visit (INDEPENDENT_AMBULATORY_CARE_PROVIDER_SITE_OTHER): Payer: Medicare Other | Admitting: Internal Medicine

## 2016-07-04 ENCOUNTER — Other Ambulatory Visit (INDEPENDENT_AMBULATORY_CARE_PROVIDER_SITE_OTHER): Payer: Medicare Other

## 2016-07-04 VITALS — BP 142/80 | HR 69 | Temp 97.8°F | Resp 16 | Ht 62.0 in | Wt 105.2 lb

## 2016-07-04 DIAGNOSIS — R7989 Other specified abnormal findings of blood chemistry: Secondary | ICD-10-CM

## 2016-07-04 DIAGNOSIS — Z23 Encounter for immunization: Secondary | ICD-10-CM | POA: Diagnosis not present

## 2016-07-04 DIAGNOSIS — R946 Abnormal results of thyroid function studies: Secondary | ICD-10-CM | POA: Diagnosis not present

## 2016-07-04 DIAGNOSIS — D729 Disorder of white blood cells, unspecified: Secondary | ICD-10-CM | POA: Insufficient documentation

## 2016-07-04 LAB — T4, FREE: Free T4: 0.86 ng/dL (ref 0.60–1.60)

## 2016-07-04 NOTE — Progress Notes (Signed)
Pre visit review using our clinic review tool, if applicable. No additional management support is needed unless otherwise documented below in the visit note. 

## 2016-07-04 NOTE — Patient Instructions (Signed)
Your white blood count is the same as it has been for several years and is not abnormal.   The thyroid number is also likely normal. We are checking another blood test today to be sure and will call you back with the results.   We have given you the flu shot today.

## 2016-07-04 NOTE — Assessment & Plan Note (Signed)
The WBC is barely outside normal and is consistent with her levels in the last several years and no further work up is indicated for this. If significant change to this value could consider further workup. No B symptoms.

## 2016-07-04 NOTE — Progress Notes (Signed)
   Subjective:    Patient ID: Cynthia Vargas, female    DOB: 1931/10/12, 83 y.o.   MRN: [id number]  HPI The patient is an 83 YO female coming in for abnormal labs with the cardiologist. She has not been seen in 1.5 years. She is concerned about the low white count and abnormal thyroid levels. She denies weight change, heat or cold intolerance, diarrhea or constipation. She denies excessive fatigue although has seen energy decline in the last several years. No fevers or chills.   Review of Systems  Constitutional: Positive for activity change. Negative for appetite change, fatigue, fever and unexpected weight change.  HENT: Negative.   Respiratory: Negative.   Cardiovascular: Negative.   Gastrointestinal: Negative.   Endocrine: Negative for cold intolerance and heat intolerance.  Musculoskeletal: Negative.   Skin: Negative.   Neurological: Negative.       Objective:   Physical Exam  Constitutional: She is oriented to person, place, and time. She appears well-developed and well-nourished.  Thin  HENT:  Head: Normocephalic and atraumatic.  Eyes: EOM are normal.  Neck: Normal range of motion.  Cardiovascular: Normal rate and regular rhythm.   Pulmonary/Chest: Effort normal and breath sounds normal.  Abdominal: Soft. Bowel sounds are normal.  Neurological: She is alert and oriented to person, place, and time. Coordination normal.  Skin: Skin is warm and dry.   Vitals:   07/04/16 1456  BP: (!) 142/80  Pulse: 69  Resp: 16  Temp: 97.8 F (36.6 C)  TempSrc: Oral  SpO2: 99%  Weight: 105 lb 4 oz (47.7 kg)  Height: '5\' 2"'$  (1.575 m)     Assessment & Plan:  Flu shot given at visit.

## 2016-07-04 NOTE — Assessment & Plan Note (Signed)
Given new guidelines TSH is allowable to 10 before treatment indicated. Checking T4 today to ensure thyroid levels are in normal range. If normal no treatment indicated and recheck TSH not indicated without symptoms. She does not have clinical symptoms of hypothyroidism.

## 2016-08-06 ENCOUNTER — Telehealth: Payer: Self-pay | Admitting: Cardiology

## 2016-08-06 NOTE — Telephone Encounter (Signed)
Walk In pt form-[facility] [employer] papers Dropped off-needs Dr.Turner to complete.placed in doctors box.

## 2016-08-14 ENCOUNTER — Telehealth: Payer: Self-pay

## 2016-08-14 NOTE — Telephone Encounter (Signed)
Informed patient that per Dr. Radford Pax, she will need to have PCP fill our paperwork for waste management services. Per patient request, paperwork placed at front desk for pick up. Patient agrees with treatment plan.

## 2016-08-31 ENCOUNTER — Other Ambulatory Visit: Payer: Self-pay | Admitting: Internal Medicine

## 2016-09-16 ENCOUNTER — Telehealth: Payer: Self-pay | Admitting: Internal Medicine

## 2016-09-16 NOTE — Telephone Encounter (Signed)
Called patient to schedule awv. Lvm for patient to call office to schedule appt.  °

## 2016-09-29 ENCOUNTER — Other Ambulatory Visit: Payer: Self-pay | Admitting: Cardiology

## 2016-09-29 DIAGNOSIS — I10 Essential (primary) hypertension: Secondary | ICD-10-CM

## 2016-09-29 DIAGNOSIS — Z0000 Encounter for general adult medical examination without abnormal findings: Secondary | ICD-10-CM

## 2016-11-27 ENCOUNTER — Other Ambulatory Visit: Payer: Self-pay | Admitting: Internal Medicine

## 2016-12-17 ENCOUNTER — Other Ambulatory Visit: Payer: Self-pay | Admitting: Physician Assistant

## 2017-01-20 ENCOUNTER — Other Ambulatory Visit: Payer: Self-pay

## 2017-02-22 IMAGING — CT CT CHEST SUPER D W/O CM
1 series · 15 of 31 positions shown, 19 images · non-contrast
Comparison: PET-CT dated 09/25/2015. Coronary CT chest dated
09/14/2015.

CLINICAL DATA: Pulmonary nodule

EXAM:
CT CHEST WITHOUT CONTRAST
TECHNIQUE: Multidetector CT imaging of the chest was performed using thin slice
collimation for electromagnetic bronchoscopy planning purposes,
without intravenous contrast.

[Series 2: chest w/(date) · axial · 0.58mm/px · z∈[-285,-5]mm · 15 of 62 slices shown, 19 images]
[im 3/62  mediastinal]
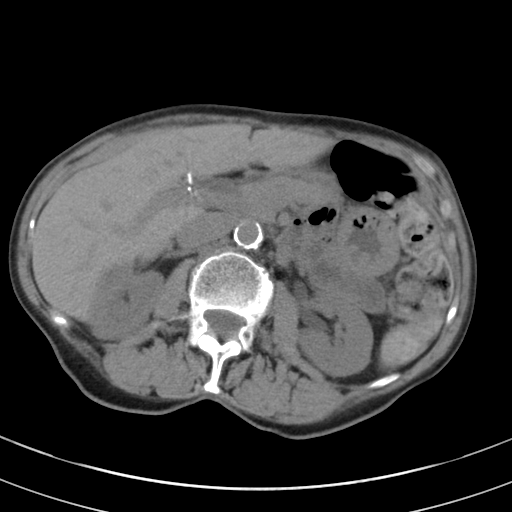
[im 3/62  lung]
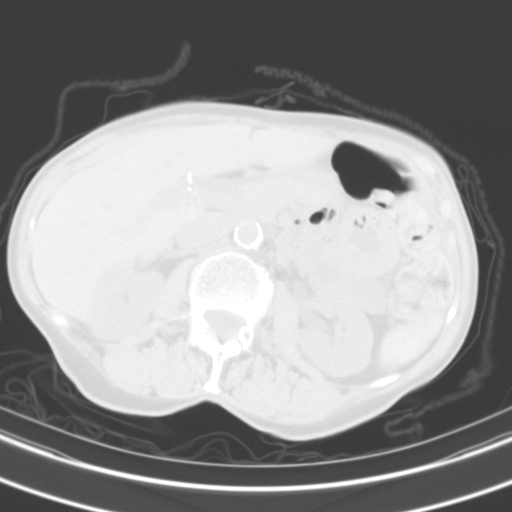
[im 7/62  lung]
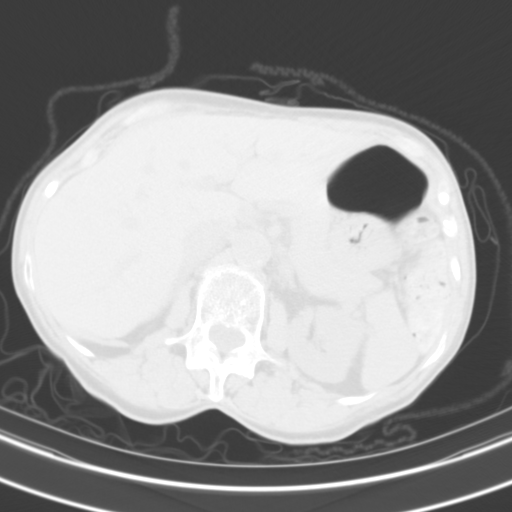
[im 12/62  lung]
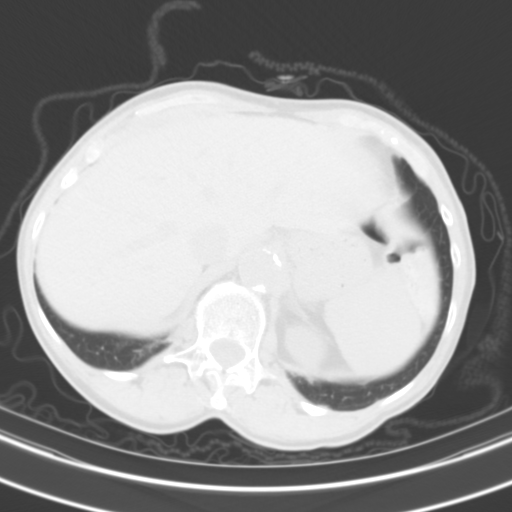
[im 14/62  lung]
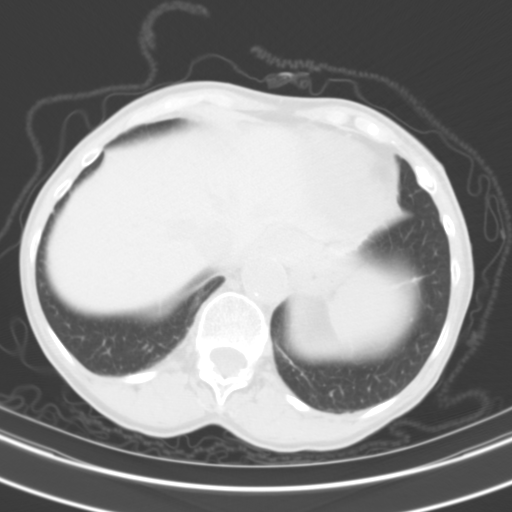
[im 19/62  mediastinal]
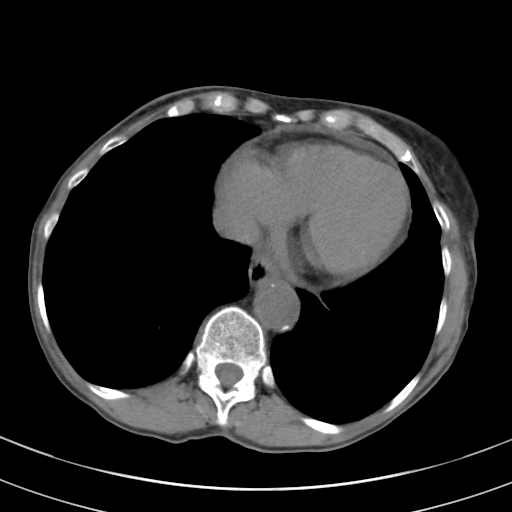
[im 19/62  lung]
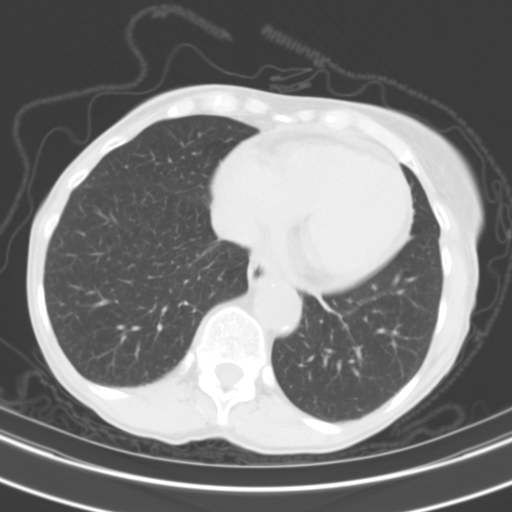
[im 23/62  lung]
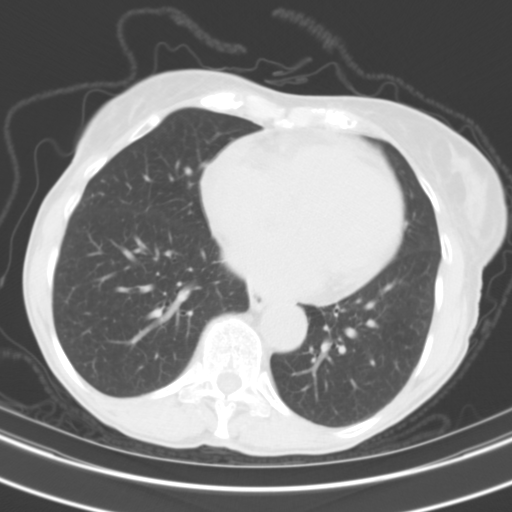
[im 28/62  lung]
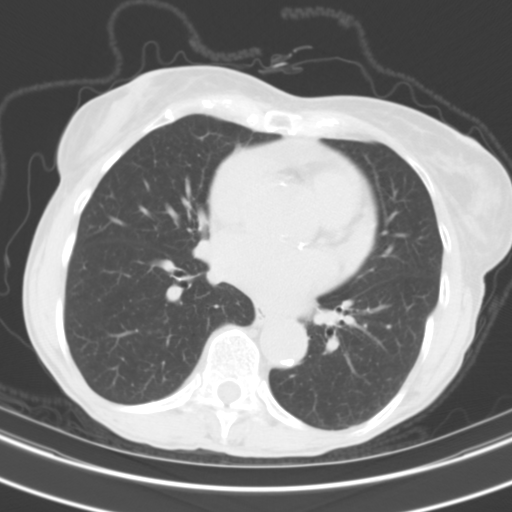
[im 32/62  lung]
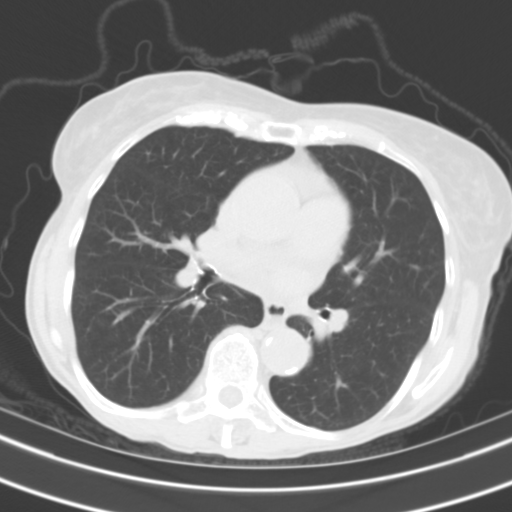
[im 34/62  mediastinal]
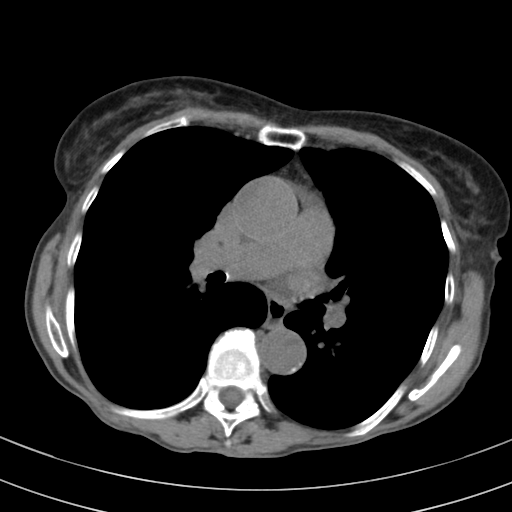
[im 34/62  lung]
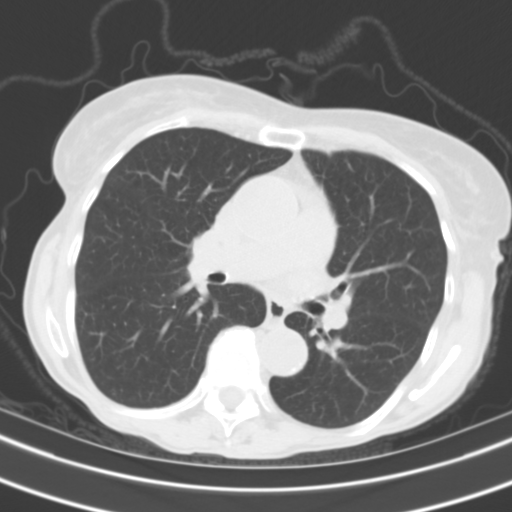
[im 39/62  lung]
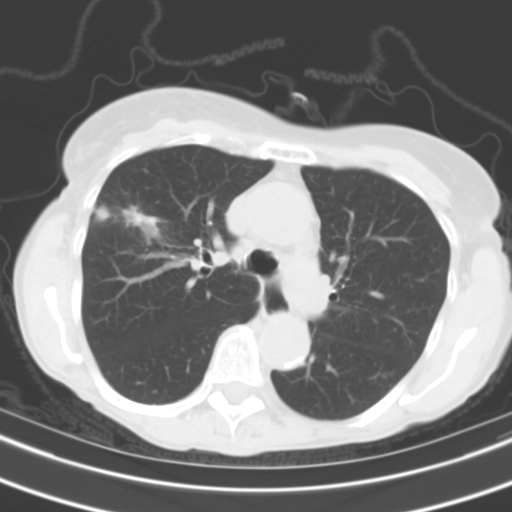
[im 43/62  lung]
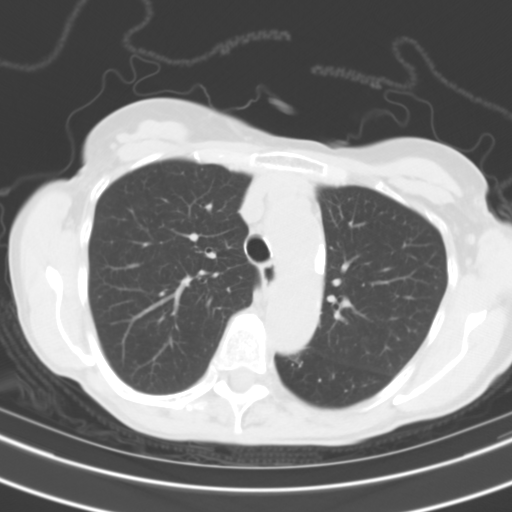
[im 48/62  lung]
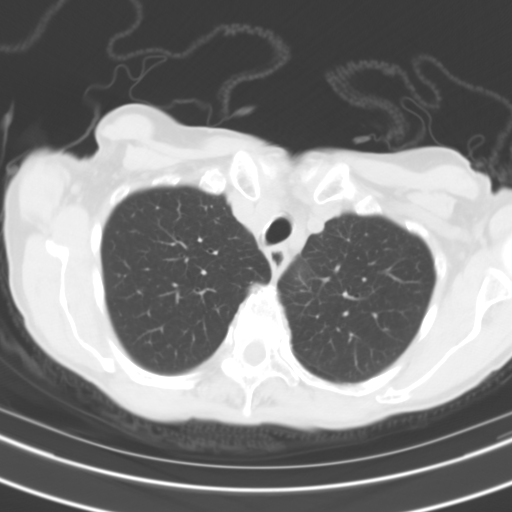
[im 50/62  mediastinal]
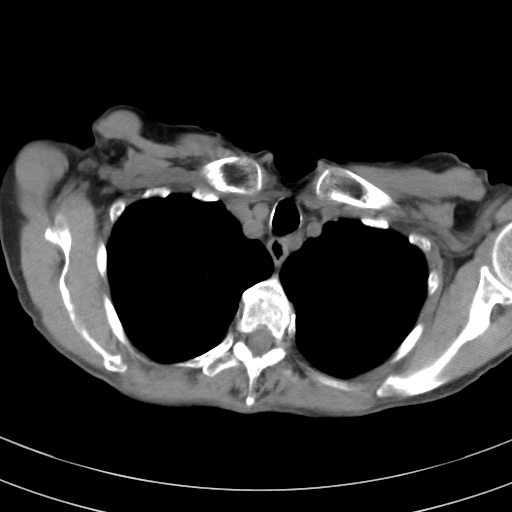
[im 50/62  lung]
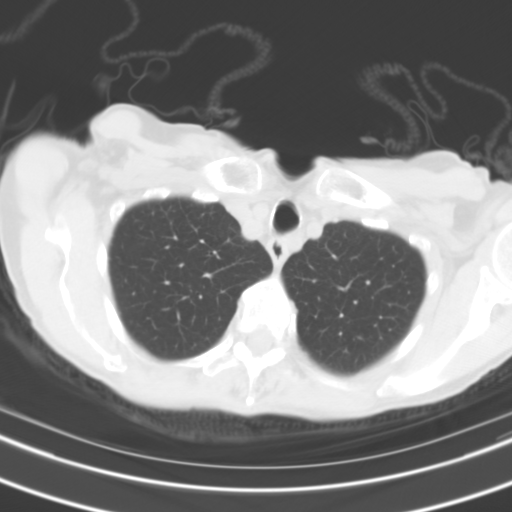
[im 55/62  lung]
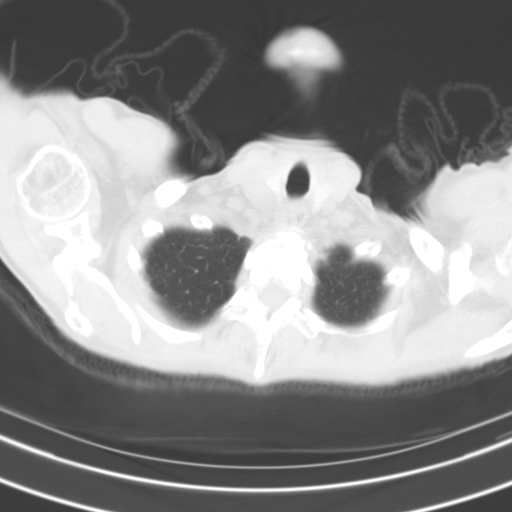
[im 59/62  lung]
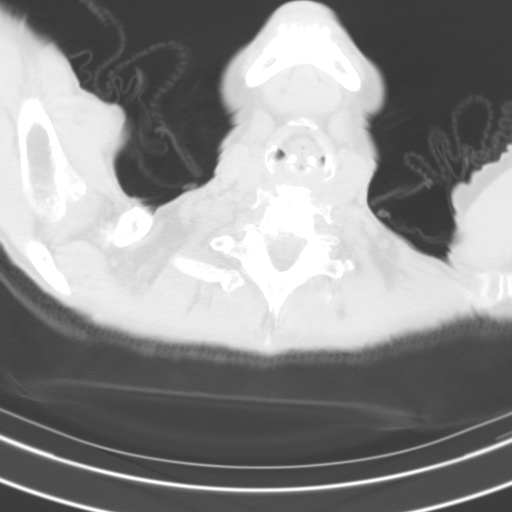

[15 of 31 positions shown; findings below may reference images not displayed]

FINDINGS: Mediastinum/Lymph Nodes: The heart is normal in size. Trace
pericardial effusion.

Mild coronary atherosclerosis in the LAD.

Atherosclerotic calcifications aortic arch.

No suspicious mediastinal lymphadenopathy.

Visualized thyroid is unremarkable.

Lungs/Pleura: Stable nodularity in the lateral right upper lobe left
lobe, grossly unchanged from recent PET and CT.

Dominant spiculated nodule measures approximately 2.4 x 1.3 cm
(series 5/ image 24). Additional subpleural nodule measures 9 mm. An
additional tubular opacity is present along the inferior aspect of
the lesion (series 5/ image 25).

The overall lesion is hypermetabolic on prior PET and is considered
suspicious for primary bronchogenic neoplasm, less likely
infection/inflammation.

Lungs are otherwise clear.  No focal consolidation.

Mild biapical pleural parenchymal scarring.

No pleural effusion or pneumothorax.

Upper abdomen: Visualized upper abdomen is notable for
cholecystectomy clips and vascular calcifications.

Musculoskeletal: Degenerative changes of the thoracic spine.
IMPRESSION: Stable spiculated opacity with adjacent satellite nodularity in the
right upper lobe, hypermetabolic on PET, suspicious for primary
bronchogenic neoplasm. Infection/inflammation is considered less
likely.

## 2017-02-26 ENCOUNTER — Telehealth: Payer: Self-pay | Admitting: Internal Medicine

## 2017-02-26 NOTE — Telephone Encounter (Signed)
Called pt to schedule AWV. Left vm for pt to call office to schedule appt. Last AWV on 9/1//2011. AWV can be scheduled at anytime.

## 2017-03-07 NOTE — Progress Notes (Signed)
Subjective:   Cynthia Vargas is a 83 y.o. female who presents for an Initial Medicare Annual Wellness Visit.  Patient c/o weakness that has lasted for about a month, incontinence, and acid reflux.  Review of Systems    No ROS.  Medicare Wellness Visit. Additional risk factors are reflected in the social history.   Cardiac Risk Factors include: advanced age (>34men, >50 women);hypertension;dyslipidemia Sleep patterns: feels rested on waking, gets up 1 times nightly to void and sleeps 8-9 hours nightly.    Home Safety/Smoke Alarms: Feels safe in home. Smoke alarms in place.  Living environment; residence and Firearm Safety: 2-story house, equipment: Radio producer, Type: [place], no firearms. Lives alone, no needs for DME, good support system Seat Belt Safety/Bike Helmet: Wears seat belt.     Objective:    Today's Vitals   03/10/17 1501  BP: (!) 116/58  Pulse: (!) 58  Resp: 20  SpO2: 96%  Weight: 108 lb (49 kg)  Height: 5\' 2"  (1.575 m)   Body mass index is 19.75 kg/m.   Current Medications (verified) Outpatient Encounter Prescriptions as of 03/10/2017  Medication Sig  . acetaminophen (TYLENOL) 500 MG tablet Take 1,000 mg by mouth 2 (two) times daily as needed for moderate pain.   [name] amLODipine (NORVASC) 5 MG tablet TAKE 1 TABLET BY MOUTH EVERY DAY  . atorvastatin (LIPITOR) 20 MG tablet TAKE 1 TABLET(20 MG) BY MOUTH DAILY  . Multiple Vitamins-Minerals (MULTIVITAMIN PO) Take 1 tablet by mouth.  . nitroGLYCERIN (NITROSTAT) 0.4 MG SL tablet Place 0.4 mg under the tongue every 5 (five) minutes as needed for chest pain.  . [DISCONTINUED] nitroGLYCERIN (NITROSTAT) 0.4 MG SL tablet Place 0.4 mg under the tongue every 5 (five) minutes as needed for chest pain (x 3 doses).  . [DISCONTINUED] pantoprazole (PROTONIX) 40 MG tablet Take 40 mg by mouth daily.  . [DISCONTINUED] pantoprazole (PROTONIX) 40 MG tablet TAKE 1 TABLET BY MOUTH ONCE DAILY  . [DISCONTINUED] Wheat Dextrin  (BENEFIBER DRINK MIX) PACK Take 1 scoop by mouth 3 (three) times daily.    No facility-administered encounter medications on file as of 03/10/2017.     Allergies (verified) Peanut-containing drug products   History: Past Medical History:  Diagnosis Date  . Acute pericarditis 11/01/2015   a. moderate pericardial effusion by echo 09/2015 - signficantly improved with trivial effusion on repeat echo after NSAIDS and colchicine 10/2015.  [name] Aortic insufficiency   . CALCANEAL FRACTURE, RIGHT 11/04/2007  . Essential hypertension   . GERD (gastroesophageal reflux disease)   . Hyperlipidemia LDL goal <70 02/08/2016  . KNEE PAIN, RIGHT, CHRONIC 03/19/2010  . Lung nodule    a. found on CT 2017 - possible stage IIB carcinoma.  . Mitral regurgitation   . Nonobstructive atherosclerosis of coronary artery   . OSTEOPOROSIS 05/02/2007  . PEPTIC ULCER DISEASE 05/02/2007   Past Surgical History:  Procedure Laterality Date  . Calcaneal fracture, right    . CHOLECYSTECTOMY    . DILATION AND CURETTAGE OF UTERUS    . ESOPHAGOGASTRODUODENOSCOPY N/A 03/30/2013   Procedure: ESOPHAGOGASTRODUODENOSCOPY (EGD);  Surgeon: Jerene Bears, MD;  Location: [facility];  Service: Endoscopy;  Laterality: N/A;  . FUDUCIAL PLACEMENT N/A 09/29/2015   Procedure:  PLACEMENT OF FUDUCIAL Marker times three;  Surgeon: Grace Isaac, MD;  Location: [facility];  Service: Thoracic;  Laterality: N/A;  . LUNG BIOPSY N/A 09/29/2015   Procedure: LUNG BIOPSY;  Surgeon: Grace Isaac, MD;  Location: [facility];  Service: Thoracic;  Laterality: N/A;  . ORIF HIP FRACTURE    . TONSILLECTOMY    . VIDEO BRONCHOSCOPY WITH ENDOBRONCHIAL NAVIGATION N/A 09/29/2015   Procedure: VIDEO BRONCHOSCOPY WITH ENDOBRONCHIAL NAVIGATION;  Surgeon: Grace Isaac, MD;  Location: [facility];  Service: Thoracic;  Laterality: N/A;   Family History  Problem Relation Age of Onset  . Cancer Father   . Heart disease Mother   . Diabetes Neg Hx   . Hyperlipidemia Neg Hx     . Hypertension Neg Hx    Social History   Occupational History  . reitred    Social History Main Topics  . Smoking status: Never Smoker  . Smokeless tobacco: Never Used  . Alcohol use 0.6 oz/week    1 Glasses of wine per week  . Drug use: No  . Sexual activity: No    Tobacco Counseling Counseling given: Not Answered   Activities of Daily Living In your present state of health, do you have any difficulty performing the following activities: 03/10/2017  Hearing? N  Vision? N  Difficulty concentrating or making decisions? N  Walking or climbing stairs? N  Dressing or bathing? N  Doing errands, shopping? N  Preparing Food and eating ? N  Using the Toilet? N  In the past six months, have you accidently leaked urine? N  Do you have problems with loss of bowel control? N  Managing your Medications? N  Managing your Finances? N  Housekeeping or managing your Housekeeping? N  Some recent data might be hidden    Immunizations and Health Maintenance Immunization History  Administered Date(s) Administered  . Influenza Whole 03/19/2010  . Influenza, High Dose Seasonal PF 07/04/2016, 03/10/2017  . Influenza, Seasonal, Injecte, Preservative Fre 08/03/2012  . Influenza,inj,Quad PF,6+ Mos 03/24/2013  . Influenza-Unspecified 07/19/2013  . Tetanus 08/03/2012   Health Maintenance Due  Topic Date Due  . PNA vac Low Risk Adult (1 of 2 - PCV13) 01/20/1999    Patient Care Team: Hoyt Koch, MD as PCP - General (Internal Medicine) Sueanne Margarita, MD as Consulting Physician (Cardiology)  Indicate any recent Medical Services you may have received from other than [facility] providers in the past year (date may be approximate).     Assessment:   This is a routine wellness examination for [place]. Physical assessment deferred to PCP.   Hearing/Vision screen  Visual Acuity Screening   Right eye Left eye Both eyes  Without correction:     With correction:   20/32   Comments: appointment yearly   Hearing Screening Comments: Reports HOH right ear. Passed whisper test   Dietary issues and exercise activities discussed: Current Exercise Habits: Home exercise routine, Type of exercise: walking, Time (Minutes): 25, Frequency (Times/Week): 7, Weekly Exercise (Minutes/Week): 175, Intensity: Mild, Exercise limited by: orthopedic condition(s)  Diet (meal preparation, eat out, water intake, caffeinated beverages, dairy products, fruits and vegetables): in general, a "healthy" diet  , well balanced, eats a variety of fruits and vegetables daily, limits salt, fat/cholesterol, sugar, caffeine, drinks 2-3 glasses of water daily.  Encouraged patient to increase daily water intake.     Goals    . Be as active and as independent as possible          Enjoy  life, go to the park, travel, and walk my dog.       Depression Screen PHQ 2/9 Scores 03/10/2017  PHQ - 2 Score 0  PHQ- 9 Score 3    Fall Risk  Fall Risk  03/10/2017  Falls in the past year? No    Cognitive Function: MMSE - Mini Mental State Exam 03/10/2017  Orientation to time 5  Orientation to Place 5  Registration 3  Attention/ Calculation 5  Recall 1  Language- name 2 objects 2  Language- repeat 1  Language- follow 3 step command 3  Language- read & follow direction 1  Write a sentence 1  Copy design 1  Total score 28        Screening Tests Health Maintenance  Topic Date Due  . PNA vac Low Risk Adult (1 of 2 - PCV13) 01/20/1999  . DEXA SCAN  03/10/2018 (Originally 01/20/1999)  . TETANUS/TDAP  08/03/2022  . INFLUENZA VACCINE  Addressed      Plan:     Scheduled PCP appointment 03/18/17 to discuss weakness that has lasted for about a month, incontinence, and acid reflux.  Continue doing brain stimulating activities (puzzles, reading, adult coloring books, staying active) to keep memory sharp.   Continue to eat heart healthy diet (full of fruits, vegetables, whole grains, lean  protein, water--limit salt, fat, and sugar intake) and increase physical activity as tolerated.  I have personally reviewed and noted the following in the patient's chart:   . Medical and social history . Use of alcohol, tobacco or illicit drugs  . Current medications and supplements . Functional ability and status . Nutritional status . Physical activity . Advanced directives . List of other physicians . Vitals . Screenings to include cognitive, depression, and falls . Referrals and appointments  In addition, I have reviewed and discussed with patient certain preventive protocols, quality metrics, and best practice recommendations. A written personalized care plan for preventive services as well as general preventive health recommendations were provided to patient.     Michiel Cowboy, RN   03/10/2017

## 2017-03-07 NOTE — Progress Notes (Signed)
Pre visit review using our clinic review tool, if applicable. No additional management support is needed unless otherwise documented below in the visit note. 

## 2017-03-10 ENCOUNTER — Ambulatory Visit (INDEPENDENT_AMBULATORY_CARE_PROVIDER_SITE_OTHER): Payer: Medicare Other | Admitting: *Deleted

## 2017-03-10 VITALS — BP 116/58 | HR 58 | Resp 20 | Ht 62.0 in | Wt 108.0 lb

## 2017-03-10 DIAGNOSIS — Z23 Encounter for immunization: Secondary | ICD-10-CM

## 2017-03-10 DIAGNOSIS — Z0000 Encounter for general adult medical examination without abnormal findings: Secondary | ICD-10-CM | POA: Diagnosis not present

## 2017-03-10 NOTE — Patient Instructions (Addendum)
Continue doing brain stimulating activities (puzzles, reading, adult coloring books, staying active) to keep memory sharp.   Continue to eat heart healthy diet (full of fruits, vegetables, whole grains, lean protein, water--limit salt, fat, and sugar intake) and increase physical activity as tolerated.  Cynthia Vargas , Thank you for taking time to come for your Medicare Wellness Visit. I appreciate your ongoing commitment to your health goals. Please review the following plan we discussed and let me know if I can assist you in the future.   These are the goals we discussed: Goals    . Be as active and as independent as possible          Enjoy  life, go to the park and walk my dog.        This is a list of the screening recommended for you and due dates:  Health Maintenance  Topic Date Due  . Pneumonia vaccines (1 of 2 - PCV13) 01/20/1999  . Flu Shot  01/29/2017  . DEXA scan (bone density measurement)  03/10/2018*  . Tetanus Vaccine  08/03/2022  *Topic was postponed. The date shown is not the original due date.  \Influenza Virus Vaccine (Flucelvax) What is this medicine? INFLUENZA VIRUS VACCINE (in floo EN zuh VAHY ruhs vak SEEN) helps to reduce the risk of getting influenza also known as the flu. The vaccine only helps protect you against some strains of the flu. This medicine may be used for other purposes; ask your health care provider or pharmacist if you have questions. COMMON BRAND NAME(S): FLUCELVAX What should I tell my health care provider before I take this medicine? They need to know if you have any of these conditions: -bleeding disorder like hemophilia -fever or infection -Guillain-Barre syndrome or other neurological problems -immune system problems -infection with the human immunodeficiency virus (HIV) or AIDS -low blood platelet counts -multiple sclerosis -an unusual or allergic reaction to influenza virus vaccine, other medicines, foods, dyes or  preservatives -pregnant or trying to get pregnant -breast-feeding How should I use this medicine? This vaccine is for injection into a muscle. It is given by a health care professional. A copy of Vaccine Information Statements will be given before each vaccination. Read this sheet carefully each time. The sheet may change frequently. Talk to your pediatrician regarding the use of this medicine in children. Special care may be needed. Overdosage: If you think you've taken too much of this medicine contact a poison control center or emergency room at once. Overdosage: If you think you have taken too much of this medicine contact a poison control center or emergency room at once. NOTE: This medicine is only for you. Do not share this medicine with others. What if I miss a dose? This does not apply. What may interact with this medicine? -chemotherapy or radiation therapy -medicines that lower your immune system like etanercept, anakinra, infliximab, and adalimumab -medicines that treat or prevent blood clots like warfarin -phenytoin -steroid medicines like prednisone or cortisone -theophylline -vaccines This list may not describe all possible interactions. Give your health care provider a list of all the medicines, herbs, non-prescription drugs, or dietary supplements you use. Also tell them if you smoke, drink alcohol, or use illegal drugs. Some items may interact with your medicine. What should I watch for while using this medicine? Report any side effects that do not go away within 3 days to your doctor or health care professional. Call your health care provider if any unusual symptoms occur within  6 weeks of receiving this vaccine. You may still catch the flu, but the illness is not usually as bad. You cannot get the flu from the vaccine. The vaccine will not protect against colds or other illnesses that may cause fever. The vaccine is needed every year. What side effects may I notice from  receiving this medicine? Side effects that you should report to your doctor or health care professional as soon as possible: -allergic reactions like skin rash, itching or hives, swelling of the face, lips, or tongue Side effects that usually do not require medical attention (Report these to your doctor or health care professional if they continue or are bothersome.): -fever -headache -muscle aches and pains -pain, tenderness, redness, or swelling at the injection site -tiredness This list may not describe all possible side effects. Call your doctor for medical advice about side effects. You may report side effects to FDA at 1-800-FDA-1088. Where should I keep my medicine? The vaccine will be given by a health care professional in a clinic, pharmacy, doctor's office, or other health care setting. You will not be given vaccine doses to store at home. NOTE: This sheet is a summary. It may not cover all possible information. If you have questions about this medicine, talk to your doctor, pharmacist, or health care provider.  2018 Elsevier/Gold Standard (2011-05-29 14:06:47)

## 2017-03-11 NOTE — Progress Notes (Signed)
Medical screening examination/treatment/procedure(s) were performed by non-physician practitioner and as supervising physician I was immediately available for consultation/collaboration. I agree with above. Rivka A Crawford, MD 

## 2017-03-18 ENCOUNTER — Other Ambulatory Visit (INDEPENDENT_AMBULATORY_CARE_PROVIDER_SITE_OTHER): Payer: Medicare Other

## 2017-03-18 ENCOUNTER — Encounter: Payer: Self-pay | Admitting: Internal Medicine

## 2017-03-18 ENCOUNTER — Ambulatory Visit (INDEPENDENT_AMBULATORY_CARE_PROVIDER_SITE_OTHER): Payer: Medicare Other | Admitting: Internal Medicine

## 2017-03-18 VITALS — BP 118/76 | HR 66 | Temp 98.4°F | Ht 62.0 in | Wt 103.0 lb

## 2017-03-18 DIAGNOSIS — D649 Anemia, unspecified: Secondary | ICD-10-CM | POA: Diagnosis not present

## 2017-03-18 DIAGNOSIS — R7989 Other specified abnormal findings of blood chemistry: Secondary | ICD-10-CM

## 2017-03-18 DIAGNOSIS — E559 Vitamin D deficiency, unspecified: Secondary | ICD-10-CM | POA: Diagnosis not present

## 2017-03-18 DIAGNOSIS — R946 Abnormal results of thyroid function studies: Secondary | ICD-10-CM | POA: Diagnosis not present

## 2017-03-18 DIAGNOSIS — E538 Deficiency of other specified B group vitamins: Secondary | ICD-10-CM | POA: Diagnosis not present

## 2017-03-18 DIAGNOSIS — R531 Weakness: Secondary | ICD-10-CM

## 2017-03-18 LAB — COMPREHENSIVE METABOLIC PANEL
ALT: 15 U/L (ref 0–35)
AST: 22 U/L (ref 0–37)
Albumin: 4.2 g/dL (ref 3.5–5.2)
Alkaline Phosphatase: 50 U/L (ref 39–117)
BUN: 12 mg/dL (ref 6–23)
CO2: 29 mEq/L (ref 19–32)
Calcium: 9.3 mg/dL (ref 8.4–10.5)
Chloride: 100 mEq/L (ref 96–112)
Creatinine, Ser: 0.85 mg/dL (ref 0.40–1.20)
GFR: 67.86 mL/min (ref >60.00)
Glucose, Bld: 84 mg/dL (ref 70–99)
Potassium: 4.1 mEq/L (ref 3.5–5.1)
Sodium: 135 mEq/L (ref 135–145)
Total Bilirubin: 0.4 mg/dL (ref 0.2–1.2)
Total Protein: 6.7 g/dL (ref 6.0–8.3)

## 2017-03-18 LAB — CBC
HCT: 36.7 % (ref 36.0–46.0)
Hemoglobin: 12.1 g/dL (ref 12.0–15.0)
MCHC: 33.1 g/dL (ref 30.0–36.0)
MCV: 91.5 fl (ref 78.0–100.0)
Platelets: 205 10*3/uL (ref 150.0–400.0)
RBC: 4.01 Mil/uL (ref 3.87–5.11)
RDW: 14.1 % (ref 11.5–15.5)
WBC: 4.4 10*3/uL (ref 4.0–10.5)

## 2017-03-18 LAB — FERRITIN: Ferritin: 14.2 ng/mL (ref 10.0–291.0)

## 2017-03-18 LAB — VITAMIN D 25 HYDROXY (VIT D DEFICIENCY, FRACTURES): VITD: 39.29 ng/mL (ref 30.00–100.00)

## 2017-03-18 LAB — TSH: TSH: 5.89 u[IU]/mL — ABNORMAL HIGH (ref 0.35–4.50)

## 2017-03-18 LAB — VITAMIN B12: Vitamin B-12: 481 pg/mL (ref 211–911)

## 2017-03-18 LAB — T4, FREE: Free T4: 0.8 ng/dL (ref 0.60–1.60)

## 2017-03-18 NOTE — Progress Notes (Signed)
   Subjective:    Patient ID: Cynthia Vargas, female    DOB: 07-Jul-1931, 83 y.o.   MRN: [id number]  HPI The patient is an 83 YO female coming in for weakness. She has been getting muscle fatigue when she is walking the dog. She also feels that she is not able to tolerate even a cup of coffee or alcohol without feeling "bad". Denies heat or cold intolerance. She has not had weight change. Diet is similar. Previous anemia although she cannot say if this feels like that. She also had previous abnormal TSH without treatment which resolved. Denies any problems with mood or depression.   Review of Systems  Constitutional: Positive for activity change and fatigue. Negative for appetite change, diaphoresis, fever and unexpected weight change.  HENT: Negative.   Eyes: Negative.   Respiratory: Negative.   Cardiovascular: Negative.   Gastrointestinal: Negative.   Musculoskeletal: Positive for arthralgias.  Skin: Negative.   Neurological: Positive for weakness.      Objective:   Physical Exam  Constitutional: She is oriented to person, place, and time. She appears well-developed and well-nourished.  HENT:  Head: Normocephalic and atraumatic.  Eyes: EOM are normal.  Neck: Normal range of motion.  Cardiovascular: Normal rate and regular rhythm.   Pulmonary/Chest: Effort normal. No respiratory distress. She has no wheezes. She has no rales.  Abdominal: Soft. She exhibits no distension. There is no tenderness. There is no rebound.  Musculoskeletal: She exhibits no edema.  Neurological: She is alert and oriented to person, place, and time. Coordination normal.  Skin: Skin is warm and dry.   Vitals:   03/18/17 1526  BP: 118/76  Pulse: 66  Temp: 98.4 F (36.9 C)  TempSrc: Oral  SpO2: 99%  Weight: 103 lb (46.7 kg)  Height: 5\' 2"  (1.575 m)      Assessment & Plan:

## 2017-03-18 NOTE — Patient Instructions (Signed)
We are going to see if we can find the cause of the weakness.

## 2017-03-21 NOTE — Assessment & Plan Note (Signed)
Given history and recurrent weakness will check CBC, ferritin, B12 to evaluate for anemia.

## 2017-03-21 NOTE — Assessment & Plan Note (Signed)
Checking TSH, free T4, vitamin D, B12, CBC, CMP for cause. Treat as appropriate.

## 2017-08-04 ENCOUNTER — Other Ambulatory Visit: Payer: Self-pay | Admitting: Cardiology

## 2017-08-04 DIAGNOSIS — I10 Essential (primary) hypertension: Secondary | ICD-10-CM

## 2017-08-04 DIAGNOSIS — Z0000 Encounter for general adult medical examination without abnormal findings: Secondary | ICD-10-CM

## 2017-09-01 ENCOUNTER — Other Ambulatory Visit: Payer: Self-pay | Admitting: Cardiology

## 2017-09-01 DIAGNOSIS — Z0000 Encounter for general adult medical examination without abnormal findings: Secondary | ICD-10-CM

## 2017-09-01 DIAGNOSIS — I10 Essential (primary) hypertension: Secondary | ICD-10-CM

## 2017-09-04 ENCOUNTER — Other Ambulatory Visit: Payer: Self-pay | Admitting: Cardiology

## 2017-09-04 DIAGNOSIS — I10 Essential (primary) hypertension: Secondary | ICD-10-CM

## 2017-09-04 DIAGNOSIS — Z0000 Encounter for general adult medical examination without abnormal findings: Secondary | ICD-10-CM

## 2017-09-12 ENCOUNTER — Other Ambulatory Visit: Payer: Self-pay | Admitting: Physician Assistant

## 2017-10-02 ENCOUNTER — Other Ambulatory Visit: Payer: Self-pay | Admitting: Cardiology

## 2017-10-02 DIAGNOSIS — I10 Essential (primary) hypertension: Secondary | ICD-10-CM

## 2017-10-02 DIAGNOSIS — Z0000 Encounter for general adult medical examination without abnormal findings: Secondary | ICD-10-CM

## 2017-10-02 MED ORDER — ATORVASTATIN CALCIUM 20 MG PO TABS: 20.0000 mg | ORAL_TABLET | Freq: Every day | ORAL | 0 refills | Status: DC

## 2017-10-10 ENCOUNTER — Other Ambulatory Visit: Payer: Self-pay | Admitting: Physician Assistant

## 2017-10-27 ENCOUNTER — Telehealth: Payer: Self-pay | Admitting: Cardiology

## 2017-10-27 ENCOUNTER — Other Ambulatory Visit: Payer: Self-pay | Admitting: *Deleted

## 2017-10-27 MED ORDER — AMLODIPINE BESYLATE 5 MG PO TABS: 5.0000 mg | ORAL_TABLET | Freq: Every day | ORAL | 0 refills | Status: DC

## 2017-10-27 NOTE — Telephone Encounter (Signed)
New message:       *STAT* If patient is at the pharmacy, call can be transferred to refill team.   1. Which medications need to be refilled? (please list name of each medication and dose if known) amLODipine (NORVASC) 5 MG tablet  2. Which pharmacy/location (including street and city if local pharmacy) is medication to be sent to?[facility], [address] AT [address] [place]  3. Do they need a 30 day or 90 day supply? 30    Pt has an appt w/[facility] on 5/21

## 2017-11-04 ENCOUNTER — Other Ambulatory Visit: Payer: Self-pay | Admitting: Cardiology

## 2017-11-04 ENCOUNTER — Other Ambulatory Visit: Payer: Self-pay | Admitting: *Deleted

## 2017-11-04 DIAGNOSIS — I10 Essential (primary) hypertension: Secondary | ICD-10-CM

## 2017-11-04 DIAGNOSIS — Z0000 Encounter for general adult medical examination without abnormal findings: Secondary | ICD-10-CM

## 2017-11-18 ENCOUNTER — Ambulatory Visit (INDEPENDENT_AMBULATORY_CARE_PROVIDER_SITE_OTHER): Payer: Medicare Other | Admitting: Cardiology

## 2017-11-18 ENCOUNTER — Encounter: Payer: Self-pay | Admitting: Cardiology

## 2017-11-18 VITALS — BP 122/56 | HR 74 | Ht 62.0 in | Wt 108.0 lb

## 2017-11-18 DIAGNOSIS — I10 Essential (primary) hypertension: Secondary | ICD-10-CM | POA: Diagnosis not present

## 2017-11-18 DIAGNOSIS — I3139 Other pericardial effusion (noninflammatory): Secondary | ICD-10-CM

## 2017-11-18 DIAGNOSIS — R079 Chest pain, unspecified: Secondary | ICD-10-CM | POA: Diagnosis not present

## 2017-11-18 DIAGNOSIS — I2510 Atherosclerotic heart disease of native coronary artery without angina pectoris: Secondary | ICD-10-CM

## 2017-11-18 DIAGNOSIS — I351 Nonrheumatic aortic (valve) insufficiency: Secondary | ICD-10-CM

## 2017-11-18 DIAGNOSIS — I313 Pericardial effusion (noninflammatory): Secondary | ICD-10-CM

## 2017-11-18 DIAGNOSIS — E785 Hyperlipidemia, unspecified: Secondary | ICD-10-CM | POA: Diagnosis not present

## 2017-11-18 NOTE — Patient Instructions (Signed)
Medication Instructions:  Your physician recommends that you continue on your current medications as directed. Please refer to the Current Medication list given to you today.  If you need a refill on your cardiac medications, please contact your pharmacy first.  Labwork: Your physician recommends that you schedule lab work for fasting lipid and liver function when you return for stress test   Testing/Procedures: Your physician has requested that you have an echocardiogram. Echocardiography is a painless test that uses sound waves to create images of your heart. It provides your doctor with information about the size and shape of your heart and how well your heart's chambers and valves are working. This procedure takes approximately one hour. There are no restrictions for this procedure.  Your physician has requested that you have a lexiscan myoview. For further information please visit [email or web address]. Please follow instruction sheet, as given.  Follow-Up: Your physician wants you to follow-up in: 1 year with Dr. Radford Pax. You will receive a reminder letter in the mail two months in advance. If you don't receive a letter, please call our office to schedule the follow-up appointment.  Any Other Special Instructions Will Be Listed Below (If Applicable).   Thank you for choosing [facility], RN  [phone]  If you need a refill on your cardiac medications before your next appointment, please call your pharmacy.

## 2017-11-18 NOTE — Progress Notes (Signed)
Cardiology Office Note:    Date:  11/18/2017   ID:  Cynthia Vargas, DOB 1931-12-11, MRN [id number]  PCP:  Hoyt Koch, MD  Cardiologist:  No primary care provider on file.    Referring MD: Hoyt Koch, *   Chief Complaint  Patient presents with  . Coronary Artery Disease  . Hypertension  . Hyperlipidemia  . Aortic Insuffiency    History of Present Illness:    Cynthia Vargas is a 84 y.o. female with a hx of HTN, nonobstructive CAD, stage IIB carcinoma of the lung, mild to moderate AI and moderate pericardial effusion with acute pericarditis treated with NSAIDs and colchicine.  She was treated for 3 months on colchicine.  Last 2D echocardiogram showed normal LV function with moderate AI, mild MR and trivial pericardial effusion.  She is here today for followup and is doing well.  She denies any  SOB, DOE, PND, orthopnea, LE edema, dizziness, palpitations or syncope.  She has had some episodes of chest discomfort that are associated with exertion and resolve with rest.  She cannot remember if these were similar to what she had back during her stress test in 2017.  She is compliant with her meds and is tolerating meds with no SE.      Past Medical History:  Diagnosis Date  . Acute pericarditis 11/01/2015   a. moderate pericardial effusion by echo 09/2015 - signficantly improved with trivial effusion on repeat echo after NSAIDS and colchicine 10/2015.  [name] Aortic insufficiency   . CALCANEAL FRACTURE, RIGHT 11/04/2007  . Essential hypertension   . GERD (gastroesophageal reflux disease)   . Hyperlipidemia LDL goal <70 02/08/2016  . KNEE PAIN, RIGHT, CHRONIC 03/19/2010  . Lung nodule    a. found on CT 2017 - possible stage IIB carcinoma.  . Mitral regurgitation   . Nonobstructive atherosclerosis of coronary artery   . OSTEOPOROSIS 05/02/2007  . PEPTIC ULCER DISEASE 05/02/2007    Past Surgical History:  Procedure Laterality Date  . Calcaneal fracture, right    .  CHOLECYSTECTOMY    . DILATION AND CURETTAGE OF UTERUS    . ESOPHAGOGASTRODUODENOSCOPY N/A 03/30/2013   Procedure: ESOPHAGOGASTRODUODENOSCOPY (EGD);  Surgeon: Jerene Bears, MD;  Location: [facility];  Service: Endoscopy;  Laterality: N/A;  . FUDUCIAL PLACEMENT N/A 09/29/2015   Procedure:  PLACEMENT OF FUDUCIAL Marker times three;  Surgeon: Grace Isaac, MD;  Location: [place];  Service: Thoracic;  Laterality: N/A;  . LUNG BIOPSY N/A 09/29/2015   Procedure: LUNG BIOPSY;  Surgeon: Grace Isaac, MD;  Location: [place];  Service: Thoracic;  Laterality: N/A;  . ORIF HIP FRACTURE    . TONSILLECTOMY    . VIDEO BRONCHOSCOPY WITH ENDOBRONCHIAL NAVIGATION N/A 09/29/2015   Procedure: VIDEO BRONCHOSCOPY WITH ENDOBRONCHIAL NAVIGATION;  Surgeon: Grace Isaac, MD;  Location: [facility];  Service: Thoracic;  Laterality: N/A;    Current Medications: Current Meds  Medication Sig  . acetaminophen (TYLENOL) 500 MG tablet Take 1,000 mg by mouth 2 (two) times daily as needed for moderate pain.   [name] amLODipine (NORVASC) 5 MG tablet Take 1 tablet (5 mg total) by mouth daily. Patient must keep 11/18/17 appointment for further refills  . atorvastatin (LIPITOR) 20 MG tablet Take 1 tablet (20 mg total) by mouth daily. Patient must keep upcoming appointment for further refills  . Multiple Vitamins-Minerals (MULTIVITAMIN PO) Take 1 tablet by mouth.  . nitroGLYCERIN (NITROSTAT) 0.4 MG SL tablet Place 0.4 mg under the  tongue every 5 (five) minutes as needed for chest pain.     Allergies:   Peanut-containing drug products   Social History   Socioeconomic History  . Marital status: Single    Spouse name: Not on file  . Number of children: 1  . Years of education: 70  . Highest education level: Not on file  Occupational History  . Occupation: reitred  Social Needs  . Financial resource strain: Not on file  . Food insecurity:    Worry: Not on file    Inability: Not on file  . Transportation needs:    Medical:  Not on file    Non-medical: Not on file  Tobacco Use  . Smoking status: Never Smoker  . Smokeless tobacco: Never Used  Substance and Sexual Activity  . Alcohol use: Yes    Alcohol/week: 0.6 oz    Types: 1 Glasses of wine per week  . Drug use: No  . Sexual activity: Never  Lifestyle  . Physical activity:    Days per week: Not on file    Minutes per session: Not on file  . Stress: Not on file  Relationships  . Social connections:    Talks on phone: Not on file    Gets together: Not on file    Attends religious service: Not on file    Active member of club or organization: Not on file    Attends meetings of clubs or organizations: Not on file    Relationship status: Not on file  Other Topics Concern  . Not on file  Social History Narrative   [employer] degrees in Development worker, community. married for 2 years then single . 1 son - Cynthia Vargas. 2 daughters-put up for adoption. Lives alone and is independent in ADL's. works at [employer] until injured.     Family History: The patient's family history includes Cancer in her father; Heart disease in her mother. There is no history of Diabetes, Hyperlipidemia, or Hypertension.  ROS:   Please see the history of present illness.    ROS  All other systems reviewed and negative.   EKGs/Labs/Other Studies Reviewed:    The following studies were reviewed today: none  EKG:  EKG is  ordered today.  The ekg ordered today demonstrates normal sinus rhythm at 74 bpm with no ST-T wave of normalities.  Recent Labs: 03/18/2017: ALT 15; BUN 12; Creatinine, Ser 0.85; Hemoglobin 12.1; Platelets 205.0; Potassium 4.1; Sodium 135; TSH 5.89   Recent Lipid Panel    Component Value Date/Time   CHOL 154 11/22/2015 1131   TRIG 34 11/22/2015 1131   HDL 101 11/22/2015 1131   CHOLHDL 1.5 11/22/2015 1131   VLDL 7 11/22/2015 1131   LDLCALC 46 11/22/2015 1131   LDLDIRECT 115.1 03/19/2010 1406    Physical Exam:    VS:  BP (!) 122/56   Pulse 74   Ht 5\' 2"   (1.575 m)   Wt 108 lb (49 kg)   BMI 19.75 kg/m     Wt Readings from Last 3 Encounters:  11/18/17 108 lb (49 kg)  03/18/17 103 lb (46.7 kg)  03/10/17 108 lb (49 kg)     GEN:  Well nourished, well developed in no acute distress HEENT: Normal NECK: No JVD; No carotid bruits LYMPHATICS: No lymphadenopathy CARDIAC: RRR, no murmurs, rubs, gallops RESPIRATORY:  Clear to auscultation without rales, wheezing or rhonchi  ABDOMEN: Soft, non-tender, non-distended MUSCULOSKELETAL:  No edema; No deformity  SKIN: Warm and dry NEUROLOGIC:  Alert and oriented x 3 PSYCHIATRIC:  Normal affect   ASSESSMENT:    1. Pericardial effusion   2. Essential hypertension   3. Coronary artery disease involving native coronary artery of native heart without angina pectoris   4. Nonrheumatic aortic valve insufficiency   5. Hyperlipidemia LDL goal <70    PLAN:    In order of problems listed above:  1.  H/O pericardial effusion with pericarditis - this has completely resolved and she has had no further chest pain.  2.  Hypertension - BP is well controlled on exam today.  She will continue on amlodipine 5 mg daily.  3.  Nonobstructive CAD -she has had some episodic chest pain intermittently with exertion that resolves with rest.  I recommended getting a Lexiscan Myoview to rule out ischemia.  She is been told not to take aspirin by her PCP in the past.  4.  Moderate aortic insufficiency -2D echocardiogram 11/22/2015 showed moderate aortic insufficiency.  She was was to have a repeat echo but never followed back.  I will repeat an echo to check for progression of her AI.  5.  Hyperlipidemia with LDL goal less than 70.  She will continue on atorvastatin 20 mg daily.  Check an FLP and ALT.  Medication Adjustments/Labs and Tests Ordered: Current medicines are reviewed at length with the patient today.  Concerns regarding medicines are outlined above.  Orders Placed This Encounter  Procedures  . EKG 12-Lead     No orders of the defined types were placed in this encounter.   Signed, Fransico Him, MD  11/18/2017 4:03 PM    [facility]

## 2017-11-25 ENCOUNTER — Other Ambulatory Visit: Payer: Self-pay | Admitting: Cardiology

## 2017-11-26 ENCOUNTER — Telehealth (HOSPITAL_COMMUNITY): Payer: Self-pay | Admitting: *Deleted

## 2017-11-26 NOTE — Telephone Encounter (Signed)
Patient given detailed instructions per Myocardial Perfusion Study Information Sheet for the test on 12/01/17. Patient notified to arrive 15 minutes early and that it is imperative to arrive on time for appointment to keep from having the test rescheduled.  If you need to cancel or reschedule your appointment, please call the office within 24 hours of your appointment. . Patient verbalized understanding. Kirstie Peri

## 2017-12-01 ENCOUNTER — Other Ambulatory Visit: Payer: Self-pay

## 2017-12-01 ENCOUNTER — Other Ambulatory Visit: Payer: Medicare Other | Admitting: *Deleted

## 2017-12-01 ENCOUNTER — Ambulatory Visit (HOSPITAL_COMMUNITY): Payer: Medicare Other | Attending: Cardiology

## 2017-12-01 ENCOUNTER — Ambulatory Visit (HOSPITAL_BASED_OUTPATIENT_CLINIC_OR_DEPARTMENT_OTHER): Payer: Medicare Other

## 2017-12-01 DIAGNOSIS — E785 Hyperlipidemia, unspecified: Secondary | ICD-10-CM

## 2017-12-01 DIAGNOSIS — I10 Essential (primary) hypertension: Secondary | ICD-10-CM | POA: Diagnosis not present

## 2017-12-01 DIAGNOSIS — R079 Chest pain, unspecified: Secondary | ICD-10-CM | POA: Diagnosis not present

## 2017-12-01 DIAGNOSIS — I082 Rheumatic disorders of both aortic and tricuspid valves: Secondary | ICD-10-CM | POA: Diagnosis not present

## 2017-12-01 DIAGNOSIS — I2510 Atherosclerotic heart disease of native coronary artery without angina pectoris: Secondary | ICD-10-CM | POA: Insufficient documentation

## 2017-12-01 DIAGNOSIS — Z85118 Personal history of other malignant neoplasm of bronchus and lung: Secondary | ICD-10-CM | POA: Insufficient documentation

## 2017-12-01 DIAGNOSIS — R5383 Other fatigue: Secondary | ICD-10-CM | POA: Diagnosis not present

## 2017-12-01 DIAGNOSIS — I351 Nonrheumatic aortic (valve) insufficiency: Secondary | ICD-10-CM

## 2017-12-01 LAB — MYOCARDIAL PERFUSION IMAGING
LV dias vol: 53 mL (ref 46–106)
LV sys vol: 15 mL
Peak HR: 89 {beats}/min
RATE: 0.33
Rest HR: 69 {beats}/min
SDS: 2
SRS: 13
SSS: 15
TID: 1.23

## 2017-12-01 LAB — LIPID PANEL
Chol/HDL Ratio: 1.7 ratio (ref 0.0–4.4)
Cholesterol, Total: 161 mg/dL (ref 100–199)
HDL: 95 mg/dL (ref >39)
LDL Calculated: 59 mg/dL (ref 0–99)
Triglycerides: 33 mg/dL (ref 0–149)
VLDL Cholesterol Cal: 7 mg/dL (ref 5–40)

## 2017-12-01 LAB — HEPATIC FUNCTION PANEL
ALT: 19 IU/L (ref 0–32)
AST: 34 IU/L (ref 0–40)
Albumin: 4.2 g/dL (ref 3.5–4.7)
Alkaline Phosphatase: 42 IU/L (ref 39–117)
Bilirubin Total: 0.3 mg/dL (ref 0.0–1.2)
Bilirubin, Direct: 0.12 mg/dL (ref 0.00–0.40)
Total Protein: 6.5 g/dL (ref 6.0–8.5)

## 2017-12-01 LAB — ECHOCARDIOGRAM COMPLETE
Height: 62 in
Weight: 1728 oz

## 2017-12-01 MED ORDER — REGADENOSON 0.4 MG/5ML IV SOLN
0.4000 mg | Freq: Once | INTRAVENOUS | Status: AC
  Administered 2017-12-01: 0.4 mg via INTRAVENOUS

## 2017-12-01 MED ORDER — TECHNETIUM TC 99M TETROFOSMIN IV KIT
10.2000 | PACK | Freq: Once | INTRAVENOUS | Status: AC | PRN
  Administered 2017-12-01: 10.2 via INTRAVENOUS
  Filled 2017-12-01: qty 11

## 2017-12-01 MED ORDER — TECHNETIUM TC 99M TETROFOSMIN IV KIT
30.6000 | PACK | Freq: Once | INTRAVENOUS | Status: AC | PRN
  Administered 2017-12-01: 30.6 via INTRAVENOUS
  Filled 2017-12-01: qty 31

## 2017-12-02 ENCOUNTER — Other Ambulatory Visit: Payer: Self-pay | Admitting: Cardiology

## 2017-12-02 DIAGNOSIS — I10 Essential (primary) hypertension: Secondary | ICD-10-CM

## 2017-12-02 DIAGNOSIS — Z0000 Encounter for general adult medical examination without abnormal findings: Secondary | ICD-10-CM

## 2017-12-02 MED ORDER — ATORVASTATIN CALCIUM 20 MG PO TABS: 20.0000 mg | ORAL_TABLET | Freq: Every day | ORAL | 3 refills | Status: DC

## 2017-12-03 ENCOUNTER — Other Ambulatory Visit: Payer: Self-pay | Admitting: Cardiology

## 2017-12-03 ENCOUNTER — Telehealth: Payer: Self-pay

## 2017-12-03 DIAGNOSIS — Z79899 Other long term (current) drug therapy: Secondary | ICD-10-CM

## 2017-12-03 DIAGNOSIS — R079 Chest pain, unspecified: Secondary | ICD-10-CM

## 2017-12-03 MED ORDER — METOPROLOL TARTRATE 50 MG PO TABS: 50.0000 mg | ORAL_TABLET | Freq: Two times a day (BID) | ORAL | 0 refills | Status: DC

## 2017-12-03 NOTE — Addendum Note (Signed)
Addended by: Teressa Senter on: 12/03/2017 04:52 PM   Modules accepted: Orders

## 2017-12-03 NOTE — Telephone Encounter (Signed)
Notes recorded by Teressa Senter, RN on 12/03/2017 at 4:29 PM EDT I informed pt of Dr. Theodosia Blender recommendation to get a coronary CTA and CRP and sed rate to further assess chest pain symptoms. She is in agreement with plan, pt stated she can come in tomorrow for labs. Coronary CTA Instructions reviewed with pt. She stated understanding and thankful for the call   Notes recorded by Sueanne Margarita, MD on 12/03/2017 at 9:14 AM EDT Please order coronary CTA with FFR as well as CRP and sed rate

## 2017-12-04 ENCOUNTER — Other Ambulatory Visit: Payer: Medicare Other

## 2017-12-04 DIAGNOSIS — R079 Chest pain, unspecified: Secondary | ICD-10-CM | POA: Diagnosis not present

## 2017-12-05 LAB — C-REACTIVE PROTEIN: CRP: 0.3 mg/L (ref 0.0–4.9)

## 2017-12-05 LAB — SEDIMENTATION RATE: Sed Rate: 11 mm/hr (ref 0–40)

## 2017-12-29 ENCOUNTER — Other Ambulatory Visit: Payer: Self-pay

## 2017-12-29 DIAGNOSIS — R072 Precordial pain: Secondary | ICD-10-CM

## 2017-12-29 NOTE — Progress Notes (Signed)
Order placed for coronary cta per Dr. Radford Pax

## 2018-01-14 ENCOUNTER — Ambulatory Visit (HOSPITAL_COMMUNITY)
Admission: RE | Admit: 2018-01-14 | Discharge: 2018-01-14 | Disposition: A | Discharge status: Home / Self Care | Payer: Medicare Other | Source: Ambulatory Visit | Attending: Cardiology | Admitting: Cardiology

## 2018-01-14 DIAGNOSIS — I2510 Atherosclerotic heart disease of native coronary artery without angina pectoris: Secondary | ICD-10-CM | POA: Diagnosis not present

## 2018-01-14 DIAGNOSIS — I313 Pericardial effusion (noninflammatory): Secondary | ICD-10-CM | POA: Diagnosis not present

## 2018-01-14 DIAGNOSIS — I700 Atherosclerosis of aorta: Secondary | ICD-10-CM | POA: Insufficient documentation

## 2018-01-14 DIAGNOSIS — R072 Precordial pain: Secondary | ICD-10-CM

## 2018-01-14 DIAGNOSIS — R918 Other nonspecific abnormal finding of lung field: Secondary | ICD-10-CM | POA: Insufficient documentation

## 2018-01-14 LAB — POCT I-STAT CREATININE: Creatinine, Ser: 0.7 mg/dL (ref 0.44–1.00)

## 2018-01-14 MED ORDER — IOPAMIDOL (ISOVUE-370) INJECTION 76%
INTRAVENOUS | Status: AC
  Filled 2018-01-14: qty 100

## 2018-01-14 MED ORDER — METOPROLOL TARTRATE 5 MG/5ML IV SOLN
10.0000 mg | Freq: Once | INTRAVENOUS | Status: AC
  Administered 2018-01-14: 5 mg via INTRAVENOUS
  Filled 2018-01-14: qty 10

## 2018-01-14 MED ORDER — IOPAMIDOL (ISOVUE-370) INJECTION 76%
100.0000 mL | Freq: Once | INTRAVENOUS | Status: AC | PRN
  Administered 2018-01-14: 80 mL via INTRAVENOUS

## 2018-01-14 MED ORDER — NITROGLYCERIN 0.4 MG SL SUBL
0.8000 mg | SUBLINGUAL_TABLET | Freq: Once | SUBLINGUAL | Status: DC
  Filled 2018-01-14: qty 25

## 2018-01-14 MED ORDER — NITROGLYCERIN 0.4 MG SL SUBL
SUBLINGUAL_TABLET | SUBLINGUAL | Status: AC
  Filled 2018-01-14: qty 2

## 2018-01-14 MED ORDER — METOPROLOL TARTRATE 5 MG/5ML IV SOLN
INTRAVENOUS | Status: AC
  Administered 2018-01-14: 5 mg via INTRAVENOUS
  Filled 2018-01-14: qty 10

## 2018-01-18 ENCOUNTER — Encounter: Payer: Self-pay | Admitting: Cardiology

## 2018-01-18 DIAGNOSIS — I2510 Atherosclerotic heart disease of native coronary artery without angina pectoris: Secondary | ICD-10-CM | POA: Insufficient documentation

## 2018-01-19 ENCOUNTER — Telehealth: Payer: Self-pay

## 2018-01-19 MED ORDER — ISOSORBIDE MONONITRATE ER 30 MG PO TB24: 30.0000 mg | ORAL_TABLET | Freq: Every day | ORAL | 3 refills | Status: DC

## 2018-01-19 NOTE — Telephone Encounter (Signed)
Spoke with the patient about results of the cardiac CT. She expressed understanding and accepted taking Imdur 30 mg daily. An appointment was scheduled for 9/4 at 3:00 with Lyda Jester, PA. Awaiting response by PCP to start ASA 81 mg.   Notes recorded by Sueanne Margarita, MD on 01/18/2018 at 12:45 PM EDT Coronary CTA showed mild nonobstructive CAD of the ostial LM 0-25%, 50-69% prox LAD with no obstructive disease by FFR. Please have her start ASA 81mg  daily (if ok with her PCP) and Imdur 30mg  daily. Followup with PA in 4 weeks and PCP as well

## 2018-01-20 MED ORDER — ASPIRIN EC 81 MG PO TBEC: 81.0000 mg | DELAYED_RELEASE_TABLET | Freq: Every day | ORAL | 3 refills | Status: DC

## 2018-01-20 NOTE — Telephone Encounter (Signed)
Fine to start ASA 81 mg

## 2018-01-20 NOTE — Addendum Note (Signed)
Addended by: Shellia Cleverly on: 01/20/2018 01:05 PM   Modules accepted: Orders

## 2018-01-20 NOTE — Telephone Encounter (Signed)
Lm for pt ok to start ASA 81 mg daily per PCP.  Requested she c/b if any questions/concerns.

## 2018-01-23 ENCOUNTER — Telehealth: Payer: Self-pay | Admitting: Cardiology

## 2018-01-23 NOTE — Telephone Encounter (Signed)
New Message       Pt c/o medication issue:  1. Name of Medication: Isosorbide Nononbtrate 2. How are you currently taking this medication (dosage and times per day)? 1 time a day/30 mg  3. Are you having a reaction (difficulty breathing--STAT)? No   4. What is your medication issue? Medication is working for patient. Patient states she is having severe headaches.Has not taking any today.

## 2018-01-23 NOTE — Telephone Encounter (Signed)
Spoke with pt and she states that she is unable to tolerate the Imdur.  States it is causing severe HAs that are causing nausea.  Pt has taken Tylenol with little improvement.  Pt will not continue to take this medication.  Advised I would make Dr. Radford Pax aware and see if any further recommendations.

## 2018-01-23 NOTE — Telephone Encounter (Signed)
Please ask her if she is still having CP

## 2018-01-26 NOTE — Telephone Encounter (Signed)
Ok not to take the Imdu due to HA.  She will need to let us know if she develops and signifiacant pain

## 2018-01-26 NOTE — Telephone Encounter (Signed)
Pt advised per Dr. Radford Pax that it is okay to hold her Imdur. I advised her to let us know if she develops any chest pain and to let us know if her headache improves. Pt agrees.

## 2018-01-26 NOTE — Telephone Encounter (Signed)
Spoke with the pt and asked if she is still having CP, per Dr Radford Pax, and she said that she is not having chest pain, only chest pressure, sometimes in the afternoon. She states this is not on a daily basis.  Informed the pt that I will refer this message back to Dr Radford Pax for further review and recommendation. Informed the pt that a triage nurse will follow-up with her shortly thereafter, once recommendations received. Pt verbalized understanding and agrees with this plan.

## 2018-02-03 ENCOUNTER — Telehealth: Payer: Self-pay

## 2018-02-03 NOTE — Telephone Encounter (Signed)
Notes recorded by Frederik Schmidt, RN on 02/03/2018 at 9:23 AM EDT Patient has f/u with B. Rosita Fire 9/4 ------

## 2018-02-03 NOTE — Telephone Encounter (Signed)
-----   Message from Hoyt Koch, MD sent at 02/03/2018  8:30 AM EDT ----- Please have patient make apt with [place] to discuss. Thanks, Dr. Sharlet Salina ----- Message ----- From: Frederik Schmidt, RN Sent: 01/29/2018   9:45 AM To: Hoyt Koch, MD

## 2018-02-14 DIAGNOSIS — Z23 Encounter for immunization: Secondary | ICD-10-CM | POA: Diagnosis not present

## 2018-03-04 ENCOUNTER — Ambulatory Visit (INDEPENDENT_AMBULATORY_CARE_PROVIDER_SITE_OTHER): Payer: Medicare Other | Admitting: Cardiology

## 2018-03-04 ENCOUNTER — Encounter (INDEPENDENT_AMBULATORY_CARE_PROVIDER_SITE_OTHER): Payer: Self-pay

## 2018-03-04 ENCOUNTER — Encounter: Payer: Self-pay | Admitting: Cardiology

## 2018-03-04 VITALS — BP 102/64 | HR 126 | Ht 62.0 in | Wt 110.4 lb

## 2018-03-04 DIAGNOSIS — Z5181 Encounter for therapeutic drug level monitoring: Secondary | ICD-10-CM | POA: Diagnosis not present

## 2018-03-04 DIAGNOSIS — I2510 Atherosclerotic heart disease of native coronary artery without angina pectoris: Secondary | ICD-10-CM

## 2018-03-04 DIAGNOSIS — I4891 Unspecified atrial fibrillation: Secondary | ICD-10-CM | POA: Diagnosis not present

## 2018-03-04 MED ORDER — DILTIAZEM HCL ER COATED BEADS 120 MG PO CP24: 120.0000 mg | ORAL_CAPSULE | Freq: Every day | ORAL | 3 refills | Status: DC

## 2018-03-04 MED ORDER — APIXABAN 2.5 MG PO TABS: 2.5000 mg | ORAL_TABLET | Freq: Two times a day (BID) | ORAL | 6 refills | Status: DC

## 2018-03-04 NOTE — Progress Notes (Addendum)
03/04/2018 Cynthia Vargas   05/21/1932  [id number]  Primary Physician Hoyt Koch, MD Primary Cardiologist: Dr. Radford Pax  Electrophysiologist: N/A  Reason for Visit/CC: Exertional CP; new diagnosis of atrial fibrillation  HPI: Cynthia Vargas is a 84 y.o. female with a hx of HTN, nonobstructive CAD,stage IIB carcinoma of the lung,mild to [facility] and moderatepericardial effusion with acute pericarditis, diagnosed in April 2017 and treated with NSAIDs and colchicine.  She was treated for 3 months on colchicine.  She had a f/u echo done in May 2017 which showed improvement in effusion with only a trivial amount posterior to the heart. She has been followed by Dr. Radford Pax and last seen by her in May of this year. At that visit, pt complained of episodes of exertional CP that resolved with rest. Subsequently, Dr. Radford Pax ordered for her to get a repeat echo. Echo done June 2019 showed normal LV function w/ EF 60 to 65% with normal diastolic function and mild aortic insufficiency. There is mild to moderate TR. There was also no pericardial effusion noted. It was then recommended that she get a coronary CTA to evaluate for coronary artery disease. CTA 01/14/18 showed coronary calcium score of 127 and mild CAD in the ostial LM and moderate CAD in the proximal LAD. This was sent for [facility] analysis which was negative for any significant stenosis. However given her symptoms, Dr. Radford Pax recommended addition of Imdur. Pt was unable to tolerate and discontinued medication. She is back today for f/u.   EKG shows new diagnosis of atrial fibrillation w/ RVR. HR 126 bpm. BP is soft at 102/64. She is symptomatic currently, but continues to have mild exertional CP and decreased exercise tolerance. No dizziness, syncope/ near syncope. She denies any prior history of stroke or TIA. No history of abnormal bleeding. No falls. She drinks a heavy amount of caffeine, ~4 cups of coffee in the AM and ~ 4 cups of  tea in the evening. She occasionally will have a glass of wine, but not daily. Of note, recent labs 01/15/18 showed normal SCr at 0.70.   Current Meds  Medication Sig  . acetaminophen (TYLENOL) 500 MG tablet Take 1,000 mg by mouth 2 (two) times daily as needed for moderate pain.   [name] atorvastatin (LIPITOR) 20 MG tablet Take 1 tablet (20 mg total) by mouth daily.  . Multiple Vitamins-Minerals (MULTIVITAMIN PO) Take 1 tablet by mouth.  . nitroGLYCERIN (NITROSTAT) 0.4 MG SL tablet Place 0.4 mg under the tongue every 5 (five) minutes as needed for chest pain.  . [DISCONTINUED] amLODipine (NORVASC) 5 MG tablet TAKE 1 TABLET(5 MG) BY MOUTH DAILY  . [DISCONTINUED] aspirin EC 81 MG tablet Take 1 tablet (81 mg total) by mouth daily.   Allergies  Allergen Reactions  . Peanut-Containing Drug Products Other (See Comments)    angioedema   Past Medical History:  Diagnosis Date  . Acute pericarditis 11/01/2015   a. moderate pericardial effusion by echo 09/2015 - signficantly improved with trivial effusion on repeat echo after NSAIDS and colchicine 10/2015.  [name] Aortic insufficiency   . CAD (coronary artery disease), native coronary artery    0-25% ostial LM and 40-69% pLAD with normal FFR by coronary CTA 12/2017  . CALCANEAL FRACTURE, RIGHT 11/04/2007  . Essential hypertension   . GERD (gastroesophageal reflux disease)   . Hyperlipidemia LDL goal <70 02/08/2016  . KNEE PAIN, RIGHT, CHRONIC 03/19/2010  . Lung nodule    a. found on CT 2017 - possible  stage IIB carcinoma.  . Mitral regurgitation   . Nonobstructive atherosclerosis of coronary artery   . OSTEOPOROSIS 05/02/2007  . PEPTIC ULCER DISEASE 05/02/2007   Family History  Problem Relation Age of Onset  . Cancer Father   . Heart disease Mother   . Diabetes Neg Hx   . Hyperlipidemia Neg Hx   . Hypertension Neg Hx    Past Surgical History:  Procedure Laterality Date  . Calcaneal fracture, right    . CHOLECYSTECTOMY    . DILATION AND CURETTAGE OF  UTERUS    . ESOPHAGOGASTRODUODENOSCOPY N/A 03/30/2013   Procedure: ESOPHAGOGASTRODUODENOSCOPY (EGD);  Surgeon: Jerene Bears, MD;  Location: [facility];  Service: Endoscopy;  Laterality: N/A;  . FUDUCIAL PLACEMENT N/A 09/29/2015   Procedure:  PLACEMENT OF FUDUCIAL Marker times three;  Surgeon: Grace Isaac, MD;  Location: [place];  Service: Thoracic;  Laterality: N/A;  . LUNG BIOPSY N/A 09/29/2015   Procedure: LUNG BIOPSY;  Surgeon: Grace Isaac, MD;  Location: [place];  Service: Thoracic;  Laterality: N/A;  . ORIF HIP FRACTURE    . TONSILLECTOMY    . VIDEO BRONCHOSCOPY WITH ENDOBRONCHIAL NAVIGATION N/A 09/29/2015   Procedure: VIDEO BRONCHOSCOPY WITH ENDOBRONCHIAL NAVIGATION;  Surgeon: Grace Isaac, MD;  Location: [facility];  Service: Thoracic;  Laterality: N/A;   Social History   Socioeconomic History  . Marital status: Single    Spouse name: Not on file  . Number of children: 1  . Years of education: 62  . Highest education level: Not on file  Occupational History  . Occupation: reitred  Social Needs  . Financial resource strain: Not on file  . Food insecurity:    Worry: Not on file    Inability: Not on file  . Transportation needs:    Medical: Not on file    Non-medical: Not on file  Tobacco Use  . Smoking status: Never Smoker  . Smokeless tobacco: Never Used  Substance and Sexual Activity  . Alcohol use: Yes    Alcohol/week: 1.0 standard drinks    Types: 1 Glasses of wine per week  . Drug use: No  . Sexual activity: Never  Lifestyle  . Physical activity:    Days per week: Not on file    Minutes per session: Not on file  . Stress: Not on file  Relationships  . Social connections:    Talks on phone: Not on file    Gets together: Not on file    Attends religious service: Not on file    Active member of club or organization: Not on file    Attends meetings of clubs or organizations: Not on file    Relationship status: Not on file  . Intimate partner violence:      Fear of current or ex partner: Not on file    Emotionally abused: Not on file    Physically abused: Not on file    Forced sexual activity: Not on file  Other Topics Concern  . Not on file  Social History Narrative   [employer] degrees in Development worker, community. married for 2 years then single . 1 son - Harvie Junior [place]. 2 daughters-put up for adoption. Lives alone and is independent in ADL's. works at [employer] until injured.     Review of Systems: General: negative for chills, fever, night sweats or weight changes.  Cardiovascular: negative for chest pain, dyspnea on exertion, edema, orthopnea, palpitations, paroxysmal nocturnal dyspnea or shortness of breath Dermatological: negative for  rash Respiratory: negative for cough or wheezing Urologic: negative for hematuria Abdominal: negative for nausea, vomiting, diarrhea, bright red blood per rectum, melena, or hematemesis Neurologic: negative for visual changes, syncope, or dizziness All other systems reviewed and are otherwise negative except as noted above.   Physical Exam:  Blood pressure 102/64, pulse (!) 126, height 5\' 2"  (1.575 m), weight 110 lb 6.4 oz (50.1 kg), SpO2 94 %.  General appearance: elderly WF in no distress, alert, and cooperative  Neck: no carotid bruit and no JVD Lungs: clear to auscultation bilaterally Heart: irregularly irregular rhythm and tachy rate Extremities: extremities normal, atraumatic, no cyanosis or edema Pulses: 2+ and symmetric Skin: Skin color, texture, turgor normal. No rashes or lesions Neurologic: Grossly normal  EKG atrial fibrillation w/ RVR 126 -- personally reviewed   ASSESSMENT AND PLAN:   1. New Onset Atrial Fibrillation w/ RVR: pt symptomatic with exertional CP. Recent coronary CTA with nonobstructive CAD and recent echo with normal LVEF. LA normal size. EKG shows Vrate of 126 bpm. BP is soft but stable at 102/64. She is asymptomatic at rest and physical exam negative for signs of acute CHF.  No LEE. Lungs are CTAB. I have reviewed EKG and discussed case with Dr. Burt Knack, DOD. We will stop amlodipine and change to Cardizem 120 mg daily. We will stop ASA and add Eliquis for anticoagulation, low dose of 2.5 mg BID based on dosing criteria using age and weight (>80 y/o and <60 kg). We will obtain CBC and BMP to check H/H and renal function (recent SCr from 12/2017 normal). Will also check TSH. Pt advised to reduce caffeine consumption and to refrain from NSAIDs while on Eliquis. She will notify us if any abnormal bleeding or falls. She will f/u in 3 weeks for repeat EKG and if still in atrial fibrillation, we will set up for outpatient DCCV. Pt will notify us if her symptoms worsen between now and her f/u visit.   2. Nonobstructive CAD: recent Coronary CTA w/ FFR analysis negative for any significant stenosis. Continue Lipitor. We will discontinue ASA given addition of Eliquis for afib.    Follow-Up in 3 weeks.   Cynthia Vargas, MHS [facility] 03/04/2018 4:28 PM

## 2018-03-04 NOTE — Patient Instructions (Addendum)
Medication Instructions:    STOP TAKING:    1. ASPIRIN 81    2. AMLODIPINE 5 MG    3. DON'T USE IBUPROFEN    START TAKING:    1. CARDIZEM 120 MG ONCE A DAY   2. ELIQUIS 2.5 MG  TWICE A  A DAY    3.  USE TYLENOL  AS  NEEDED FOR PAIN   If you need a refill on your cardiac medications before your next appointment, please call your pharmacy.  Labwork:  RETURN IN 10 DAYS FOR CBC BMET AND TSH    Testing/Procedures: NONE ORDERED  TODAY    Follow-Up: IN 3 WEEKS WITH  SIMMONS OR TURNER WITH A REPEAT EKG   Any Other Special Instructions Will Be Listed Below (If Applicable).  CUT BACK ON CAFFEINE AND ALCOHOL INTAKE    Atrial Fibrillation Atrial fibrillation is a type of heartbeat that is irregular or fast (rapid). If you have this condition, your heart keeps quivering in a weird (chaotic) way. This condition can make it so your heart cannot pump blood normally. Having this condition gives a person more risk for stroke, heart failure, and other heart problems. There are different types of atrial fibrillation. Talk with your doctor to learn about the type that you have. Follow these instructions at home:  Take over-the-counter and prescription medicines only as told by your doctor.  If your doctor prescribed a blood-thinning medicine, take it exactly as told. Taking too much of it can cause bleeding. If you do not take enough of it, you will not have the protection that you need against stroke and other problems.  Do not use any tobacco products. These include cigarettes, chewing tobacco, and e-cigarettes. If you need help quitting, ask your doctor.  If you have apnea (obstructive sleep apnea), manage it as told by your doctor.  Do not drink alcohol.  Do not drink beverages that have caffeine. These include coffee, soda, and tea.  Maintain a healthy weight. Do not use diet pills unless your doctor says they are safe for you. Diet pills may make heart problems worse.  Follow  diet instructions as told by your doctor.  Exercise regularly as told by your doctor.  Keep all follow-up visits as told by your doctor. This is important. Contact a doctor if:  You notice a change in the speed, rhythm, or strength of your heartbeat.  You are taking a blood-thinning medicine and you notice more bruising.  You get tired more easily when you move or exercise. Get help right away if:  You have pain in your chest or your belly (abdomen).  You have sweating or weakness.  You feel sick to your stomach (nauseous).  You notice blood in your throw up (vomit), poop (stool), or pee (urine).  You are short of breath.  You suddenly have swollen feet and ankles.  You feel dizzy.  Your suddenly get weak or numb in your face, arms, or legs, especially if it happens on one side of your body.  You have trouble talking, trouble understanding, or both.  Your face or your eyelid droops on one side. These symptoms may be an emergency. Do not wait to see if the symptoms will go away. Get medical help right away. Call your local emergency services (911 in the [place] Do not drive yourself to the hospital. This information is not intended to replace advice given to you by your health care provider. Make sure you discuss any questions you  have with your health care provider. Document Released: 03/26/2008 Document Revised: 11/23/2015 Document Reviewed: 10/12/2014 Elsevier Interactive Patient Education  [employer].

## 2018-03-13 NOTE — Progress Notes (Signed)
Subjective:   Cynthia Vargas is a 84 y.o. female who presents for Medicare Annual (Subsequent) preventive examination.  Review of Systems:  No ROS.  Medicare Wellness Visit. Additional risk factors are reflected in the social history.  Cardiac Risk Factors include: advanced age (>49men, >49 women);dyslipidemia;hypertension Sleep patterns: gets up 1-2 times nightly to void and sleeps 8-9 hours nightly.    Home Safety/Smoke Alarms: Feels safe in home. Smoke alarms in place.  Living environment; residence and Firearm Safety: 1-story house/ trailer, equipment: Radio producer, Type: [place], no firearms. Lives alone, no needs for DME, good support system Seat Belt Safety/Bike Helmet: Wears seat belt.     Objective:     Vitals: BP 127/68   Pulse 67   Resp 17   Ht 5\' 2"  (1.575 m)   Wt 106 lb (48.1 kg)   SpO2 99%   BMI 19.39 kg/m   Body mass index is 19.39 kg/m.  Advanced Directives 03/16/2018 03/10/2017 09/28/2015 03/30/2013 03/30/2013  Does Patient Have a Medical Advance Directive? No No No Patient does not have advance directive Patient does not have advance directive;Patient would like information  Does patient want to make changes to medical advance directive? Yes (ED - Information included in AVS) - - - -  Would patient like information on creating a medical advance directive? - Yes (ED - Information included in AVS) No - patient declined information - -    Tobacco Social History   Tobacco Use  Smoking Status Never Smoker  Smokeless Tobacco Never Used     Counseling given: Not Answered  Past Medical History:  Diagnosis Date  . Acute pericarditis 11/01/2015   a. moderate pericardial effusion by echo 09/2015 - signficantly improved with trivial effusion on repeat echo after NSAIDS and colchicine 10/2015.  [name] Aortic insufficiency   . CAD (coronary artery disease), native coronary artery    0-25% ostial LM and 40-69% pLAD with normal FFR by coronary CTA 12/2017  . CALCANEAL  FRACTURE, RIGHT 11/04/2007  . Essential hypertension   . GERD (gastroesophageal reflux disease)   . Hyperlipidemia LDL goal <70 02/08/2016  . KNEE PAIN, RIGHT, CHRONIC 03/19/2010  . Lung nodule    a. found on CT 2017 - possible stage IIB carcinoma.  . Mitral regurgitation   . Nonobstructive atherosclerosis of coronary artery   . OSTEOPOROSIS 05/02/2007  . PEPTIC ULCER DISEASE 05/02/2007   Past Surgical History:  Procedure Laterality Date  . Calcaneal fracture, right    . CHOLECYSTECTOMY    . DILATION AND CURETTAGE OF UTERUS    . ESOPHAGOGASTRODUODENOSCOPY N/A 03/30/2013   Procedure: ESOPHAGOGASTRODUODENOSCOPY (EGD);  Surgeon: Jerene Bears, MD;  Location: [facility];  Service: Endoscopy;  Laterality: N/A;  . FUDUCIAL PLACEMENT N/A 09/29/2015   Procedure:  PLACEMENT OF FUDUCIAL Marker times three;  Surgeon: Grace Isaac, MD;  Location: [facility];  Service: Thoracic;  Laterality: N/A;  . LUNG BIOPSY N/A 09/29/2015   Procedure: LUNG BIOPSY;  Surgeon: Grace Isaac, MD;  Location: [facility];  Service: Thoracic;  Laterality: N/A;  . ORIF HIP FRACTURE    . TONSILLECTOMY    . VIDEO BRONCHOSCOPY WITH ENDOBRONCHIAL NAVIGATION N/A 09/29/2015   Procedure: VIDEO BRONCHOSCOPY WITH ENDOBRONCHIAL NAVIGATION;  Surgeon: Grace Isaac, MD;  Location: [facility];  Service: Thoracic;  Laterality: N/A;   Family History  Problem Relation Age of Onset  . Cancer Father   . Heart disease Mother   . Diabetes Neg Hx   .  Hyperlipidemia Neg Hx   . Hypertension Neg Hx    Social History   Socioeconomic History  . Marital status: Single    Spouse name: Not on file  . Number of children: 1  . Years of education: 18  . Highest education level: Not on file  Occupational History  . Occupation: reitred  Social Needs  . Financial resource strain: Not hard at all  . Food insecurity:    Worry: Never true    Inability: Never true  . Transportation needs:    Medical: No    Non-medical: No  Tobacco Use  . Smoking  status: Never Smoker  . Smokeless tobacco: Never Used  Substance and Sexual Activity  . Alcohol use: Yes    Alcohol/week: 1.0 standard drinks    Types: 1 Glasses of wine per week  . Drug use: No  . Sexual activity: Never  Lifestyle  . Physical activity:    Days per week: 4 days    Minutes per session: 40 min  . Stress: Not at all  Relationships  . Social connections:    Talks on phone: More than three times a week    Gets together: More than three times a week    Attends religious service: 1 to 4 times per year    Active member of club or organization: Yes    Attends meetings of clubs or organizations: More than 4 times per year    Relationship status: Not on file  Other Topics Concern  . Not on file  Social History Narrative   [employer] degrees in Development worker, community. married for 2 years then single . 1 son - Harvie Junior [place]. 2 daughters-put up for adoption. Lives alone and is independent in ADL's. works at [employer] until injured.    Outpatient Encounter Medications as of 03/16/2018  Medication Sig  . acetaminophen (TYLENOL) 500 MG tablet Take 1,000 mg by mouth 2 (two) times daily as needed for moderate pain.   [name] apixaban (ELIQUIS) 2.5 MG TABS tablet Take 1 tablet (2.5 mg total) by mouth 2 (two) times daily.  [name] atorvastatin (LIPITOR) 20 MG tablet Take 1 tablet (20 mg total) by mouth daily.  [name] diltiazem (CARDIZEM CD) 120 MG 24 hr capsule Take 1 capsule (120 mg total) by mouth daily.  . Multiple Vitamins-Minerals (MULTIVITAMIN PO) Take 1 tablet by mouth.  . nitroGLYCERIN (NITROSTAT) 0.4 MG SL tablet Place 0.4 mg under the tongue every 5 (five) minutes as needed for chest pain.   No facility-administered encounter medications on file as of 03/16/2018.     Activities of Daily Living In your present state of health, do you have any difficulty performing the following activities: 03/16/2018  Hearing? N  Vision? N  Difficulty concentrating or making decisions? N  Walking or climbing  stairs? N  Dressing or bathing? N  Doing errands, shopping? N  Preparing Food and eating ? N  Using the Toilet? N  In the past six months, have you accidently leaked urine? Y  Do you have problems with loss of bowel control? N  Managing your Medications? N  Managing your Finances? N  Housekeeping or managing your Housekeeping? N  Some recent data might be hidden    Patient Care Team: Hoyt Koch, MD as PCP - General (Internal Medicine) Sueanne Margarita, MD as PCP - Cardiology (Cardiology) Sueanne Margarita, MD as Consulting Physician (Cardiology)    Assessment:   This is a routine wellness examination for [place]. Physical  assessment deferred to PCP.  Exercise Activities and Dietary recommendations Current Exercise Habits: Home exercise routine, Type of exercise: walking, Time (Minutes): 30, Frequency (Times/Week): 6, Weekly Exercise (Minutes/Week): 180, Intensity: Mild, Exercise limited by: orthopedic condition(s)  Diet (meal preparation, eat out, water intake, caffeinated beverages, dairy products, fruits and vegetables): in general, a "healthy" diet  , well balanced   Reviewed heart healthy diet. Encouraged patient to increase daily water and healthy fluid intake.  Goals    . Be as active and as independent as possible     Enjoy  life, go to the park, travel, and walk my dog.     . Patient Stated     I would like to go and tee off at the golf course, go to the park and paint, make portraits of my dog and cat.       Fall Risk Fall Risk  03/16/2018 03/10/2017 01/20/2017  Falls in the past year? Yes No No  Comment - - Emmi Telephone Survey: data to providers prior to load  Number falls in past yr: 1 - -  Injury with Fall? No - -  Risk for fall due to : Impaired mobility;Impaired balance/gait - -  Follow up Falls prevention discussed - -   Depression Screen PHQ 2/9 Scores 03/16/2018 03/10/2017  PHQ - 2 Score 0 0  PHQ- 9 Score 3 3     Cognitive Function MMSE -  Mini Mental State Exam 03/16/2018 03/10/2017  Not completed: Refused -  Orientation to time - 5  Orientation to Place - 5  Registration - 3  Attention/ Calculation - 5  Recall - 1  Language- name 2 objects - 2  Language- repeat - 1  Language- follow 3 step command - 3  Language- read & follow direction - 1  Write a sentence - 1  Copy design - 1  Total score - 28       Ad8 score reviewed for issues:  Issues making decisions: no  Less interest in hobbies / activities: no  Repeats questions, stories (family complaining): no  Trouble using ordinary gadgets (microwave, computer, phone):no  Forgets the month or year: no  Mismanaging finances: no  Remembering appts: no  Daily problems with thinking and/or memory: no Ad8 score is= 0  Immunization History  Administered Date(s) Administered  . Influenza Whole 03/19/2010  . Influenza, High Dose Seasonal PF 07/04/2016, 03/10/2017, 03/16/2018  . Influenza, Seasonal, Injecte, Preservative Fre 08/03/2012  . Influenza,inj,Quad PF,6+ Mos 03/24/2013  . Influenza-Unspecified 07/19/2013  . Tetanus 08/03/2012   Screening Tests Health Maintenance  Topic Date Due  . DEXA SCAN  01/20/1999  . PNA vac Low Risk Adult (1 of 2 - PCV13) 01/20/1999  . INFLUENZA VACCINE  01/29/2018  . TETANUS/TDAP  08/03/2022      Plan:     Continue doing brain stimulating activities (puzzles, reading, adult coloring books, staying active) to keep memory sharp.   Continue to eat heart healthy diet (full of fruits, vegetables, whole grains, lean protein, water--limit salt, fat, and sugar intake) and increase physical activity as tolerated.  I have personally reviewed and noted the following in the patient's chart:   . Medical and social history . Use of alcohol, tobacco or illicit drugs  . Current medications and supplements . Functional ability and status . Nutritional status . Physical activity . Advanced directives . List of other  physicians . Vitals . Screenings to include cognitive, depression, and falls . Referrals and appointments  In  addition, I have reviewed and discussed with patient certain preventive protocols, quality metrics, and best practice recommendations. A written personalized care plan for preventive services as well as general preventive health recommendations were provided to patient.     Michiel Cowboy, RN  03/16/2018

## 2018-03-16 ENCOUNTER — Other Ambulatory Visit: Payer: Self-pay | Admitting: *Deleted

## 2018-03-16 ENCOUNTER — Other Ambulatory Visit: Payer: Medicare Other | Admitting: *Deleted

## 2018-03-16 ENCOUNTER — Ambulatory Visit (INDEPENDENT_AMBULATORY_CARE_PROVIDER_SITE_OTHER): Payer: Medicare Other | Admitting: *Deleted

## 2018-03-16 VITALS — BP 127/68 | HR 67 | Resp 17 | Ht 62.0 in | Wt 106.0 lb

## 2018-03-16 DIAGNOSIS — Z23 Encounter for immunization: Secondary | ICD-10-CM | POA: Diagnosis not present

## 2018-03-16 DIAGNOSIS — I4891 Unspecified atrial fibrillation: Secondary | ICD-10-CM | POA: Diagnosis not present

## 2018-03-16 DIAGNOSIS — Z0000 Encounter for general adult medical examination without abnormal findings: Secondary | ICD-10-CM

## 2018-03-16 DIAGNOSIS — E2839 Other primary ovarian failure: Secondary | ICD-10-CM | POA: Diagnosis not present

## 2018-03-16 DIAGNOSIS — Z5181 Encounter for therapeutic drug level monitoring: Secondary | ICD-10-CM

## 2018-03-16 NOTE — Patient Instructions (Addendum)
Continue doing brain stimulating activities (puzzles, reading, adult coloring books, staying active) to keep memory sharp.   Continue to eat heart healthy diet (full of fruits, vegetables, whole grains, lean protein, water--limit salt, fat, and sugar intake) and increase physical activity as tolerated.  Cynthia Vargas , Thank you for taking time to come for your Medicare Wellness Visit. I appreciate your ongoing commitment to your health goals. Please review the following plan we discussed and let me know if I can assist you in the future.   These are the goals we discussed: Goals    . Be as active and as independent as possible     Enjoy  life, go to the park, travel, and walk my dog.     . Patient Stated     I would like to go and tee off at the golf course, go to the park and paint, make portraits of my dog and cat.       This is a list of the screening recommended for you and due dates:  Health Maintenance  Topic Date Due  . DEXA scan (bone density measurement)  01/20/1999  . Pneumonia vaccines (1 of 2 - PCV13) 01/20/1999  . Flu Shot  01/29/2018  . Tetanus Vaccine  08/03/2022   Health Maintenance, Female Adopting a healthy lifestyle and getting preventive care can go a long way to promote health and wellness. Talk with your health care provider about what schedule of regular examinations is right for you. This is a good chance for you to check in with your provider about disease prevention and staying healthy. In between checkups, there are plenty of things you can do on your own. Experts have done a lot of research about which lifestyle changes and preventive measures are most likely to keep you healthy. Ask your health care provider for more information. Weight and diet Eat a healthy diet  Be sure to include plenty of vegetables, fruits, low-fat dairy products, and lean protein.  Do not eat a lot of foods high in solid fats, added sugars, or salt.  Get regular exercise. This is  one of the most important things you can do for your health. ? Most adults should exercise for at least 150 minutes each week. The exercise should increase your heart rate and make you sweat (moderate-intensity exercise). ? Most adults should also do strengthening exercises at least twice a week. This is in addition to the moderate-intensity exercise.  Maintain a healthy weight  Body mass index (BMI) is a measurement that can be used to identify possible weight problems. It estimates body fat based on height and weight. Your health care provider can help determine your BMI and help you achieve or maintain a healthy weight.  For females 36 years of age and older: ? A BMI below 18.5 is considered underweight. ? A BMI of 18.5 to 24.9 is normal. ? A BMI of 25 to 29.9 is considered overweight. ? A BMI of 30 and above is considered obese.  Watch levels of cholesterol and blood lipids  You should start having your blood tested for lipids and cholesterol at 82 years of age, then have this test every 5 years.  You may need to have your cholesterol levels checked more often if: ? Your lipid or cholesterol levels are high. ? You are older than 82 years of age. ? You are at high risk for heart disease.  Cancer screening Lung Cancer  Lung cancer screening is recommended for  adults 85-74 years old who are at high risk for lung cancer because of a history of smoking.  A yearly low-dose CT scan of the lungs is recommended for people who: ? Currently smoke. ? Have quit within the past 15 years. ? Have at least a 30-pack-year history of smoking. A pack year is smoking an average of one pack of cigarettes a day for 1 year.  Yearly screening should continue until it has been 15 years since you quit.  Yearly screening should stop if you develop a health problem that would prevent you from having lung cancer treatment.  Breast Cancer  Practice breast self-awareness. This means understanding how your  breasts normally appear and feel.  It also means doing regular breast self-exams. Let your health care provider know about any changes, no matter how small.  If you are in your 20s or 30s, you should have a clinical breast exam (CBE) by a health care provider every 1-3 years as part of a regular health exam.  If you are 57 or older, have a CBE every year. Also consider having a breast X-ray (mammogram) every year.  If you have a family history of breast cancer, talk to your health care provider about genetic screening.  If you are at high risk for breast cancer, talk to your health care provider about having an MRI and a mammogram every year.  Breast cancer gene (BRCA) assessment is recommended for women who have family members with BRCA-related cancers. BRCA-related cancers include: ? Breast. ? Ovarian. ? Tubal. ? Peritoneal cancers.  Results of the assessment will determine the need for genetic counseling and BRCA1 and BRCA2 testing.  Cervical Cancer Your health care provider may recommend that you be screened regularly for cancer of the pelvic organs (ovaries, uterus, and vagina). This screening involves a pelvic examination, including checking for microscopic changes to the surface of your cervix (Pap test). You may be encouraged to have this screening done every 3 years, beginning at age 15.  For women ages 65-65, health care providers may recommend pelvic exams and Pap testing every 3 years, or they may recommend the Pap and pelvic exam, combined with testing for human papilloma virus (HPV), every 5 years. Some types of HPV increase your risk of cervical cancer. Testing for HPV may also be done on women of any age with unclear Pap test results.  Other health care providers may not recommend any screening for nonpregnant women who are considered low risk for pelvic cancer and who do not have symptoms. Ask your health care provider if a screening pelvic exam is right for you.  If you  have had past treatment for cervical cancer or a condition that could lead to cancer, you need Pap tests and screening for cancer for at least 20 years after your treatment. If Pap tests have been discontinued, your risk factors (such as having a new sexual partner) need to be reassessed to determine if screening should resume. Some women have medical problems that increase the chance of getting cervical cancer. In these cases, your health care provider may recommend more frequent screening and Pap tests.  Colorectal Cancer  This type of cancer can be detected and often prevented.  Routine colorectal cancer screening usually begins at 82 years of age and continues through 82 years of age.  Your health care provider may recommend screening at an earlier age if you have risk factors for colon cancer.  Your health care provider may also recommend  using home test kits to check for hidden blood in the stool.  A small camera at the end of a tube can be used to examine your colon directly (sigmoidoscopy or colonoscopy). This is done to check for the earliest forms of colorectal cancer.  Routine screening usually begins at age 74.  Direct examination of the colon should be repeated every 5-10 years through 82 years of age. However, you may need to be screened more often if early forms of precancerous polyps or small growths are found.  Skin Cancer  Check your skin from head to toe regularly.  Tell your health care provider about any new moles or changes in moles, especially if there is a change in a mole's shape or color.  Also tell your health care provider if you have a mole that is larger than the size of a pencil eraser.  Always use sunscreen. Apply sunscreen liberally and repeatedly throughout the day.  Protect yourself by wearing long sleeves, pants, a wide-brimmed hat, and sunglasses whenever you are outside.  Heart disease, diabetes, and high blood pressure  High blood pressure causes  heart disease and increases the risk of stroke. High blood pressure is more likely to develop in: ? People who have blood pressure in the high end of the normal range (130-139/85-89 mm Hg). ? People who are overweight or obese. ? People who are African American.  If you are 55-56 years of age, have your blood pressure checked every 3-5 years. If you are 49 years of age or older, have your blood pressure checked every year. You should have your blood pressure measured twice-once when you are at a hospital or clinic, and once when you are not at a hospital or clinic. Record the average of the two measurements. To check your blood pressure when you are not at a hospital or clinic, you can use: ? An automated blood pressure machine at a pharmacy. ? A home blood pressure monitor.  If you are between 91 years and 70 years old, ask your health care provider if you should take aspirin to prevent strokes.  Have regular diabetes screenings. This involves taking a blood sample to check your fasting blood sugar level. ? If you are at a normal weight and have a low risk for diabetes, have this test once every three years after 82 years of age. ? If you are overweight and have a high risk for diabetes, consider being tested at a younger age or more often. Preventing infection Hepatitis B  If you have a higher risk for hepatitis B, you should be screened for this virus. You are considered at high risk for hepatitis B if: ? You were born in a country where hepatitis B is common. Ask your health care provider which countries are considered high risk. ? Your parents were born in a high-risk country, and you have not been immunized against hepatitis B (hepatitis B vaccine). ? You have HIV or AIDS. ? You use needles to inject street drugs. ? You live with someone who has hepatitis B. ? You have had sex with someone who has hepatitis B. ? You get hemodialysis treatment. ? You take certain medicines for  conditions, including cancer, organ transplantation, and autoimmune conditions.  Hepatitis C  Blood testing is recommended for: ? Everyone born from [birth year] through 1963. ? Anyone with known risk factors for hepatitis C.  Sexually transmitted infections (STIs)  You should be screened for sexually transmitted infections (STIs) including gonorrhea  and chlamydia if: ? You are sexually active and are younger than 82 years of age. ? You are older than 82 years of age and your health care provider tells you that you are at risk for this type of infection. ? Your sexual activity has changed since you were last screened and you are at an increased risk for chlamydia or gonorrhea. Ask your health care provider if you are at risk.  If you do not have HIV, but are at risk, it may be recommended that you take a prescription medicine daily to prevent HIV infection. This is called pre-exposure prophylaxis (PrEP). You are considered at risk if: ? You are sexually active and do not regularly use condoms or know the HIV status of your partner(s). ? You take drugs by injection. ? You are sexually active with a partner who has HIV.  Talk with your health care provider about whether you are at high risk of being infected with HIV. If you choose to begin PrEP, you should first be tested for HIV. You should then be tested every 3 months for as long as you are taking PrEP. Pregnancy  If you are premenopausal and you may become pregnant, ask your health care provider about preconception counseling.  If you may become pregnant, take 400 to 800 micrograms (mcg) of folic acid every day.  If you want to prevent pregnancy, talk to your health care provider about birth control (contraception). Osteoporosis and menopause  Osteoporosis is a disease in which the bones lose minerals and strength with aging. This can result in serious bone fractures. Your risk for osteoporosis can be identified using a bone density  scan.  If you are 61 years of age or older, or if you are at risk for osteoporosis and fractures, ask your health care provider if you should be screened.  Ask your health care provider whether you should take a calcium or vitamin D supplement to lower your risk for osteoporosis.  Menopause may have certain physical symptoms and risks.  Hormone replacement therapy may reduce some of these symptoms and risks. Talk to your health care provider about whether hormone replacement therapy is right for you. Follow these instructions at home:  Schedule regular health, dental, and eye exams.  Stay current with your immunizations.  Do not use any tobacco products including cigarettes, chewing tobacco, or electronic cigarettes.  If you are pregnant, do not drink alcohol.  If you are breastfeeding, limit how much and how often you drink alcohol.  Limit alcohol intake to no more than 1 drink per day for nonpregnant women. One drink equals 12 ounces of beer, 5 ounces of wine, or 1 ounces of hard liquor.  Do not use street drugs.  Do not share needles.  Ask your health care provider for help if you need support or information about quitting drugs.  Tell your health care provider if you often feel depressed.  Tell your health care provider if you have ever been abused or do not feel safe at home. This information is not intended to replace advice given to you by your health care provider. Make sure you discuss any questions you have with your health care provider. Document Released: 12/31/2010 Document Revised: 11/23/2015 Document Reviewed: 03/21/2015 Elsevier Interactive Patient Education  2018 [place].  Influenza Virus Vaccine injection What is this medicine? INFLUENZA VIRUS VACCINE (in floo EN zuh VAHY ruhs vak SEEN) helps to reduce the risk of getting influenza also known as the flu.  The vaccine only helps protect you against some strains of the flu. This medicine may be used for  other purposes; ask your health care provider or pharmacist if you have questions. COMMON BRAND NAME(S): Afluria, Agriflu, Alfuria, FLUAD, Fluarix, Fluarix Quadrivalent, Flublok, Flublok Quadrivalent, FLUCELVAX, Flulaval, Fluvirin, Fluzone, Fluzone High-Dose, Fluzone Intradermal What should I tell my health care provider before I take this medicine? They need to know if you have any of these conditions: -bleeding disorder like hemophilia -fever or infection -Guillain-Barre syndrome or other neurological problems -immune system problems -infection with the human immunodeficiency virus (HIV) or AIDS -low blood platelet counts -multiple sclerosis -an unusual or allergic reaction to influenza virus vaccine, latex, other medicines, foods, dyes, or preservatives. Different brands of vaccines contain different allergens. Some may contain latex or eggs. Talk to your doctor about your allergies to make sure that you get the right vaccine. -pregnant or trying to get pregnant -breast-feeding How should I use this medicine? This vaccine is for injection into a muscle or under the skin. It is given by a health care professional. A copy of Vaccine Information Statements will be given before each vaccination. Read this sheet carefully each time. The sheet may change frequently. Talk to your healthcare provider to see which vaccines are right for you. Some vaccines should not be used in all age groups. Overdosage: If you think you have taken too much of this medicine contact a poison control center or emergency room at once. NOTE: This medicine is only for you. Do not share this medicine with others. What if I miss a dose? This does not apply. What may interact with this medicine? -chemotherapy or radiation therapy -medicines that lower your immune system like etanercept, anakinra, infliximab, and adalimumab -medicines that treat or prevent blood clots like warfarin -phenytoin -steroid medicines like  prednisone or cortisone -theophylline -vaccines This list may not describe all possible interactions. Give your health care provider a list of all the medicines, herbs, non-prescription drugs, or dietary supplements you use. Also tell them if you smoke, drink alcohol, or use illegal drugs. Some items may interact with your medicine. What should I watch for while using this medicine? Report any side effects that do not go away within 3 days to your doctor or health care professional. Call your health care provider if any unusual symptoms occur within 6 weeks of receiving this vaccine. You may still catch the flu, but the illness is not usually as bad. You cannot get the flu from the vaccine. The vaccine will not protect against colds or other illnesses that may cause fever. The vaccine is needed every year. What side effects may I notice from receiving this medicine? Side effects that you should report to your doctor or health care professional as soon as possible: -allergic reactions like skin rash, itching or hives, swelling of the face, lips, or tongue Side effects that usually do not require medical attention (report to your doctor or health care professional if they continue or are bothersome): -fever -headache -muscle aches and pains -pain, tenderness, redness, or swelling at the injection site -tiredness This list may not describe all possible side effects. Call your doctor for medical advice about side effects. You may report side effects to FDA at 1-800-FDA-1088. Where should I keep my medicine? The vaccine will be given by a health care professional in a clinic, pharmacy, doctor's office, or other health care setting. You will not be given vaccine doses to store at home. NOTE: This  sheet is a summary. It may not cover all possible information. If you have questions about this medicine, talk to your doctor, pharmacist, or health care provider.  2018 Elsevier/Gold Standard (2015-01-06  10:07:28)

## 2018-03-16 NOTE — Progress Notes (Signed)
Medical screening examination/treatment/procedure(s) were performed by non-physician practitioner and as supervising physician I was immediately available for consultation/collaboration. I agree with above. Edy A Peirce Deveney, MD 

## 2018-03-17 LAB — BASIC METABOLIC PANEL
BUN/Creatinine Ratio: 18 (ref 12–28)
BUN: 16 mg/dL (ref 8–27)
CO2: 26 mmol/L (ref 20–29)
Calcium: 9.4 mg/dL (ref 8.7–10.3)
Chloride: 97 mmol/L (ref 96–106)
Creatinine, Ser: 0.9 mg/dL (ref 0.57–1.00)
GFR calc Af Amer: 68 mL/min/{1.73_m2} (ref >59)
GFR calc non Af Amer: 59 mL/min/{1.73_m2} — ABNORMAL LOW (ref >59)
Glucose: 78 mg/dL (ref 65–99)
Potassium: 4.7 mmol/L (ref 3.5–5.2)
Sodium: 138 mmol/L (ref 134–144)

## 2018-03-17 LAB — CBC
Hematocrit: 36.9 % (ref 34.0–46.6)
Hemoglobin: 12.1 g/dL (ref 11.1–15.9)
MCH: 29.5 pg (ref 26.6–33.0)
MCHC: 32.8 g/dL (ref 31.5–35.7)
MCV: 90 fL (ref 79–97)
Platelets: 229 10*3/uL (ref 150–450)
RBC: 4.1 x10E6/uL (ref 3.77–5.28)
RDW: 12.9 % (ref 12.3–15.4)
WBC: 3.5 10*3/uL (ref 3.4–10.8)

## 2018-03-17 LAB — TSH: TSH: 6.16 u[IU]/mL — ABNORMAL HIGH (ref 0.450–4.500)

## 2018-03-30 ENCOUNTER — Ambulatory Visit (INDEPENDENT_AMBULATORY_CARE_PROVIDER_SITE_OTHER): Payer: Medicare Other | Admitting: Cardiology

## 2018-03-30 ENCOUNTER — Encounter: Payer: Self-pay | Admitting: Cardiology

## 2018-03-30 VITALS — BP 116/70 | HR 66 | Ht 62.0 in | Wt 106.0 lb

## 2018-03-30 DIAGNOSIS — I2510 Atherosclerotic heart disease of native coronary artery without angina pectoris: Secondary | ICD-10-CM

## 2018-03-30 DIAGNOSIS — I10 Essential (primary) hypertension: Secondary | ICD-10-CM | POA: Diagnosis not present

## 2018-03-30 DIAGNOSIS — Z5181 Encounter for therapeutic drug level monitoring: Secondary | ICD-10-CM | POA: Diagnosis not present

## 2018-03-30 DIAGNOSIS — I480 Paroxysmal atrial fibrillation: Secondary | ICD-10-CM

## 2018-03-30 NOTE — Progress Notes (Signed)
03/30/2018 Cynthia Vargas   10-08-1931  [id number]  Primary Physician Hoyt Koch, MD Primary Cardiologist: Dr. Radford Pax   Reason for Visit/CC: F/u for Atrial Fibrillation (newly diagnosed 03/04/18)  HPI: Cynthia Vargas a 84 y.o.femalewith a hx of HTN, nonobstructive CAD,stage IIB carcinoma of the lung,mild to [facility] and moderatepericardial effusionwith acute pericarditis, diagnosed in April 2017 and treated with NSAIDs and colchicine. She was treated for 3 months on colchicine. She had a f/u echo done in May 2017 which showed improvement in effusion with only a trivial amount posterior to the heart. She has been followed by Dr. Radford Pax and last seen by her in May of this year. At that visit, pt complained of episodes of exertional CP that resolved with rest. Subsequently, Dr. Radford Pax ordered for her to get a repeat echo. Echo done June 2019 showed normal LV function w/ EF 60 to 65% with normal diastolic function and mild aortic insufficiency. There is mild to moderate TR. There was also no pericardial effusion noted. It was then recommended that she get a coronary CTA to evaluate for coronary artery disease. CTA 01/14/18 showed coronary calcium score of 127 and mild CAD in the ostial LM and moderate CAD in the proximal LAD. This was sent for [facility] analysis which was negative for any significant stenosis. However given her symptoms, Dr. Radford Pax recommended addition of Imdur. Pt was unable to tolerate and discontinued medication.  Pt was seen back in f/u on 03/04/18 and was noted be in atrial fibrillation w/ RVR. New diagnosis. HR was 126 bpm. BP was soft at 102/64. She denied symptoms at rest but continued to have exertional dyspnea.  Case was discussed with Dr. Burt Knack who was DOD.  Decision was made to stop amlodipine and to change calcium channel blocker to Cardizem, 120 mg daily for rate control.  We opted to stop aspirin to allow for initiation of anticoagulation for stroke  prophylaxis, Eliquis.  Based on dosing criteria using age weight and renal function, patient was prescribed a low dose of 2.5 mg twice daily (age greater than 25 years old and weight less than 60 kg). We also check basic laboratory work that day including TSH which was low (patient advised to follow-up with PCP).  CBC and basic metabolic panel were within normal limits.  Patient was also advised to reduce caffeine consumption and to refrain from NSAIDs while on Eliquis.  She presents to clinic for 3-week follow-up for repeat EKG to see if still in atrial fibrillation. EKG shows that she is back in NSR. HR well controlled at 66 bpm. She reports improvement. She overall feels better. No exertional dyspnea. She denies any recurrent symptoms of afib. She is tolerating new meds ok w/o side effects. BP is stable with Cardizem at 116/70. She reports full compliance w/ Eliquis. No abnormal bleeding and no falls. She has reduced her caffeine consumption significantly.   Current Meds  Medication Sig  . apixaban (ELIQUIS) 2.5 MG TABS tablet Take 1 tablet (2.5 mg total) by mouth 2 (two) times daily.  [name] atorvastatin (LIPITOR) 20 MG tablet Take 1 tablet (20 mg total) by mouth daily.  [name] diltiazem (CARDIZEM CD) 120 MG 24 hr capsule Take 1 capsule (120 mg total) by mouth daily.  . Multiple Vitamins-Minerals (MULTIVITAMIN PO) Take 1 tablet by mouth.  . nitroGLYCERIN (NITROSTAT) 0.4 MG SL tablet Place 0.4 mg under the tongue every 5 (five) minutes as needed for chest pain.   Allergies  Allergen Reactions  .  Peanut-Containing Drug Products Other (See Comments)    angioedema   Past Medical History:  Diagnosis Date  . Acute pericarditis 11/01/2015   a. moderate pericardial effusion by echo 09/2015 - signficantly improved with trivial effusion on repeat echo after NSAIDS and colchicine 10/2015.  [name] Aortic insufficiency   . CAD (coronary artery disease), native coronary artery    0-25% ostial LM and 40-69% pLAD with  normal FFR by coronary CTA 12/2017  . CALCANEAL FRACTURE, RIGHT 11/04/2007  . Essential hypertension   . GERD (gastroesophageal reflux disease)   . Hyperlipidemia LDL goal <70 02/08/2016  . KNEE PAIN, RIGHT, CHRONIC 03/19/2010  . Lung nodule    a. found on CT 2017 - possible stage IIB carcinoma.  . Mitral regurgitation   . Nonobstructive atherosclerosis of coronary artery   . OSTEOPOROSIS 05/02/2007  . PEPTIC ULCER DISEASE 05/02/2007   Family History  Problem Relation Age of Onset  . Cancer Father   . Heart disease Mother   . Diabetes Neg Hx   . Hyperlipidemia Neg Hx   . Hypertension Neg Hx    Past Surgical History:  Procedure Laterality Date  . Calcaneal fracture, right    . CHOLECYSTECTOMY    . DILATION AND CURETTAGE OF UTERUS    . ESOPHAGOGASTRODUODENOSCOPY N/A 03/30/2013   Procedure: ESOPHAGOGASTRODUODENOSCOPY (EGD);  Surgeon: Jerene Bears, MD;  Location: [facility];  Service: Endoscopy;  Laterality: N/A;  . FUDUCIAL PLACEMENT N/A 09/29/2015   Procedure:  PLACEMENT OF FUDUCIAL Marker times three;  Surgeon: Grace Isaac, MD;  Location: [facility];  Service: Thoracic;  Laterality: N/A;  . LUNG BIOPSY N/A 09/29/2015   Procedure: LUNG BIOPSY;  Surgeon: Grace Isaac, MD;  Location: [facility];  Service: Thoracic;  Laterality: N/A;  . ORIF HIP FRACTURE    . TONSILLECTOMY    . VIDEO BRONCHOSCOPY WITH ENDOBRONCHIAL NAVIGATION N/A 09/29/2015   Procedure: VIDEO BRONCHOSCOPY WITH ENDOBRONCHIAL NAVIGATION;  Surgeon: Grace Isaac, MD;  Location: [facility];  Service: Thoracic;  Laterality: N/A;   Social History   Socioeconomic History  . Marital status: Single    Spouse name: Not on file  . Number of children: 1  . Years of education: 19  . Highest education level: Not on file  Occupational History  . Occupation: reitred  Social Needs  . Financial resource strain: Not hard at all  . Food insecurity:    Worry: Never true    Inability: Never true  . Transportation needs:    Medical:  No    Non-medical: No  Tobacco Use  . Smoking status: Never Smoker  . Smokeless tobacco: Never Used  Substance and Sexual Activity  . Alcohol use: Yes    Alcohol/week: 1.0 standard drinks    Types: 1 Glasses of wine per week  . Drug use: No  . Sexual activity: Never  Lifestyle  . Physical activity:    Days per week: 4 days    Minutes per session: 40 min  . Stress: Not at all  Relationships  . Social connections:    Talks on phone: More than three times a week    Gets together: More than three times a week    Attends religious service: 1 to 4 times per year    Active member of club or organization: Yes    Attends meetings of clubs or organizations: More than 4 times per year    Relationship status: Not on file  . Intimate partner violence:  Fear of current or ex partner: Not on file    Emotionally abused: Not on file    Physically abused: Not on file    Forced sexual activity: Not on file  Other Topics Concern  . Not on file  Social History Narrative   [employer] degrees in Development worker, community. married for 2 years then single . 1 son - Harvie Junior [place]. 2 daughters-put up for adoption. Lives alone and is independent in ADL's. works at [employer] until injured.     Review of Systems: General: negative for chills, fever, night sweats or weight changes.  Cardiovascular: negative for chest pain, dyspnea on exertion, edema, orthopnea, palpitations, paroxysmal nocturnal dyspnea or shortness of breath Dermatological: negative for rash Respiratory: negative for cough or wheezing Urologic: negative for hematuria Abdominal: negative for nausea, vomiting, diarrhea, bright red blood per rectum, melena, or hematemesis Neurologic: negative for visual changes, syncope, or dizziness All other systems reviewed and are otherwise negative except as noted above.   Physical Exam:  Blood pressure 116/70, pulse 66, height 5\' 2"  (1.575 m), weight 106 lb (48.1 kg).  General appearance: alert,  cooperative, no distress and elderly, frail WF Neck: no carotid bruit and no JVD Lungs: clear to auscultation bilaterally Heart: regular rate and rhythm, S1, S2 normal, no murmur, click, rub or gallop Extremities: extremities normal, atraumatic, no cyanosis or edema Pulses: 2+ and symmetric Skin: Skin color, texture, turgor normal. No rashes or lesions Neurologic: Grossly normal  EKG NSR 66 bpm -- personally reviewed   ASSESSMENT AND PLAN:   1. PAF: back in NSR w/ HR in the 60s. Symptoms resolved. No further exertional dyspnea. Tolerating new meds ok w/o side effects. BP stable w/ Cardizem. No abnormal bleeding w/ Eliquis. We will continue Cardizem 120 mg daily for rate control and Eliquis 2.5 mg BID for stroke prophylaxis. Of note, recent echo showed normal LVEF and recent coronary CTA showed no significant CAD. Labs showed normal electrolytes and no hyperthyroidism. Pt advised to continue to limit caffeine intake. We will check repeat CBC today to ensure H/H is stable on Eliquis.    Follow-Up w/ Dr. Radford Pax in 3 months.   Cynthia Vargas, MHS [facility] 03/30/2018 12:02 PM

## 2018-03-30 NOTE — Patient Instructions (Signed)
Medication Instructions:  Your physician recommends that you continue on your current medications as directed. Please refer to the Current Medication list given to you today.   Labwork: TODAY:  CBC  Testing/Procedures: None ordered  Follow-Up: Your physician recommends that you schedule a follow-up appointment in: 2-3 MONTHS WITH DR. Radford Pax    Any Other Special Instructions Will Be Listed Below (If Applicable).     If you need a refill on your cardiac medications before your next appointment, please call your pharmacy.

## 2018-03-31 LAB — CBC
Hematocrit: 37.5 % (ref 34.0–46.6)
Hemoglobin: 11.9 g/dL (ref 11.1–15.9)
MCH: 30.1 pg (ref 26.6–33.0)
MCHC: 31.7 g/dL (ref 31.5–35.7)
MCV: 95 fL (ref 79–97)
Platelets: 213 10*3/uL (ref 150–450)
RBC: 3.95 x10E6/uL (ref 3.77–5.28)
RDW: 13.1 % (ref 12.3–15.4)
WBC: 3.5 10*3/uL (ref 3.4–10.8)

## 2018-03-31 NOTE — Progress Notes (Signed)
Pt has been made aware of normal result and verbalized understanding.  jw 03/31/18

## 2018-04-06 ENCOUNTER — Encounter: Payer: Self-pay | Admitting: Internal Medicine

## 2018-04-06 ENCOUNTER — Ambulatory Visit (INDEPENDENT_AMBULATORY_CARE_PROVIDER_SITE_OTHER)
Admission: RE | Admit: 2018-04-06 | Discharge: 2018-04-06 | Disposition: A | Discharge status: Home / Self Care | Payer: Medicare Other | Source: Ambulatory Visit | Attending: Internal Medicine | Admitting: Internal Medicine

## 2018-04-06 ENCOUNTER — Ambulatory Visit (INDEPENDENT_AMBULATORY_CARE_PROVIDER_SITE_OTHER): Payer: Medicare Other | Admitting: Internal Medicine

## 2018-04-06 VITALS — BP 102/62 | HR 78 | Temp 98.3°F | Ht 62.0 in | Wt 108.0 lb

## 2018-04-06 DIAGNOSIS — N393 Stress incontinence (female) (male): Secondary | ICD-10-CM | POA: Diagnosis not present

## 2018-04-06 DIAGNOSIS — E2839 Other primary ovarian failure: Secondary | ICD-10-CM

## 2018-04-06 DIAGNOSIS — I10 Essential (primary) hypertension: Secondary | ICD-10-CM

## 2018-04-06 DIAGNOSIS — I2510 Atherosclerotic heart disease of native coronary artery without angina pectoris: Secondary | ICD-10-CM

## 2018-04-06 DIAGNOSIS — R7989 Other specified abnormal findings of blood chemistry: Secondary | ICD-10-CM

## 2018-04-06 DIAGNOSIS — Z23 Encounter for immunization: Secondary | ICD-10-CM | POA: Diagnosis not present

## 2018-04-06 MED ORDER — MIRABEGRON ER 50 MG PO TB24: 50.0000 mg | ORAL_TABLET | Freq: Every day | ORAL | 1 refills | Status: DC

## 2018-04-06 NOTE — Patient Instructions (Signed)
We have sent in the new medicine for the bladder called myrbetriq. Take 1 pill daily and let us know in 1 month if it is not helping.

## 2018-04-06 NOTE — Progress Notes (Signed)
   Subjective:    Patient ID: Cynthia Vargas, female    DOB: 1931/09/16, 84 y.o.   MRN: [id number]  HPI The patient is an 84 YO female coming in for urinary incontinence (worsening and she wants to know if there is any treatment, she is having some leaking and some large volume leaking, denies trying any medicines for this in the past, denies burning or symptoms of infection) follow up of thyroid (has had elevated TSH at the heart specialist and wants to know if she needs to change therapy, has had some elevated TSH in the last several years and never required treatment, denies fatigue or cold or heat intolerance, denies change in bowels) and follow up blood pressure (taking diltiazem, BP at goal, denies headaches or chest pains, denies SOB).   Review of Systems  Constitutional: Negative.   HENT: Negative.   Eyes: Negative.   Respiratory: Negative for cough, chest tightness and shortness of breath.   Cardiovascular: Negative for chest pain, palpitations and leg swelling.  Gastrointestinal: Negative for abdominal distention, abdominal pain, constipation, diarrhea, nausea and vomiting.  Musculoskeletal: Negative.   Skin: Negative.   Neurological: Negative.   Psychiatric/Behavioral: Negative.       Objective:   Physical Exam  Constitutional: She is oriented to person, place, and time. She appears well-developed and well-nourished.  HENT:  Head: Normocephalic and atraumatic.  Eyes: EOM are normal.  Neck: Normal range of motion.  Cardiovascular: Normal rate and regular rhythm.  Pulmonary/Chest: Effort normal and breath sounds normal. No respiratory distress. She has no wheezes. She has no rales.  Abdominal: Soft. Bowel sounds are normal. She exhibits no distension. There is no tenderness. There is no rebound.  Musculoskeletal: She exhibits no edema.  Neurological: She is alert and oriented to person, place, and time. Coordination normal.  Skin: Skin is warm and dry.  Psychiatric: She  has a normal mood and affect.   Vitals:   04/06/18 1426  BP: 102/62  Pulse: 78  Temp: 98.3 F (36.8 C)  TempSrc: Oral  SpO2: 96%  Weight: 108 lb (49 kg)  Height: 5\' 2"  (1.575 m)      Assessment & Plan:  prevnar 13 given at visit

## 2018-04-07 ENCOUNTER — Telehealth: Payer: Self-pay | Admitting: Internal Medicine

## 2018-04-07 NOTE — Telephone Encounter (Signed)
Copied from [place]. Topic: Quick Communication - Rx Refill/Question >> Apr 07, 2018  3:06 PM Waylan Rocher, Lumin L wrote: Medication: mirabegron ER (MYRBETRIQ) 50 MG TB24 tablet (costs $500, needs PA or alternate lesser expensive script) please call patient so she knows what course will be taken  Has the patient contacted their pharmacy? Yes.   (Agent: If no, request that the patient contact the pharmacy for the refill.) (Agent: If yes, when and what did the pharmacy advise?)  Preferred Pharmacy (with phone number or street name): [facility], [address] AT [address] [place] [address] Phone: [phone] Fax: [phone]    Agent: Please be advised that RX refills may take up to 3 business days. We ask that you follow-up with your pharmacy.

## 2018-04-07 NOTE — Telephone Encounter (Signed)
Will start PA tomorrow

## 2018-04-08 NOTE — Telephone Encounter (Signed)
Did PA for medication but it is not needed, but insurance  prefers Tolterodine Oxybutynin

## 2018-04-09 DIAGNOSIS — R32 Unspecified urinary incontinence: Secondary | ICD-10-CM | POA: Insufficient documentation

## 2018-04-09 NOTE — Assessment & Plan Note (Signed)
At this time still does not appear she has thyroid disease. Will continue with monitoring. Without symptoms TSH goal for treatment is <10.

## 2018-04-09 NOTE — Telephone Encounter (Signed)
FYI Talked to patient and is not in the doughnut hole, but patient stated that she had changed her mind and does not want to take any medication and will call back if she changes her mind

## 2018-04-09 NOTE — Assessment & Plan Note (Signed)
Rx for myrbetriq which is the safest option.

## 2018-04-09 NOTE — Assessment & Plan Note (Signed)
BP at goal with diltiazem, recent monitoring of BMP so no labs today.

## 2018-04-09 NOTE — Telephone Encounter (Signed)
Is she in doughnut hole or is the usual price for med?

## 2018-04-10 ENCOUNTER — Other Ambulatory Visit: Payer: Self-pay | Admitting: Internal Medicine

## 2018-04-10 MED ORDER — ALENDRONATE SODIUM 70 MG PO TABS: 70.0000 mg | ORAL_TABLET | ORAL | 3 refills | Status: DC

## 2018-05-18 ENCOUNTER — Telehealth: Payer: Self-pay

## 2018-05-18 NOTE — Telephone Encounter (Signed)
Called patient and asked if she had any recent labs besides the ones from September. Patient stated no that those are the labs she is talking about. Informed patient that as long as labs are less than 10 she will not need medication so we will keep monitoring thyroid at her visits with [place]. Patient stated understanding. Patient also has not started the myrbetriq as it was going to cost 500 dollars, I informed patient that I will do a PA for her this week and see if I can get that covered or at least at a lower price.

## 2018-05-18 NOTE — Telephone Encounter (Signed)
Do you want patient to make an appointment?

## 2018-05-18 NOTE — Telephone Encounter (Signed)
Is she talking about labs from September? We had a visit in October to discuss management of these labs. If they are newer than that I have not seen anything newer.

## 2018-05-18 NOTE — Telephone Encounter (Signed)
Copied from [place] [id number]. Topic: General - Other >> May 18, 2018 11:11 AM Carolyn Stare wrote:  Abbott Pao said Dr Radford Pax was supposeto send over a report about thyroid and is asking if this has been received because she need to start taking medicine

## 2018-05-20 NOTE — Telephone Encounter (Signed)
Myrbetriq is 500 dollars and insurance prefers oxybutynin

## 2018-05-21 MED ORDER — OXYBUTYNIN CHLORIDE ER 5 MG PO TB24: 5.0000 mg | ORAL_TABLET | Freq: Every day | ORAL | 0 refills | Status: DC

## 2018-05-21 NOTE — Telephone Encounter (Signed)
Sent in, however this medication comes with risk of side effects of dizziness and constipation.

## 2018-05-21 NOTE — Telephone Encounter (Signed)
Patient informed of MD response states will see how much the medication costs if it is decently priced will try, and will watch out for those side effects

## 2018-05-21 NOTE — Addendum Note (Signed)
Addended by: Pricilla Holm A on: 05/21/2018 07:38 AM   Modules accepted: Orders

## 2018-06-02 ENCOUNTER — Encounter: Payer: Self-pay | Admitting: Physician Assistant

## 2018-06-02 ENCOUNTER — Ambulatory Visit (INDEPENDENT_AMBULATORY_CARE_PROVIDER_SITE_OTHER): Payer: Medicare Other | Admitting: Physician Assistant

## 2018-06-02 VITALS — BP 120/70 | HR 63 | Ht 62.0 in | Wt 110.4 lb

## 2018-06-02 DIAGNOSIS — R7989 Other specified abnormal findings of blood chemistry: Secondary | ICD-10-CM | POA: Diagnosis not present

## 2018-06-02 DIAGNOSIS — I2510 Atherosclerotic heart disease of native coronary artery without angina pectoris: Secondary | ICD-10-CM | POA: Diagnosis not present

## 2018-06-02 DIAGNOSIS — I351 Nonrheumatic aortic (valve) insufficiency: Secondary | ICD-10-CM

## 2018-06-02 DIAGNOSIS — E785 Hyperlipidemia, unspecified: Secondary | ICD-10-CM | POA: Diagnosis not present

## 2018-06-02 DIAGNOSIS — I4891 Unspecified atrial fibrillation: Secondary | ICD-10-CM

## 2018-06-02 NOTE — Progress Notes (Signed)
Cardiology Office Note    Date:  06/02/2018   ID:  Cynthia Vargas, Cynthia Vargas 13-Sep-1931, MRN [id number]  PCP:  Hoyt Koch, MD  Cardiologist: Fransico Him, MD EPS: None  Chief Complaint  Patient presents with  . Follow-up    History of Present Illness:  Cynthia Vargas is a 84 y.o. female history of nonobstructive CAD, hypertension, mild to moderate aortic insufficiency, moderate pericardial effusion with acute pericarditis 09/2015 with follow-up echo showed improvement in effusion.  Chest pain 11/1998 nineteen 2D echo showed normal LVEF 60 to 65% with normal diastolic function and mild aortic insufficiency, mild to moderate TR and no pericardial effusion.  Coronary CTA showed calcium score of 127 with mild CAD in the ostial left main and moderate CAD in the proximal LAD.  FFR was negative for any significant stenosis.  Dr. Radford Pax added Imdur but patient was unable to tolerate this.  Patient had new onset atrial fibrillation with RVR 03/04/2018.  Amlodipine was stopped and she was started on Cardizem for rate control as well as Eliquis 2.5 mg twice daily because of age and weight.  Follow-up office visit with Ellen Henri, PA-C 03/30/2018 patient was back in normal sinus rhythm.  Patient comes in today for routine follow-up.  She complains of significant fatigue.  She is a little worn out from the holidays but is also concerned about her thyroid with a TSH over 6.  She was told by her PCP that this would not be treated until it reaches 20.   Past Medical History:  Diagnosis Date  . Acute pericarditis 11/01/2015   a. moderate pericardial effusion by echo 09/2015 - signficantly improved with trivial effusion on repeat echo after NSAIDS and colchicine 10/2015.  [name] Aortic insufficiency   . CAD (coronary artery disease), native coronary artery    0-25% ostial LM and 40-69% pLAD with normal FFR by coronary CTA 12/2017  . CALCANEAL FRACTURE, RIGHT 11/04/2007  . Essential hypertension     . GERD (gastroesophageal reflux disease)   . Hyperlipidemia LDL goal <70 02/08/2016  . KNEE PAIN, RIGHT, CHRONIC 03/19/2010  . Lung nodule    a. found on CT 2017 - possible stage IIB carcinoma.  . Mitral regurgitation   . Nonobstructive atherosclerosis of coronary artery   . OSTEOPOROSIS 05/02/2007  . PEPTIC ULCER DISEASE 05/02/2007    Past Surgical History:  Procedure Laterality Date  . Calcaneal fracture, right    . CHOLECYSTECTOMY    . DILATION AND CURETTAGE OF UTERUS    . ESOPHAGOGASTRODUODENOSCOPY N/A 03/30/2013   Procedure: ESOPHAGOGASTRODUODENOSCOPY (EGD);  Surgeon: Jerene Bears, MD;  Location: [facility];  Service: Endoscopy;  Laterality: N/A;  . FUDUCIAL PLACEMENT N/A 09/29/2015   Procedure:  PLACEMENT OF FUDUCIAL Marker times three;  Surgeon: Grace Isaac, MD;  Location: [facility];  Service: Thoracic;  Laterality: N/A;  . LUNG BIOPSY N/A 09/29/2015   Procedure: LUNG BIOPSY;  Surgeon: Grace Isaac, MD;  Location: [facility];  Service: Thoracic;  Laterality: N/A;  . ORIF HIP FRACTURE    . TONSILLECTOMY    . VIDEO BRONCHOSCOPY WITH ENDOBRONCHIAL NAVIGATION N/A 09/29/2015   Procedure: VIDEO BRONCHOSCOPY WITH ENDOBRONCHIAL NAVIGATION;  Surgeon: Grace Isaac, MD;  Location: [facility];  Service: Thoracic;  Laterality: N/A;    Current Medications: Current Meds  Medication Sig  . alendronate (FOSAMAX) 70 MG tablet Take 1 tablet (70 mg total) by mouth every 7 (seven) days. Take with a full glass of water on  an empty stomach.  [name] apixaban (ELIQUIS) 2.5 MG TABS tablet Take 1 tablet (2.5 mg total) by mouth 2 (two) times daily.  [name] atorvastatin (LIPITOR) 20 MG tablet Take 1 tablet (20 mg total) by mouth daily.  [name] diltiazem (CARDIZEM CD) 120 MG 24 hr capsule Take 1 capsule (120 mg total) by mouth daily.  . Multiple Vitamins-Minerals (MULTIVITAMIN PO) Take 1 tablet by mouth.  . nitroGLYCERIN (NITROSTAT) 0.4 MG SL tablet Place 0.4 mg under the tongue every 5 (five) minutes as needed for  chest pain.  [name] oxybutynin (DITROPAN-XL) 5 MG 24 hr tablet Take 1 tablet (5 mg total) by mouth at bedtime.     Allergies:   Peanut-containing drug products   Social History   Socioeconomic History  . Marital status: Single    Spouse name: Not on file  . Number of children: 1  . Years of education: 13  . Highest education level: Not on file  Occupational History  . Occupation: reitred  Social Needs  . Financial resource strain: Not hard at all  . Food insecurity:    Worry: Never true    Inability: Never true  . Transportation needs:    Medical: No    Non-medical: No  Tobacco Use  . Smoking status: Never Smoker  . Smokeless tobacco: Never Used  Substance and Sexual Activity  . Alcohol use: Yes    Alcohol/week: 1.0 standard drinks    Types: 1 Glasses of wine per week  . Drug use: No  . Sexual activity: Never  Lifestyle  . Physical activity:    Days per week: 4 days    Minutes per session: 40 min  . Stress: Not at all  Relationships  . Social connections:    Talks on phone: More than three times a week    Gets together: More than three times a week    Attends religious service: 1 to 4 times per year    Active member of club or organization: Yes    Attends meetings of clubs or organizations: More than 4 times per year    Relationship status: Not on file  Other Topics Concern  . Not on file  Social History Narrative   [employer] degrees in Development worker, community. married for 2 years then single . 1 son - Harvie Junior [place]. 2 daughters-put up for adoption. Lives alone and is independent in ADL's. works at [employer] until injured.     Family History:  The patient's family history includes Cancer in her father; Heart disease in her mother.   ROS:   Please see the history of present illness.    Review of Systems  Constitution: Positive for malaise/fatigue.  HENT: Negative.   Eyes: Negative.   Cardiovascular: Negative.   Respiratory: Negative.   Hematologic/Lymphatic: Negative.    Musculoskeletal: Positive for arthritis and muscle weakness. Negative for joint pain.  Gastrointestinal: Negative.   Genitourinary: Negative.   Neurological: Positive for weakness.   All other systems reviewed and are negative.   PHYSICAL EXAM:   VS:  BP 120/70   Pulse 63   Ht 5' 2"  (1.575 m)   Wt 110 lb 6.4 oz (50.1 kg)   SpO2 95%   BMI 20.19 kg/m   Physical Exam  GEN: Well nourished, well developed, in no acute distress  Neck: no JVD, carotid bruits, or masses Cardiac:RRR; no murmurs, rubs, or gallops  Respiratory:  clear to auscultation bilaterally, normal work of breathing GI: soft, nontender, nondistended, + BS Ext:  without cyanosis, clubbing, or edema, Good distal pulses bilaterally Neuro:  Alert and Oriented x 3 Psych: euthymic mood, full affect  Wt Readings from Last 3 Encounters:  06/02/18 110 lb 6.4 oz (50.1 kg)  04/06/18 108 lb (49 kg)  03/30/18 106 lb (48.1 kg)      Studies/Labs Reviewed:   EKG:  EKG is not ordered today.   Recent Labs: 12/01/2017: ALT 19 03/16/2018: BUN 16; Creatinine, Ser 0.90; Potassium 4.7; Sodium 138; TSH 6.160 03/30/2018: Hemoglobin 11.9; Platelets 213   Lipid Panel    Component Value Date/Time   CHOL 161 12/01/2017 1025   TRIG 33 12/01/2017 1025   HDL 95 12/01/2017 1025   CHOLHDL 1.7 12/01/2017 1025   CHOLHDL 1.5 11/22/2015 1131   VLDL 7 11/22/2015 1131   LDLCALC 59 12/01/2017 1025   LDLDIRECT 115.1 03/19/2010 1406    Additional studies/ records that were reviewed today include:  Coronary CTA 01/14/2018 IMPRESSION: 1. Coronary calcium score of 127. This was 48 percentile for age and sex matched control.   2. Normal coronary origin with right dominance.   3. Mild CAD in the ostial left main and moderate CAD in the proximal LAD. Additional analysis with CT FFR will be submitted.     Electronically Signed   By: Ena Dawley   On: 01/14/2018 19:20  FFR analysis 7/18/2019FF/RCT ANALYSIS   FINDINGS: FFRct analysis  was performed on the original cardiac CT angiogram dataset. Diagrammatic representation of the FFRct analysis is provided in a separate PDF document in PACS. This dictation was created using the PDF document and an interactive 3D model of the results. 3D model is not available in the EMR/PACS. Normal FFR range is >0.80.   1. Left Main:  No significant stenosis.   2. LAD: No significant stenosis. 3. LCX: No significant stenosis. 4. RCA: No significant stenosis.   IMPRESSION: 1.  CT FFR analysis didn't show any significant stenosis.     Electronically Signed   By: Ena Dawley   On: 01/15/2018 13:59     2D echo 6/2019Study Conclusions   - Left ventricle: The cavity size was normal. Wall thickness was   normal. Systolic function was normal. The estimated ejection   fraction was in the range of 60% to 65%. Wall motion was normal;   there were no regional wall motion abnormalities. Left   ventricular diastolic function parameters were normal. - Aortic valve: There was mild regurgitation. - Atrial septum: No defect or patent foramen ovale was identified. - Tricuspid valve: There was mild-moderate regurgitation. - Pulmonary arteries: PA peak pressure: 39 mm Hg (S). - Impressions: Normal GLS -21.9.   Impressions:   - Normal GLS -21.9.  Nuclear stress test 6/3/2019Study Highlights      Nuclear stress EF: 71%.  There was no ST segment deviation noted during stress.  The study is normal.  The left ventricular ejection fraction is hyperdynamic (>65%).   1. EF 71%, normal wall motion.  2. No evidence for ischemia or infarction by perfusion images.    Normal study.        ASSESSMENT:    1. Atrial fibrillation with RVR ([place])   2. Coronary artery disease involving native coronary artery of native heart without angina pectoris   3. Aortic valve insufficiency, etiology of cardiac valve disease unspecified   4. Hyperlipidemia LDL goal <70   5. Abnormal TSH       PLAN:  In order of problems listed above:  Atrial fibrillation with  RVR now in normal sinus rhythm on low-dose diltiazem and Eliquis low-dose because of age and weight.  Patient tolerating well.  Will update labs including CBC and be met today.  CAD coronary CT mild left main and moderate LAD normal FFR  Aortic valve insufficiency mild on echo 11/2017  Elevated TSH at 6 with excessive fatigue.  Of asked her to discuss this further with PCP and possibly have other thyroid functions checked or speak with an endocrinologist.  Medication Adjustments/Labs and Tests Ordered: Current medicines are reviewed at length with the patient today.  Concerns regarding medicines are outlined above.  Medication changes, Labs and Tests ordered today are listed in the Patient Instructions below. Patient Instructions  Medication Instructions:  Your physician recommends that you continue on your current medications as directed. Please refer to the Current Medication list given to you today.  If you need a refill on your cardiac medications before your next appointment, please call your pharmacy.   Lab work: TODAY: CBC, BMET  If you have labs (blood work) drawn today and your tests are completely normal, you will receive your results only by: [name] MyChart Message (if you have MyChart) OR . A paper copy in the mail If you have any lab test that is abnormal or we need to change your treatment, we will call you to review the results.  Testing/Procedures: None ordered  Follow-Up: At [facility], you and your health needs are our priority.  As part of our continuing mission to provide you with exceptional heart care, we have created designated Provider Care Teams.  These Care Teams include your primary Cardiologist (physician) and Advanced Practice Providers (APPs -  Physician Assistants and Nurse Practitioners) who all work together to provide you with the care you need, when you need it. . You will need  a follow up appointment in 6 months.  Please call our office 2 months in advance to schedule this appointment.  You may see Fransico Him, MD or one of the following Advanced Practice Providers on your designated Care Team:   . Lyda Jester, PA-C . Dayna Dunn, PA-C . Ermalinda Barrios, PA-C  Any Other Special Instructions Will Be Listed Below (If Applicable).       Signed, Ermalinda Barrios, PA-C  06/02/2018 1:44 PM    [facility] [address], [place], [place]  [address] Phone: [phone]; Fax: [phone]

## 2018-06-02 NOTE — Patient Instructions (Addendum)
Medication Instructions:  Your physician recommends that you continue on your current medications as directed. Please refer to the Current Medication list given to you today.  If you need a refill on your cardiac medications before your next appointment, please call your pharmacy.   Lab work: TODAY: CBC, BMET  If you have labs (blood work) drawn today and your tests are completely normal, you will receive your results only by: [name] MyChart Message (if you have MyChart) OR . A paper copy in the mail If you have any lab test that is abnormal or we need to change your treatment, we will call you to review the results.  Testing/Procedures: None ordered  Follow-Up: At [facility], you and your health needs are our priority.  As part of our continuing mission to provide you with exceptional heart care, we have created designated Provider Care Teams.  These Care Teams include your primary Cardiologist (physician) and Advanced Practice Providers (APPs -  Physician Assistants and Nurse Practitioners) who all work together to provide you with the care you need, when you need it. . You will need a follow up appointment in 6 months.  Please call our office 2 months in advance to schedule this appointment.  You may see Fransico Him, MD or one of the following Advanced Practice Providers on your designated Care Team:   . Lyda Jester, PA-C . Dayna Dunn, PA-C . Ermalinda Barrios, PA-C  Any Other Special Instructions Will Be Listed Below (If Applicable).

## 2018-06-03 LAB — CBC
Hematocrit: 34.7 % (ref 34.0–46.6)
Hemoglobin: 12 g/dL (ref 11.1–15.9)
MCH: 30.9 pg (ref 26.6–33.0)
MCHC: 34.6 g/dL (ref 31.5–35.7)
MCV: 89 fL (ref 79–97)
Platelets: 231 10*3/uL (ref 150–450)
RBC: 3.88 x10E6/uL (ref 3.77–5.28)
RDW: 12.4 % (ref 12.3–15.4)
WBC: 3.6 10*3/uL (ref 3.4–10.8)

## 2018-06-03 LAB — BASIC METABOLIC PANEL
BUN/Creatinine Ratio: 14 (ref 12–28)
BUN: 13 mg/dL (ref 8–27)
CO2: 25 mmol/L (ref 20–29)
Calcium: 9.1 mg/dL (ref 8.7–10.3)
Chloride: 97 mmol/L (ref 96–106)
Creatinine, Ser: 0.92 mg/dL (ref 0.57–1.00)
GFR calc Af Amer: 66 mL/min/{1.73_m2} (ref >59)
GFR calc non Af Amer: 57 mL/min/{1.73_m2} — ABNORMAL LOW (ref >59)
Glucose: 81 mg/dL (ref 65–99)
Potassium: 4.6 mmol/L (ref 3.5–5.2)
Sodium: 136 mmol/L (ref 134–144)

## 2018-06-04 ENCOUNTER — Telehealth: Payer: Self-pay

## 2018-06-04 NOTE — Telephone Encounter (Signed)
Patient wants a referral to endocrinology

## 2018-06-04 NOTE — Telephone Encounter (Signed)
Noted  

## 2018-06-04 NOTE — Telephone Encounter (Signed)
Is there a question here? I'm confused

## 2018-06-04 NOTE — Telephone Encounter (Signed)
Copied from [place] [id number]. Topic: Referral - Request for Referral >> Jun 04, 2018 11:59 AM Ivar Drape wrote: Has patient seen PCP for this complaint?   YES *If NO, is insurance requiring patient see PCP for this issue before PCP can refer them? Referral for which specialty:   Endocrinology Preferred provider/office:   Dr. Renato Shin Reason for referral:   Thyroid Problems

## 2018-06-04 NOTE — Telephone Encounter (Signed)
Appt made for tomorrow , in September patient had labs and had elevated TSH, Level was 6.16.

## 2018-06-04 NOTE — Telephone Encounter (Signed)
Patient does not have thyroid problems, would she like a visit with [place] to discuss?

## 2018-06-04 NOTE — Telephone Encounter (Signed)
Patient would need to discuss with Dr. Sharlet Salina, as far as we know she does not have thyroid problems.

## 2018-06-05 ENCOUNTER — Ambulatory Visit (INDEPENDENT_AMBULATORY_CARE_PROVIDER_SITE_OTHER): Payer: Medicare Other | Admitting: Internal Medicine

## 2018-06-05 ENCOUNTER — Other Ambulatory Visit (INDEPENDENT_AMBULATORY_CARE_PROVIDER_SITE_OTHER): Payer: Medicare Other

## 2018-06-05 ENCOUNTER — Encounter: Payer: Self-pay | Admitting: Internal Medicine

## 2018-06-05 VITALS — BP 122/82 | HR 66 | Temp 98.3°F | Ht 62.0 in | Wt 108.0 lb

## 2018-06-05 DIAGNOSIS — R7989 Other specified abnormal findings of blood chemistry: Secondary | ICD-10-CM | POA: Diagnosis not present

## 2018-06-05 DIAGNOSIS — N393 Stress incontinence (female) (male): Secondary | ICD-10-CM

## 2018-06-05 DIAGNOSIS — I2510 Atherosclerotic heart disease of native coronary artery without angina pectoris: Secondary | ICD-10-CM

## 2018-06-05 DIAGNOSIS — R5383 Other fatigue: Secondary | ICD-10-CM

## 2018-06-05 LAB — TSH: TSH: 4.45 u[IU]/mL (ref 0.35–4.50)

## 2018-06-05 LAB — T4, FREE: Free T4: 0.68 ng/dL (ref 0.60–1.60)

## 2018-06-05 NOTE — Patient Instructions (Signed)
We will check the labs today and call you back about the results.   It is okay to take 2 pills a day of the bladder medicine to see if this helps more. If so call and let us know and we will change the dose so when you refill it it will be 10 mg daily (instead of 5 mg now) so you will just take 1 pill.

## 2018-06-05 NOTE — Progress Notes (Signed)
   Subjective:    Patient ID: Cynthia Vargas, female    DOB: 07-21-1931, 84 y.o.   MRN: [id number]  HPI The patient is an 84 YO female coming in for concerns about thyroid. She had called in wanting to see an endocrine doctor. She was told by her cardiologist that her thyroid levels were bad and she had a problem. She came in and talked to [place] about this in October but does not recall this. She denies fatigue, constipation, diarrhea, palpitations. She denies weight change.   Review of Systems  Constitutional: Negative.   HENT: Negative.   Eyes: Negative.   Respiratory: Negative for cough, chest tightness and shortness of breath.   Cardiovascular: Negative for chest pain, palpitations and leg swelling.  Gastrointestinal: Negative for abdominal distention, abdominal pain, constipation, diarrhea, nausea and vomiting.  Musculoskeletal: Negative.   Skin: Negative.   Neurological: Negative.   Psychiatric/Behavioral: Negative.       Objective:   Physical Exam  Constitutional: She is oriented to person, place, and time. She appears well-developed and well-nourished.  HENT:  Head: Normocephalic and atraumatic.  Eyes: EOM are normal.  Neck: Normal range of motion.  Cardiovascular: Normal rate and regular rhythm.  Pulmonary/Chest: Effort normal and breath sounds normal. No respiratory distress. She has no wheezes. She has no rales.  Abdominal: Soft. Bowel sounds are normal. She exhibits no distension. There is no tenderness. There is no rebound.  Musculoskeletal: She exhibits no edema.  Neurological: She is alert and oriented to person, place, and time. Coordination normal.  Skin: Skin is warm and dry.  Psychiatric: She has a normal mood and affect.   Vitals:   06/05/18 1449  BP: 122/82  Pulse: 66  Temp: 98.3 F (36.8 C)  TempSrc: Oral  SpO2: 97%  Weight: 108 lb (49 kg)  Height: 5\' 2"  (1.575 m)      Assessment & Plan:

## 2018-06-05 NOTE — Assessment & Plan Note (Signed)
We reminded her again that her TSH has been mildly above the reference range for many years and that guidelines for thyroid have changed and <10 is appropriate for her age. For her comfort we will recheck TSH and free T4 today for reassurance that her levels are normal. I do not suspect she has a thyroid problem at this time. She does not need to see endocrinology.

## 2018-06-05 NOTE — Assessment & Plan Note (Signed)
Some improvement with 5 mg oxybutynin but wants to increase dose to see if it can help more. If effective she will call and we will increase dose to 10 mg XL oxybutynin.

## 2018-07-06 ENCOUNTER — Other Ambulatory Visit: Payer: Self-pay | Admitting: Cardiology

## 2018-07-06 ENCOUNTER — Telehealth: Payer: Self-pay | Admitting: Cardiology

## 2018-07-06 MED ORDER — WARFARIN SODIUM 5 MG PO TABS: ORAL_TABLET | ORAL | 0 refills | Status: DC

## 2018-07-06 NOTE — Telephone Encounter (Signed)
Spoke with the patient, she arrived and got schedule from a coumadin appointment on 1/9 due to no appointment slots on Friday. The patient expressed understanding about how to discontinue Eliquis.

## 2018-07-06 NOTE — Telephone Encounter (Signed)
Spoke with Dr. Radford Pax, the patient should start at 5 mg coumadin. Spoke with Coumadin clinic the patient is to overlap coumadin for 3 days with Eliquis and stop after day 3. She is going to be scheduled for a coumadin clinic appointment on Friday. Spoke with the patient, she expressed understanding about the coumadin instructions and is coming in today to get samples of Eliquis and she will be scheduled for her appointment at that time.

## 2018-07-06 NOTE — Telephone Encounter (Signed)
New Message    Pt c/o medication issue:   1. Name of Medication: Eliquies   2. How are you currently taking this medication (dosage and times per day)? 2.5mg    3. Are you having a reaction (difficulty breathing--STAT)? No  4. What is your medication issue? Patient was prescribed Eliquis but states it was too expensive and would like the generic brand which is Apixiean

## 2018-07-06 NOTE — Telephone Encounter (Signed)
Spoke with the patient, she cannot afford Eliqiuis and requested a cheaper alterative. The patient asked about Coumadin.

## 2018-07-09 ENCOUNTER — Ambulatory Visit (INDEPENDENT_AMBULATORY_CARE_PROVIDER_SITE_OTHER): Payer: Medicare Other

## 2018-07-09 DIAGNOSIS — Z7901 Long term (current) use of anticoagulants: Secondary | ICD-10-CM | POA: Diagnosis not present

## 2018-07-09 DIAGNOSIS — I4891 Unspecified atrial fibrillation: Secondary | ICD-10-CM

## 2018-07-09 LAB — POCT INR: INR: 1.1 — AB (ref 2.0–3.0)

## 2018-07-09 NOTE — Patient Instructions (Addendum)
Description   Take 1.5 tablets today, then resume same dosage 1 tablet daily.  Recheck on Tuesday.   A full discussion of the nature of anticoagulants has been carried out.  A benefit risk analysis has been presented to the patient, so that they understand the justification for choosing anticoagulation at this time. The need for frequent and regular monitoring, precise dosage adjustment and compliance is stressed.  Side effects of potential bleeding are discussed.  The patient should avoid any OTC items containing aspirin or ibuprofen, and should avoid great swings in general diet.  Avoid alcohol consumption.  Call if any signs of abnormal bleeding.

## 2018-07-14 ENCOUNTER — Ambulatory Visit (INDEPENDENT_AMBULATORY_CARE_PROVIDER_SITE_OTHER): Payer: Medicare Other

## 2018-07-14 DIAGNOSIS — I4891 Unspecified atrial fibrillation: Secondary | ICD-10-CM

## 2018-07-14 DIAGNOSIS — Z7901 Long term (current) use of anticoagulants: Secondary | ICD-10-CM

## 2018-07-14 LAB — POCT INR: INR: 1.6 — AB (ref 2.0–3.0)

## 2018-07-14 NOTE — Patient Instructions (Signed)
Take 1.5 tablets today, then resume same dosage 1 tablet daily.  Recheck in 1 week.

## 2018-07-21 ENCOUNTER — Ambulatory Visit (INDEPENDENT_AMBULATORY_CARE_PROVIDER_SITE_OTHER): Payer: Medicare Other

## 2018-07-21 DIAGNOSIS — I4891 Unspecified atrial fibrillation: Secondary | ICD-10-CM

## 2018-07-21 DIAGNOSIS — Z7901 Long term (current) use of anticoagulants: Secondary | ICD-10-CM | POA: Diagnosis not present

## 2018-07-21 LAB — POCT INR: INR: 1.7 — AB (ref 2.0–3.0)

## 2018-07-21 NOTE — Patient Instructions (Signed)
Please START NEW DOSAGE of 1 tablet daily except 1.5 tablets on Tuesdays & Saturdays.   Recheck in 2 weeks.

## 2018-07-29 ENCOUNTER — Other Ambulatory Visit: Payer: Self-pay | Admitting: Cardiology

## 2018-08-04 ENCOUNTER — Ambulatory Visit (INDEPENDENT_AMBULATORY_CARE_PROVIDER_SITE_OTHER): Payer: Medicare Other | Admitting: Pharmacist

## 2018-08-04 DIAGNOSIS — I4891 Unspecified atrial fibrillation: Secondary | ICD-10-CM | POA: Diagnosis not present

## 2018-08-04 DIAGNOSIS — Z7901 Long term (current) use of anticoagulants: Secondary | ICD-10-CM

## 2018-08-04 LAB — POCT INR: INR: 1.4 — AB (ref 2.0–3.0)

## 2018-08-04 NOTE — Patient Instructions (Signed)
Take 2 tablets tonight then pease START NEW DOSAGE of 1.5 tablet daily except 1 tablet on Monday, Wed and Friday   Recheck in 1 week.

## 2018-08-13 ENCOUNTER — Ambulatory Visit (INDEPENDENT_AMBULATORY_CARE_PROVIDER_SITE_OTHER): Payer: Medicare Other

## 2018-08-13 DIAGNOSIS — Z7901 Long term (current) use of anticoagulants: Secondary | ICD-10-CM | POA: Diagnosis not present

## 2018-08-13 DIAGNOSIS — I4891 Unspecified atrial fibrillation: Secondary | ICD-10-CM | POA: Diagnosis not present

## 2018-08-13 LAB — POCT INR: INR: 1.4 — AB (ref 2.0–3.0)

## 2018-08-13 NOTE — Patient Instructions (Signed)
Description   Take 2 tablets tonight then pease START NEW DOSAGE of 1.5 tablet daily except 1 tablet on Wednedays.   Recheck in 1 week.

## 2018-08-20 ENCOUNTER — Other Ambulatory Visit: Payer: Self-pay | Admitting: Cardiology

## 2018-08-21 ENCOUNTER — Ambulatory Visit (INDEPENDENT_AMBULATORY_CARE_PROVIDER_SITE_OTHER): Payer: Medicare Other | Admitting: *Deleted

## 2018-08-21 DIAGNOSIS — I4891 Unspecified atrial fibrillation: Secondary | ICD-10-CM

## 2018-08-21 DIAGNOSIS — Z7901 Long term (current) use of anticoagulants: Secondary | ICD-10-CM | POA: Diagnosis not present

## 2018-08-21 LAB — POCT INR: INR: 1.5 — AB (ref 2.0–3.0)

## 2018-08-21 NOTE — Patient Instructions (Addendum)
Description   Take 2 tablets tonight and 2 tablets tomorrow then pease START NEW DOSAGE of 1.5 tablets daily except 2 tablets on Tuesdays.  Recheck in 1 week. Coumadin Clinic [phone]

## 2018-08-28 ENCOUNTER — Ambulatory Visit (INDEPENDENT_AMBULATORY_CARE_PROVIDER_SITE_OTHER): Payer: Medicare Other | Admitting: Pharmacist

## 2018-08-28 DIAGNOSIS — I4891 Unspecified atrial fibrillation: Secondary | ICD-10-CM

## 2018-08-28 DIAGNOSIS — Z7901 Long term (current) use of anticoagulants: Secondary | ICD-10-CM

## 2018-08-28 LAB — POCT INR: INR: 1.7 — AB (ref 2.0–3.0)

## 2018-08-28 MED ORDER — WARFARIN SODIUM 5 MG PO TABS: ORAL_TABLET | ORAL | 2 refills | Status: DC

## 2018-08-28 NOTE — Patient Instructions (Signed)
Take 2 tablets tonight then please START NEW DOSAGE of 1.5 tablets daily except 2 tablets on Tuesdays and Thursdays.  Recheck in 2 weeks. Coumadin Clinic [phone]

## 2018-09-10 ENCOUNTER — Ambulatory Visit (INDEPENDENT_AMBULATORY_CARE_PROVIDER_SITE_OTHER): Payer: Medicare Other | Admitting: *Deleted

## 2018-09-10 ENCOUNTER — Other Ambulatory Visit: Payer: Self-pay

## 2018-09-10 DIAGNOSIS — Z7901 Long term (current) use of anticoagulants: Secondary | ICD-10-CM | POA: Diagnosis not present

## 2018-09-10 DIAGNOSIS — I4891 Unspecified atrial fibrillation: Secondary | ICD-10-CM

## 2018-09-10 LAB — POCT INR: INR: 2.2 (ref 2.0–3.0)

## 2018-09-10 NOTE — Patient Instructions (Signed)
Description   Continue taking 1.5 tablets daily except 2 tablets on Tuesdays and Thursdays.  Recheck in 2 weeks. Coumadin Clinic [phone]

## 2018-09-22 ENCOUNTER — Telehealth: Payer: Self-pay

## 2018-09-22 NOTE — Telephone Encounter (Signed)

## 2018-09-24 ENCOUNTER — Ambulatory Visit (INDEPENDENT_AMBULATORY_CARE_PROVIDER_SITE_OTHER): Payer: Medicare Other | Admitting: Pharmacist

## 2018-09-24 ENCOUNTER — Other Ambulatory Visit: Payer: Self-pay | Admitting: Internal Medicine

## 2018-09-24 ENCOUNTER — Other Ambulatory Visit: Payer: Self-pay

## 2018-09-24 DIAGNOSIS — I4891 Unspecified atrial fibrillation: Secondary | ICD-10-CM

## 2018-09-24 DIAGNOSIS — Z7901 Long term (current) use of anticoagulants: Secondary | ICD-10-CM

## 2018-09-24 LAB — POCT INR: INR: 2 (ref 2.0–3.0)

## 2018-10-14 ENCOUNTER — Telehealth: Payer: Self-pay

## 2018-10-14 NOTE — Telephone Encounter (Signed)

## 2018-10-15 ENCOUNTER — Ambulatory Visit (INDEPENDENT_AMBULATORY_CARE_PROVIDER_SITE_OTHER): Payer: Medicare Other | Admitting: Pharmacist

## 2018-10-15 ENCOUNTER — Other Ambulatory Visit: Payer: Self-pay

## 2018-10-15 DIAGNOSIS — Z7901 Long term (current) use of anticoagulants: Secondary | ICD-10-CM

## 2018-10-15 DIAGNOSIS — I4891 Unspecified atrial fibrillation: Secondary | ICD-10-CM | POA: Diagnosis not present

## 2018-10-15 LAB — POCT INR: INR: 2.8 (ref 2.0–3.0)

## 2018-11-18 ENCOUNTER — Telehealth: Payer: Self-pay

## 2018-11-18 NOTE — Telephone Encounter (Signed)
1. Do you currently have a fever?nno(yes = cancel and refer to pcp for e-visit) 2. Have you recently travelled on a cruise, internationally, or to [place], [place], [place], [place], [place], or [place], [place] [employer]) ? no (yes = cancel, stay home, monitor symptoms, and contact pcp or initiate e-visit if symptoms develop) 3. Have you been in contact with someone that is currently pending confirmation of Covid19 testing or has been confirmed to have the [place] virus?  no (yes = cancel, stay home, away from tested individual, monitor symptoms, and contact pcp or initiate e-visit if symptoms develop) 4. Are you currently experiencing fatigue or cough? no (yes = pt should be prepared to have a mask placed at the time of their visit).  Pt. Advised that we are restricting visitors at this time and anyone present in the vehicle should meet the above criteria as well. Advised that visit will be at curbside for finger stick ONLY and will receive call with instructions. Pt also advised to please bring own pen for signature of arrival document.  \

## 2018-11-19 ENCOUNTER — Ambulatory Visit (INDEPENDENT_AMBULATORY_CARE_PROVIDER_SITE_OTHER): Payer: Medicare Other | Admitting: Pharmacist

## 2018-11-19 DIAGNOSIS — I4891 Unspecified atrial fibrillation: Secondary | ICD-10-CM | POA: Diagnosis not present

## 2018-11-19 DIAGNOSIS — Z7901 Long term (current) use of anticoagulants: Secondary | ICD-10-CM | POA: Diagnosis not present

## 2018-11-19 LAB — POCT INR: INR: 2.4 (ref 2.0–3.0)

## 2018-11-20 ENCOUNTER — Other Ambulatory Visit: Payer: Self-pay

## 2018-12-01 ENCOUNTER — Other Ambulatory Visit: Payer: Self-pay | Admitting: Cardiology

## 2018-12-11 ENCOUNTER — Telehealth: Payer: Self-pay | Admitting: Cardiology

## 2018-12-11 MED ORDER — DILTIAZEM HCL ER COATED BEADS 120 MG PO CP24: 120.0000 mg | ORAL_CAPSULE | Freq: Every day | ORAL | 1 refills | Status: DC

## 2018-12-11 NOTE — Telephone Encounter (Signed)
°*  STAT* If patient is at the pharmacy, call can be transferred to refill team.   1. Which medications need to be refilled? (please list name of each medication and dose if known) CARTIA 120 mgxt caps  Pt stated she needs a discount on this medication // or a Generic.   2. Which pharmacy/location (including street and city if local pharmacy) is medication to be sent to?*  3. Do they need a 30 day or 90 day supply? 90 day need discount

## 2018-12-11 NOTE — Telephone Encounter (Signed)
Pt's medication was sent to pt's pharmacy as requested. Confirmation received.  °

## 2018-12-24 ENCOUNTER — Telehealth: Payer: Self-pay

## 2018-12-24 NOTE — Telephone Encounter (Signed)

## 2018-12-27 ENCOUNTER — Other Ambulatory Visit: Payer: Self-pay | Admitting: Internal Medicine

## 2018-12-30 ENCOUNTER — Other Ambulatory Visit: Payer: Self-pay | Admitting: Cardiology

## 2018-12-30 DIAGNOSIS — I10 Essential (primary) hypertension: Secondary | ICD-10-CM

## 2018-12-30 DIAGNOSIS — Z0000 Encounter for general adult medical examination without abnormal findings: Secondary | ICD-10-CM

## 2018-12-31 ENCOUNTER — Other Ambulatory Visit: Payer: Self-pay

## 2018-12-31 ENCOUNTER — Ambulatory Visit (INDEPENDENT_AMBULATORY_CARE_PROVIDER_SITE_OTHER): Payer: Medicare Other | Admitting: *Deleted

## 2018-12-31 ENCOUNTER — Encounter (INDEPENDENT_AMBULATORY_CARE_PROVIDER_SITE_OTHER): Payer: Self-pay

## 2018-12-31 DIAGNOSIS — Z7901 Long term (current) use of anticoagulants: Secondary | ICD-10-CM

## 2018-12-31 DIAGNOSIS — I4891 Unspecified atrial fibrillation: Secondary | ICD-10-CM

## 2018-12-31 LAB — POCT INR: INR: 2.7 (ref 2.0–3.0)

## 2018-12-31 NOTE — Patient Instructions (Signed)
Description   Continue taking 1.5 tablets daily except 2 tablets on Tuesdays and Thursdays.  Recheck in 6 weeks. Coumadin Clinic [phone].

## 2019-01-06 ENCOUNTER — Other Ambulatory Visit: Payer: Self-pay | Admitting: *Deleted

## 2019-01-06 ENCOUNTER — Other Ambulatory Visit: Payer: Self-pay | Admitting: Cardiology

## 2019-01-06 MED ORDER — WARFARIN SODIUM 5 MG PO TABS: ORAL_TABLET | ORAL | 2 refills | Status: DC

## 2019-01-06 NOTE — Telephone Encounter (Signed)
Refill sent in

## 2019-01-06 NOTE — Addendum Note (Signed)
Addended by: SUPPLE, MEGAN E on: 01/06/2019 04:01 PM   Modules accepted: Orders

## 2019-01-06 NOTE — Telephone Encounter (Signed)
Cynthia Vargas [phone], medication refill for Coumadin 5mg .

## 2019-02-11 ENCOUNTER — Ambulatory Visit (INDEPENDENT_AMBULATORY_CARE_PROVIDER_SITE_OTHER): Payer: Medicare Other | Admitting: *Deleted

## 2019-02-11 ENCOUNTER — Other Ambulatory Visit: Payer: Self-pay

## 2019-02-11 DIAGNOSIS — I4891 Unspecified atrial fibrillation: Secondary | ICD-10-CM

## 2019-02-11 DIAGNOSIS — Z7901 Long term (current) use of anticoagulants: Secondary | ICD-10-CM | POA: Diagnosis not present

## 2019-02-11 LAB — POCT INR: INR: 3.7 — AB (ref 2.0–3.0)

## 2019-02-11 NOTE — Patient Instructions (Signed)
Description   Hold today, then continue taking 1.5 tablets daily except 2 tablets on Tuesdays and Thursdays.  Recheck in 3 weeks. Coumadin Clinic [phone].

## 2019-03-04 ENCOUNTER — Ambulatory Visit (INDEPENDENT_AMBULATORY_CARE_PROVIDER_SITE_OTHER): Payer: Medicare Other | Admitting: *Deleted

## 2019-03-04 ENCOUNTER — Other Ambulatory Visit: Payer: Self-pay

## 2019-03-04 DIAGNOSIS — I4891 Unspecified atrial fibrillation: Secondary | ICD-10-CM

## 2019-03-04 DIAGNOSIS — Z7901 Long term (current) use of anticoagulants: Secondary | ICD-10-CM

## 2019-03-04 LAB — POCT INR: INR: 2.3 (ref 2.0–3.0)

## 2019-03-04 NOTE — Patient Instructions (Signed)
Description   Continue taking 1.5 tablets daily except 2 tablets on Tuesdays and Thursdays.  Recheck in 4 weeks. Coumadin Clinic [phone].

## 2019-03-22 ENCOUNTER — Ambulatory Visit (INDEPENDENT_AMBULATORY_CARE_PROVIDER_SITE_OTHER): Payer: Medicare Other | Admitting: *Deleted

## 2019-03-22 DIAGNOSIS — Z0000 Encounter for general adult medical examination without abnormal findings: Secondary | ICD-10-CM | POA: Diagnosis not present

## 2019-03-22 NOTE — Progress Notes (Signed)
Subjective:   Cynthia Vargas is a 85 y.o. female who presents for Medicare Annual (Subsequent) preventive examination. I connected with patient by a telephone and verified that I am speaking with the correct person using two identifiers. Patient stated full name and DOB. Patient gave permission to continue with telephonic visit. Patient's location was at home and Nurse's location was at [place] office.   Review of Systems:   Cardiac Risk Factors include: advanced age (>29men, >31 women);hypertension Sleep patterns: feels rested on waking, gets up 2 times nightly to void and sleeps 7-8hours nightly.   Home Safety/Smoke Alarms: Feels safe in home. Smoke alarms in place.  Living environment; residence and Firearm Safety: 2-story house. Lives alone, no needs for DME, good support system Seat Belt Safety/Bike Helmet: Wears seat belt.    Objective:     Vitals: There were no vitals taken for this visit.  There is no height or weight on file to calculate BMI.  Advanced Directives 03/22/2019 03/16/2018 03/10/2017 09/28/2015 03/30/2013 03/30/2013  Does Patient Have a Medical Advance Directive? No No No No Patient does not have advance directive Patient does not have advance directive;Patient would like information  Does patient want to make changes to medical advance directive? No - Patient declined Yes (ED - Information included in AVS) - - - -  Would patient like information on creating a medical advance directive? - - Yes (ED - Information included in AVS) No - patient declined information - -    Tobacco Social History   Tobacco Use  Smoking Status Never Smoker  Smokeless Tobacco Never Used     Counseling given: Not Answered  Past Medical History:  Diagnosis Date  . Acute pericarditis 11/01/2015   a. moderate pericardial effusion by echo 09/2015 - signficantly improved with trivial effusion on repeat echo after NSAIDS and colchicine 10/2015.  [name] Aortic insufficiency   . CAD (coronary  artery disease), native coronary artery    0-25% ostial LM and 40-69% pLAD with normal FFR by coronary CTA 12/2017  . CALCANEAL FRACTURE, RIGHT 11/04/2007  . Essential hypertension   . GERD (gastroesophageal reflux disease)   . Hyperlipidemia LDL goal <70 02/08/2016  . KNEE PAIN, RIGHT, CHRONIC 03/19/2010  . Lung nodule    a. found on CT 2017 - possible stage IIB carcinoma.  . Mitral regurgitation   . Nonobstructive atherosclerosis of coronary artery   . OSTEOPOROSIS 05/02/2007  . PEPTIC ULCER DISEASE 05/02/2007   Past Surgical History:  Procedure Laterality Date  . Calcaneal fracture, right    . CHOLECYSTECTOMY    . DILATION AND CURETTAGE OF UTERUS    . ESOPHAGOGASTRODUODENOSCOPY N/A 03/30/2013   Procedure: ESOPHAGOGASTRODUODENOSCOPY (EGD);  Surgeon: Jerene Bears, MD;  Location: [facility];  Service: Endoscopy;  Laterality: N/A;  . FUDUCIAL PLACEMENT N/A 09/29/2015   Procedure:  PLACEMENT OF FUDUCIAL Marker times three;  Surgeon: Grace Isaac, MD;  Location: [place];  Service: Thoracic;  Laterality: N/A;  . LUNG BIOPSY N/A 09/29/2015   Procedure: LUNG BIOPSY;  Surgeon: Grace Isaac, MD;  Location: [place] [facility];  Service: Thoracic;  Laterality: N/A;  . ORIF HIP FRACTURE    . TONSILLECTOMY    . VIDEO BRONCHOSCOPY WITH ENDOBRONCHIAL NAVIGATION N/A 09/29/2015   Procedure: VIDEO BRONCHOSCOPY WITH ENDOBRONCHIAL NAVIGATION;  Surgeon: Grace Isaac, MD;  Location: [facility];  Service: Thoracic;  Laterality: N/A;   Family History  Problem Relation Age of Onset  . Cancer Father   . Heart disease  Mother   . Diabetes Neg Hx   . Hyperlipidemia Neg Hx   . Hypertension Neg Hx    Social History   Socioeconomic History  . Marital status: Single    Spouse name: Not on file  . Number of children: 1  . Years of education: 89  . Highest education level: Not on file  Occupational History  . Occupation: reitred  Social Needs  . Financial resource strain: Not hard at all  . Food insecurity     Worry: Never true    Inability: Never true  . Transportation needs    Medical: No    Non-medical: No  Tobacco Use  . Smoking status: Never Smoker  . Smokeless tobacco: Never Used  Substance and Sexual Activity  . Alcohol use: Yes    Alcohol/week: 1.0 standard drinks    Types: 1 Glasses of Raylei Losurdo per week    Comment: very rarely on special occassionally  . Drug use: No  . Sexual activity: Never  Lifestyle  . Physical activity    Days per week: 4 days    Minutes per session: 40 min  . Stress: Not at all  Relationships  . Social connections    Talks on phone: More than three times a week    Gets together: More than three times a week    Attends religious service: 1 to 4 times per year    Active member of club or organization: Yes    Attends meetings of clubs or organizations: More than 4 times per year    Relationship status: Not on file  Other Topics Concern  . Not on file  Social History Narrative   [employer] degrees in Development worker, community. married for 2 years then single . 1 son - Harvie Junior [place]. 2 daughters-put up for adoption. Lives alone and is independent in ADL's. works at [employer] until injured.    Outpatient Encounter Medications as of 03/22/2019  Medication Sig  . alendronate (FOSAMAX) 70 MG tablet Take 1 tablet (70 mg total) by mouth every 7 (seven) days. Take with a full glass of water on an empty stomach.  . Ascorbic Acid (VITAMIN C ER PO) Take 1 tablet by mouth daily.  [name] atorvastatin (LIPITOR) 20 MG tablet Take 1 tablet (20 mg total) by mouth daily at 6 PM. Please make annual appt with Dr. Radford Pax for future refills. Thank you  . diltiazem (CARDIZEM CD) 120 MG 24 hr capsule Take 1 capsule (120 mg total) by mouth daily.  . Multiple Vitamins-Minerals (MULTIVITAMIN PO) Take 1 tablet by mouth.  . nitroGLYCERIN (NITROSTAT) 0.4 MG SL tablet Place 0.4 mg under the tongue every 5 (five) minutes as needed for chest pain.  [name] warfarin (COUMADIN) 5 MG tablet Take 1.5-2 tablets daily  as directed by [facility]ADIN CLINIC  . [DISCONTINUED] oxybutynin (DITROPAN-XL) 5 MG 24 hr tablet TAKE 1 TABLET(5 MG) BY MOUTH AT BEDTIME (Patient not taking: Reported on 03/22/2019)   No facility-administered encounter medications on file as of 03/22/2019.     Activities of Daily Living In your present state of health, do you have any difficulty performing the following activities: 03/22/2019  Hearing? N  Vision? N  Difficulty concentrating or making decisions? N  Walking or climbing stairs? N  Dressing or bathing? N  Doing errands, shopping? N  Preparing Food and eating ? N  Using the Toilet? N  In the past six months, have you accidently leaked urine? N  Do you have problems with loss  of bowel control? N  Managing your Medications? N  Managing your Finances? N  Housekeeping or managing your Housekeeping? N  Some recent data might be hidden    Patient Care Team: Hoyt Koch, MD as PCP - General (Internal Medicine) Sueanne Margarita, MD as PCP - Cardiology (Cardiology) Sueanne Margarita, MD as Consulting Physician (Cardiology)    Assessment:   This is a routine wellness examination for [place]. Physical assessment deferred to PCP.  Exercise Activities and Dietary recommendations Current Exercise Habits: Home exercise routine, Type of exercise: walking, Time (Minutes): 30, Frequency (Times/Week): 5, Weekly Exercise (Minutes/Week): 150, Intensity: Mild, Exercise limited by: orthopedic condition(s) Diet (meal preparation, eat out, water intake, caffeinated beverages, dairy products, fruits and vegetables): in general, a "healthy" diet  . Reports having a good appetite.   Reviewed heart healthy diet. Encouraged patient to increase daily water and healthy fluid intake.  Goals    . Be as active and as independent as possible     Enjoy  life, go to the park, travel, and walk my dog.     . Patient Stated     I would like to go and tee off at the golf course, go to the park and  paint, make portraits of my dog and cat.       Fall Risk Fall Risk  03/22/2019 03/16/2018 03/10/2017 01/20/2017  Falls in the past year? 0 Yes No No  Comment - - - Emmi Telephone Survey: data to providers prior to load  Number falls in past yr: 0 1 - -  Injury with Fall? 0 No - -  Risk for fall due to : Impaired balance/gait Impaired mobility;Impaired balance/gait - -  Follow up Falls prevention discussed Falls prevention discussed - -   Is the patient's home free of loose throw rugs in walkways, pet beds, electrical cords, etc?   yes      Grab bars in the bathroom? no, states she does not feel that she needs any at this time.      Handrails on the stairs?   yes      Adequate lighting?   yes  Depression Screen PHQ 2/9 Scores 03/22/2019 03/16/2018 03/10/2017  PHQ - 2 Score 1 0 0  PHQ- 9 Score 2 3 3      Cognitive Function MMSE - Mini Mental State Exam 03/22/2019 03/16/2018 03/10/2017  Not completed: Refused Refused -  Orientation to time - - 5  Orientation to Place - - 5  Registration - - 3  Attention/ Calculation - - 5  Recall - - 1  Language- name 2 objects - - 2  Language- repeat - - 1  Language- follow 3 step command - - 3  Language- read & follow direction - - 1  Write a sentence - - 1  Copy design - - 1  Total score - - 28        Immunization History  Administered Date(s) Administered  . Influenza Whole 03/19/2010  . Influenza, High Dose Seasonal PF 07/04/2016, 03/10/2017, 03/16/2018  . Influenza, Seasonal, Injecte, Preservative Fre 08/03/2012  . Influenza,inj,Quad PF,6+ Mos 03/24/2013  . Influenza-Unspecified 07/19/2013  . Pneumococcal Conjugate-13 04/06/2018  . Tetanus 08/03/2012   Screening Tests Health Maintenance  Topic Date Due  . INFLUENZA VACCINE  01/30/2019  . PNA vac Low Risk Adult (2 of 2 - PPSV23) 04/07/2019  . TETANUS/TDAP  08/03/2022  . DEXA SCAN  Completed      Plan:  Reviewed health maintenance screenings with patient today and relevant  education, vaccines, and/or referrals were provided.   Continue to eat heart healthy diet (full of fruits, vegetables, whole grains, lean protein, water--limit salt, fat, and sugar intake) and increase physical activity as tolerated.  Continue doing brain stimulating activities (puzzles, reading, adult coloring books, staying active) to keep memory sharp.   I have personally reviewed and noted the following in the patient's chart:   . Medical and social history . Use of alcohol, tobacco or illicit drugs  . Current medications and supplements . Functional ability and status . Nutritional status . Physical activity . Advanced directives . List of other physicians . Screenings to include cognitive, depression, and falls . Referrals and appointments  In addition, I have reviewed and discussed with patient certain preventive protocols, quality metrics, and best practice recommendations. A written personalized care plan for preventive services as well as general preventive health recommendations were provided to patient.     Michiel Cowboy, RN  03/22/2019

## 2019-03-22 NOTE — Progress Notes (Signed)
Medical screening examination/treatment/procedure(s) were performed by non-physician practitioner and as supervising physician I was immediately available for consultation/collaboration. I agree with above. Cabria A Sylvia Kondracki, MD 

## 2019-03-30 ENCOUNTER — Other Ambulatory Visit: Payer: Self-pay | Admitting: Cardiology

## 2019-03-30 DIAGNOSIS — Z23 Encounter for immunization: Secondary | ICD-10-CM | POA: Diagnosis not present

## 2019-03-30 DIAGNOSIS — I10 Essential (primary) hypertension: Secondary | ICD-10-CM

## 2019-03-30 DIAGNOSIS — Z0000 Encounter for general adult medical examination without abnormal findings: Secondary | ICD-10-CM

## 2019-03-30 MED ORDER — ATORVASTATIN CALCIUM 20 MG PO TABS: 20.0000 mg | ORAL_TABLET | Freq: Every day | ORAL | 0 refills | Status: DC

## 2019-04-07 ENCOUNTER — Other Ambulatory Visit: Payer: Self-pay

## 2019-04-07 ENCOUNTER — Ambulatory Visit (INDEPENDENT_AMBULATORY_CARE_PROVIDER_SITE_OTHER): Payer: Medicare Other | Admitting: *Deleted

## 2019-04-07 DIAGNOSIS — I4891 Unspecified atrial fibrillation: Secondary | ICD-10-CM

## 2019-04-07 DIAGNOSIS — Z7901 Long term (current) use of anticoagulants: Secondary | ICD-10-CM | POA: Diagnosis not present

## 2019-04-07 LAB — POCT INR: INR: 2.5 (ref 2.0–3.0)

## 2019-04-07 NOTE — Patient Instructions (Signed)
Description   Continue taking 1.5 tablets daily except 2 tablets on Tuesdays and Thursdays.  Recheck in 5 weeks. [facility] [phone].

## 2019-04-30 ENCOUNTER — Other Ambulatory Visit: Payer: Self-pay

## 2019-04-30 MED ORDER — WARFARIN SODIUM 5 MG PO TABS: ORAL_TABLET | ORAL | 2 refills | Status: DC

## 2019-05-12 ENCOUNTER — Ambulatory Visit (INDEPENDENT_AMBULATORY_CARE_PROVIDER_SITE_OTHER): Payer: Medicare Other | Admitting: *Deleted

## 2019-05-12 ENCOUNTER — Other Ambulatory Visit: Payer: Self-pay

## 2019-05-12 DIAGNOSIS — I4891 Unspecified atrial fibrillation: Secondary | ICD-10-CM

## 2019-05-12 DIAGNOSIS — Z5181 Encounter for therapeutic drug level monitoring: Secondary | ICD-10-CM | POA: Diagnosis not present

## 2019-05-12 DIAGNOSIS — Z7901 Long term (current) use of anticoagulants: Secondary | ICD-10-CM | POA: Diagnosis not present

## 2019-05-12 LAB — POCT INR: INR: 2.5 (ref 2.0–3.0)

## 2019-05-12 NOTE — Patient Instructions (Signed)
Description   Continue taking 1.5 tablets daily except 2 tablets on Tuesdays and Thursdays.  Recheck in 6 weeks. [facility] [phone].

## 2019-05-25 ENCOUNTER — Encounter: Payer: Self-pay | Admitting: Internal Medicine

## 2019-05-25 ENCOUNTER — Other Ambulatory Visit (INDEPENDENT_AMBULATORY_CARE_PROVIDER_SITE_OTHER): Payer: Medicare Other

## 2019-05-25 ENCOUNTER — Other Ambulatory Visit: Payer: Self-pay

## 2019-05-25 ENCOUNTER — Ambulatory Visit (INDEPENDENT_AMBULATORY_CARE_PROVIDER_SITE_OTHER): Payer: Medicare Other | Admitting: Internal Medicine

## 2019-05-25 VITALS — BP 120/70 | HR 69 | Temp 98.0°F | Ht 62.0 in | Wt 106.0 lb

## 2019-05-25 DIAGNOSIS — E785 Hyperlipidemia, unspecified: Secondary | ICD-10-CM | POA: Diagnosis not present

## 2019-05-25 DIAGNOSIS — I10 Essential (primary) hypertension: Secondary | ICD-10-CM

## 2019-05-25 DIAGNOSIS — M810 Age-related osteoporosis without current pathological fracture: Secondary | ICD-10-CM

## 2019-05-25 DIAGNOSIS — R5383 Other fatigue: Secondary | ICD-10-CM

## 2019-05-25 DIAGNOSIS — R7989 Other specified abnormal findings of blood chemistry: Secondary | ICD-10-CM

## 2019-05-25 LAB — LIPID PANEL
Cholesterol: 156 mg/dL (ref 0–200)
HDL: 88.1 mg/dL (ref >39.00)
LDL Cholesterol: 64 mg/dL (ref 0–99)
NonHDL: 67.71
Total CHOL/HDL Ratio: 2
Triglycerides: 20 mg/dL (ref 0.0–149.0)
VLDL: 4 mg/dL (ref 0.0–40.0)

## 2019-05-25 LAB — COMPREHENSIVE METABOLIC PANEL
ALT: 18 U/L (ref 0–35)
AST: 28 U/L (ref 0–37)
Albumin: 4 g/dL (ref 3.5–5.2)
Alkaline Phosphatase: 41 U/L (ref 39–117)
BUN: 13 mg/dL (ref 6–23)
CO2: 30 mEq/L (ref 19–32)
Calcium: 8.9 mg/dL (ref 8.4–10.5)
Chloride: 103 mEq/L (ref 96–112)
Creatinine, Ser: 0.79 mg/dL (ref 0.40–1.20)
GFR: 69.11 mL/min (ref >60.00)
Glucose, Bld: 121 mg/dL — ABNORMAL HIGH (ref 70–99)
Potassium: 3.4 mEq/L — ABNORMAL LOW (ref 3.5–5.1)
Sodium: 139 mEq/L (ref 135–145)
Total Bilirubin: 0.4 mg/dL (ref 0.2–1.2)
Total Protein: 6.5 g/dL (ref 6.0–8.3)

## 2019-05-25 LAB — CBC
HCT: 37.1 % (ref 36.0–46.0)
Hemoglobin: 12.3 g/dL (ref 12.0–15.0)
MCHC: 33.2 g/dL (ref 30.0–36.0)
MCV: 93 fl (ref 78.0–100.0)
Platelets: 172 10*3/uL (ref 150.0–400.0)
RBC: 3.99 Mil/uL (ref 3.87–5.11)
RDW: 14 % (ref 11.5–15.5)
WBC: 3.5 10*3/uL — ABNORMAL LOW (ref 4.0–10.5)

## 2019-05-25 LAB — T4, FREE: Free T4: 0.8 ng/dL (ref 0.60–1.60)

## 2019-05-25 LAB — TSH: TSH: 4.93 u[IU]/mL — ABNORMAL HIGH (ref 0.35–4.50)

## 2019-05-25 NOTE — Patient Instructions (Addendum)
We will check the labs today.    Health Maintenance, Female Adopting a healthy lifestyle and getting preventive care are important in promoting health and wellness. Ask your health care provider about:  The right schedule for you to have regular tests and exams.  Things you can do on your own to prevent diseases and keep yourself healthy. What should I know about diet, weight, and exercise? Eat a healthy diet   Eat a diet that includes plenty of vegetables, fruits, low-fat dairy products, and lean protein.  Do not eat a lot of foods that are high in solid fats, added sugars, or sodium. Maintain a healthy weight Body mass index (BMI) is used to identify weight problems. It estimates body fat based on height and weight. Your health care provider can help determine your BMI and help you achieve or maintain a healthy weight. Get regular exercise Get regular exercise. This is one of the most important things you can do for your health. Most adults should:  Exercise for at least 150 minutes each week. The exercise should increase your heart rate and make you sweat (moderate-intensity exercise).  Do strengthening exercises at least twice a week. This is in addition to the moderate-intensity exercise.  Spend less time sitting. Even light physical activity can be beneficial. Watch cholesterol and blood lipids Have your blood tested for lipids and cholesterol at 83 years of age, then have this test every 5 years. Have your cholesterol levels checked more often if:  Your lipid or cholesterol levels are high.  You are older than 83 years of age.  You are at high risk for heart disease. What should I know about cancer screening? Depending on your health history and family history, you may need to have cancer screening at various ages. This may include screening for:  Breast cancer.  Cervical cancer.  Colorectal cancer.  Skin cancer.  Lung cancer. What should I know about heart  disease, diabetes, and high blood pressure? Blood pressure and heart disease  High blood pressure causes heart disease and increases the risk of stroke. This is more likely to develop in people who have high blood pressure readings, are of African descent, or are overweight.  Have your blood pressure checked: ? Every 3-5 years if you are 59-103 years of age. ? Every year if you are 42 years old or older. Diabetes Have regular diabetes screenings. This checks your fasting blood sugar level. Have the screening done:  Once every three years after age 26 if you are at a normal weight and have a low risk for diabetes.  More often and at a younger age if you are overweight or have a high risk for diabetes. What should I know about preventing infection? Hepatitis B If you have a higher risk for hepatitis B, you should be screened for this virus. Talk with your health care provider to find out if you are at risk for hepatitis B infection. Hepatitis C Testing is recommended for:  Everyone born from [birth year] through 1963.  Anyone with known risk factors for hepatitis C. Sexually transmitted infections (STIs)  Get screened for STIs, including gonorrhea and chlamydia, if: ? You are sexually active and are younger than 83 years of age. ? You are older than 83 years of age and your health care provider tells you that you are at risk for this type of infection. ? Your sexual activity has changed since you were last screened, and you are at increased risk for  chlamydia or gonorrhea. Ask your health care provider if you are at risk.  Ask your health care provider about whether you are at high risk for HIV. Your health care provider may recommend a prescription medicine to help prevent HIV infection. If you choose to take medicine to prevent HIV, you should first get tested for HIV. You should then be tested every 3 months for as long as you are taking the medicine. Pregnancy  If you are about to stop  having your period (premenopausal) and you may become pregnant, seek counseling before you get pregnant.  Take 400 to 800 micrograms (mcg) of folic acid every day if you become pregnant.  Ask for birth control (contraception) if you want to prevent pregnancy. Osteoporosis and menopause Osteoporosis is a disease in which the bones lose minerals and strength with aging. This can result in bone fractures. If you are 56 years old or older, or if you are at risk for osteoporosis and fractures, ask your health care provider if you should:  Be screened for bone loss.  Take a calcium or vitamin D supplement to lower your risk of fractures.  Be given hormone replacement therapy (HRT) to treat symptoms of menopause. Follow these instructions at home: Lifestyle  Do not use any products that contain nicotine or tobacco, such as cigarettes, e-cigarettes, and chewing tobacco. If you need help quitting, ask your health care provider.  Do not use street drugs.  Do not share needles.  Ask your health care provider for help if you need support or information about quitting drugs. Alcohol use  Do not drink alcohol if: ? Your health care provider tells you not to drink. ? You are pregnant, may be pregnant, or are planning to become pregnant.  If you drink alcohol: ? Limit how much you use to 0-1 drink a day. ? Limit intake if you are breastfeeding.  Be aware of how much alcohol is in your drink. In the [place] one drink equals one 12 oz bottle of beer (355 mL), one 5 oz glass of wine (148 mL), or one 1 oz glass of hard liquor (44 mL). General instructions  Schedule regular health, dental, and eye exams.  Stay current with your vaccines.  Tell your health care provider if: ? You often feel depressed. ? You have ever been abused or do not feel safe at home. Summary  Adopting a healthy lifestyle and getting preventive care are important in promoting health and wellness.  Follow your health  care provider's instructions about healthy diet, exercising, and getting tested or screened for diseases.  Follow your health care provider's instructions on monitoring your cholesterol and blood pressure. This information is not intended to replace advice given to you by your health care provider. Make sure you discuss any questions you have with your health care provider. Document Released: 12/31/2010 Document Revised: 06/10/2018 Document Reviewed: 06/10/2018 Elsevier Patient Education  2020 [employer].

## 2019-05-25 NOTE — Progress Notes (Signed)
   Subjective:   Patient ID: Cynthia Vargas, female    DOB: 1931/09/15, 85 y.o.   MRN: [id number]  HPI The patient is an 85 YO female coming in for follow up cholesterol (taking lipitor 20 mg daily, denies side effects, denies chest pains or SOB or stroke symptoms) and abnormal TSH (previously high, with the A fib needs monitoring, not on replacement thyroid hormone) and osteoporosis (taking fosamax, denies recent fall or fracture, denies side effects).   Review of Systems  Constitutional: Negative.   HENT: Negative.   Eyes: Negative.   Respiratory: Negative for cough, chest tightness and shortness of breath.   Cardiovascular: Negative for chest pain, palpitations and leg swelling.  Gastrointestinal: Negative for abdominal distention, abdominal pain, constipation, diarrhea, nausea and vomiting.  Musculoskeletal: Negative.   Skin: Negative.   Neurological: Negative.   Psychiatric/Behavioral: Negative.     Objective:  Physical Exam Constitutional:      Appearance: She is well-developed.  HENT:     Head: Normocephalic and atraumatic.  Neck:     Musculoskeletal: Normal range of motion.  Cardiovascular:     Rate and Rhythm: Normal rate.  Pulmonary:     Effort: Pulmonary effort is normal. No respiratory distress.     Breath sounds: Normal breath sounds. No wheezing or rales.  Abdominal:     General: Bowel sounds are normal. There is no distension.     Palpations: Abdomen is soft.     Tenderness: There is no abdominal tenderness. There is no rebound.  Skin:    General: Skin is warm and dry.  Neurological:     Mental Status: She is alert and oriented to person, place, and time.     Coordination: Coordination normal.     Vitals:   05/25/19 1527  BP: 120/70  Pulse: 69  Temp: 98 F (36.7 C)  TempSrc: Oral  SpO2: 97%  Weight: 106 lb (48.1 kg)  Height: 5\' 2"  (1.575 m)    This visit occurred during the SARS-CoV-2 public health emergency.  Safety protocols were in place,  including screening questions prior to the visit, additional usage of staff PPE, and extensive cleaning of exam room while observing appropriate contact time as indicated for disinfecting solutions.   Assessment & Plan:

## 2019-05-26 NOTE — Assessment & Plan Note (Signed)
Taking lipitor 20 mg daily. Checking lipid panel and adjust as needed.

## 2019-05-26 NOTE — Assessment & Plan Note (Signed)
Taking fosamax weekly. No current fracture.

## 2019-05-26 NOTE — Assessment & Plan Note (Signed)
BP at goal on diltiazem daily.

## 2019-05-26 NOTE — Assessment & Plan Note (Signed)
Checking TSH and free T4, has not needed replacement at this time.

## 2019-06-23 ENCOUNTER — Ambulatory Visit (INDEPENDENT_AMBULATORY_CARE_PROVIDER_SITE_OTHER): Payer: Medicare Other | Admitting: *Deleted

## 2019-06-23 ENCOUNTER — Other Ambulatory Visit: Payer: Self-pay

## 2019-06-23 DIAGNOSIS — Z7901 Long term (current) use of anticoagulants: Secondary | ICD-10-CM

## 2019-06-23 DIAGNOSIS — I4891 Unspecified atrial fibrillation: Secondary | ICD-10-CM | POA: Diagnosis not present

## 2019-06-23 DIAGNOSIS — Z5181 Encounter for therapeutic drug level monitoring: Secondary | ICD-10-CM | POA: Diagnosis not present

## 2019-06-23 LAB — POCT INR: INR: 3.4 — AB (ref 2.0–3.0)

## 2019-06-23 NOTE — Patient Instructions (Signed)
Description   Hold today, then continue taking 1.5 tablets daily except 2 tablets on Tuesdays and Thursdays.  Recheck in 3 weeks. [facility] [phone].

## 2019-06-29 ENCOUNTER — Other Ambulatory Visit: Payer: Self-pay | Admitting: Cardiology

## 2019-06-29 DIAGNOSIS — Z0000 Encounter for general adult medical examination without abnormal findings: Secondary | ICD-10-CM

## 2019-06-29 DIAGNOSIS — I10 Essential (primary) hypertension: Secondary | ICD-10-CM

## 2019-07-13 ENCOUNTER — Ambulatory Visit (INDEPENDENT_AMBULATORY_CARE_PROVIDER_SITE_OTHER): Payer: Medicare Other | Admitting: *Deleted

## 2019-07-13 ENCOUNTER — Other Ambulatory Visit: Payer: Self-pay

## 2019-07-13 ENCOUNTER — Encounter: Payer: Self-pay | Admitting: Cardiology

## 2019-07-13 ENCOUNTER — Ambulatory Visit (INDEPENDENT_AMBULATORY_CARE_PROVIDER_SITE_OTHER): Payer: Medicare Other | Admitting: Cardiology

## 2019-07-13 VITALS — BP 132/74 | HR 71 | Ht 62.0 in | Wt 106.6 lb

## 2019-07-13 DIAGNOSIS — Z7901 Long term (current) use of anticoagulants: Secondary | ICD-10-CM | POA: Diagnosis not present

## 2019-07-13 DIAGNOSIS — Z5181 Encounter for therapeutic drug level monitoring: Secondary | ICD-10-CM

## 2019-07-13 DIAGNOSIS — I480 Paroxysmal atrial fibrillation: Secondary | ICD-10-CM | POA: Diagnosis not present

## 2019-07-13 DIAGNOSIS — I2510 Atherosclerotic heart disease of native coronary artery without angina pectoris: Secondary | ICD-10-CM | POA: Diagnosis not present

## 2019-07-13 DIAGNOSIS — I3139 Other pericardial effusion (noninflammatory): Secondary | ICD-10-CM

## 2019-07-13 DIAGNOSIS — I272 Pulmonary hypertension, unspecified: Secondary | ICD-10-CM

## 2019-07-13 DIAGNOSIS — I10 Essential (primary) hypertension: Secondary | ICD-10-CM

## 2019-07-13 DIAGNOSIS — E785 Hyperlipidemia, unspecified: Secondary | ICD-10-CM

## 2019-07-13 DIAGNOSIS — I351 Nonrheumatic aortic (valve) insufficiency: Secondary | ICD-10-CM | POA: Diagnosis not present

## 2019-07-13 DIAGNOSIS — I313 Pericardial effusion (noninflammatory): Secondary | ICD-10-CM | POA: Diagnosis not present

## 2019-07-13 DIAGNOSIS — I4891 Unspecified atrial fibrillation: Secondary | ICD-10-CM | POA: Diagnosis not present

## 2019-07-13 LAB — POCT INR: INR: 2.7 (ref 2.0–3.0)

## 2019-07-13 NOTE — Patient Instructions (Signed)
Description   Continue taking 1.5 tablets daily except 2 tablets on Tuesdays and Thursdays.  Recheck in 4 weeks. [facility] [phone].

## 2019-07-13 NOTE — Progress Notes (Signed)
Cardiology Office Note:    Date:  07/13/2019   ID:  Cynthia Vargas, DOB 1932/06/12, MRN [id number]  PCP:  Hoyt Koch, MD  Cardiologist:  Fransico Him, MD    Referring MD: Hoyt Koch, *   Chief Complaint  Patient presents with  . Coronary Artery Disease  . Hypertension  . Aortic Insuffiency  . Hyperlipidemia  . Atrial Fibrillation    History of Present Illness:    Cynthia Vargas is a 86 y.o. female with a hx of HTN, nonobstructive CAD (Coronary CTA showed calcium score of 127 with mild CAD in the ostial left main and moderate CAD in the proximal LAD.  FFR was negative for any significant stenosis).  She also has stage IIB carcinoma of the lung,mild AI and hx of moderatepericardial effusion with acute pericarditis treated with NSAIDs and colchicine.  She was treated for 3 months on colchicine and effusion subsequently resolved.  Her last 2D echo in 2019 showed normal LVF with mild AI and mild pulmonary HTN with PASO 19mmHg. Patient had new onset atrial fibrillation with RVR 03/04/2018.  Amlodipine was stopped and she was started on Cardizem for rate control as well as Eliquis 2.5 mg twice daily because of age and weight.  Follow-up office visit with Ellen Henri, PA-C 03/30/2018 patient was back in normal sinus rhythm.  She is here today for followup and is doing well.  She denies any chest pain or pressure, SOB, DOE, PND, orthopnea, LE edema, dizziness, palpitations or syncope. She is compliant with her meds and is tolerating meds with no SE.    Past Medical History:  Diagnosis Date  . Acute pericarditis 11/01/2015   a. moderate pericardial effusion by echo 09/2015 - signficantly improved with trivial effusion on repeat echo after NSAIDS and colchicine 10/2015.  [name] Aortic insufficiency   . CAD (coronary artery disease), native coronary artery    0-25% ostial LM and 40-69% pLAD with normal FFR by coronary CTA 12/2017  . CALCANEAL FRACTURE, RIGHT 11/04/2007    . Essential hypertension   . GERD (gastroesophageal reflux disease)   . Hyperlipidemia LDL goal <70 02/08/2016  . KNEE PAIN, RIGHT, CHRONIC 03/19/2010  . Lung nodule    a. found on CT 2017 - possible stage IIB carcinoma.  . Mitral regurgitation   . Nonobstructive atherosclerosis of coronary artery   . OSTEOPOROSIS 05/02/2007  . PEPTIC ULCER DISEASE 05/02/2007    Past Surgical History:  Procedure Laterality Date  . Calcaneal fracture, right    . CHOLECYSTECTOMY    . DILATION AND CURETTAGE OF UTERUS    . ESOPHAGOGASTRODUODENOSCOPY N/A 03/30/2013   Procedure: ESOPHAGOGASTRODUODENOSCOPY (EGD);  Surgeon: Jerene Bears, MD;  Location: [facility];  Service: Endoscopy;  Laterality: N/A;  . FUDUCIAL PLACEMENT N/A 09/29/2015   Procedure:  PLACEMENT OF FUDUCIAL Marker times three;  Surgeon: Grace Isaac, MD;  Location: [facility];  Service: Thoracic;  Laterality: N/A;  . LUNG BIOPSY N/A 09/29/2015   Procedure: LUNG BIOPSY;  Surgeon: Grace Isaac, MD;  Location: [facility];  Service: Thoracic;  Laterality: N/A;  . ORIF HIP FRACTURE    . TONSILLECTOMY    . VIDEO BRONCHOSCOPY WITH ENDOBRONCHIAL NAVIGATION N/A 09/29/2015   Procedure: VIDEO BRONCHOSCOPY WITH ENDOBRONCHIAL NAVIGATION;  Surgeon: Grace Isaac, MD;  Location: [facility];  Service: Thoracic;  Laterality: N/A;    Current Medications: Current Meds  Medication Sig  . atorvastatin (LIPITOR) 20 MG tablet Take 1 tablet (20 mg total) by  mouth daily at 6 PM. Please make appt for future refills.  [name] diltiazem (CARDIZEM CD) 120 MG 24 hr capsule Take 1 capsule (120 mg total) by mouth daily.  . Multiple Vitamin (MULTIVITAMIN) tablet Take 1 tablet by mouth daily.  . Multiple Vitamins-Minerals (MULTIVITAMIN PO) Take 1 tablet by mouth.  . nitroGLYCERIN (NITROSTAT) 0.4 MG SL tablet Place 0.4 mg under the tongue every 5 (five) minutes as needed for chest pain.  [name] warfarin (COUMADIN) 5 MG tablet Take 1.5 tablets daily except 2 tablets on Tuesday and  Thursday or as directed by COUMADIN CLINIC     Allergies:   Peanut-containing drug products   Social History   Socioeconomic History  . Marital status: Single    Spouse name: Not on file  . Number of children: 1  . Years of education: 50  . Highest education level: Not on file  Occupational History  . Occupation: reitred  Tobacco Use  . Smoking status: Never Smoker  . Smokeless tobacco: Never Used  Substance and Sexual Activity  . Alcohol use: Yes    Alcohol/week: 1.0 standard drinks    Types: 1 Glasses of wine per week    Comment: very rarely on special occassionally  . Drug use: No  . Sexual activity: Never  Other Topics Concern  . Not on file  Social History Narrative   [employer] degrees in Development worker, community. married for 2 years then single . 1 son - Harvie Junior [place]. 2 daughters-put up for adoption. Lives alone and is independent in ADL's. works at [employer] until injured.   Social Determinants of Health   Financial Resource Strain:   . Difficulty of Paying Living Expenses: Not on file  Food Insecurity:   . Worried About Charity fundraiser in the Last Year: Not on file  . Ran Out of Food in the Last Year: Not on file  Transportation Needs:   . Lack of Transportation (Medical): Not on file  . Lack of Transportation (Non-Medical): Not on file  Physical Activity:   . Days of Exercise per Week: Not on file  . Minutes of Exercise per Session: Not on file  Stress:   . Feeling of Stress : Not on file  Social Connections:   . Frequency of Communication with Friends and Family: Not on file  . Frequency of Social Gatherings with Friends and Family: Not on file  . Attends Religious Services: Not on file  . Active Member of Clubs or Organizations: Not on file  . Attends Archivist Meetings: Not on file  . Marital Status: Not on file     Family History: The patient's family history includes Cancer in her father; Heart disease in her mother. There is no history of  Diabetes, Hyperlipidemia, or Hypertension.  ROS:   Please see the history of present illness.    ROS  All other systems reviewed and negative.   EKGs/Labs/Other Studies Reviewed:    The following studies were reviewed today: none  EKG:  EKG is  ordered today.  The ekg ordered today demonstrates NSR at 71bpm  Recent Labs: 05/25/2019: ALT 18; BUN 13; Creatinine, Ser 0.79; Hemoglobin 12.3; Platelets 172.0; Potassium 3.4; Sodium 139; TSH 4.93   Recent Lipid Panel    Component Value Date/Time   CHOL 156 05/25/2019 1553   CHOL 161 12/01/2017 1025   TRIG 20.0 05/25/2019 1553   HDL 88.10 05/25/2019 1553   HDL 95 12/01/2017 1025   CHOLHDL 2 05/25/2019 1553  VLDL 4.0 05/25/2019 1553   LDLCALC 64 05/25/2019 1553   LDLCALC 59 12/01/2017 1025   LDLDIRECT 115.1 03/19/2010 1406    Physical Exam:    VS:  BP 132/74   Pulse 71   Ht 5\' 2"  (1.575 m)   Wt 106 lb 9.6 oz (48.4 kg)   BMI 19.50 kg/m     Wt Readings from Last 3 Encounters:  07/13/19 106 lb 9.6 oz (48.4 kg)  05/25/19 106 lb (48.1 kg)  06/05/18 108 lb (49 kg)     GEN:  Well nourished, well developed in no acute distress HEENT: Normal NECK: No JVD; No carotid bruits LYMPHATICS: No lymphadenopathy CARDIAC: RRR, no murmurs, rubs, gallops RESPIRATORY:  Clear to auscultation without rales, wheezing or rhonchi  ABDOMEN: Soft, non-tender, non-distended MUSCULOSKELETAL:  No edema; No deformity  SKIN: Warm and dry NEUROLOGIC:  Alert and oriented x 3 PSYCHIATRIC:  Normal affect   ASSESSMENT:    1. Pericardial effusion   2. Essential hypertension   3. Coronary artery disease involving native coronary artery of native heart without angina pectoris   4. Nonrheumatic aortic valve insufficiency   5. Hyperlipidemia LDL goal <70   6. Pulmonary HTN ([place])   7. PAF (paroxysmal atrial fibrillation) (HCC)    PLAN:    In order of problems listed above:  1.  H/O pericardial effusion with Pericarditis -this has completely  resolved and she has not had any further pleuritic CP  2.  HTN -BP controlled -continue Cardizem CD 120mg  daily  3.  Nonobstructive CAD -denies any anginal sx and walks her dog daily.  -not ASA due to warfarin -continue statin  4.  Aortic insufficiency -2D echo 11/2017 showed mild AI  5.  HLD -LDL goal < 70 -LDL was 64 in Nov 2020 -continue atorvastatin 20mg  daily  6. Pulmonary HTN -mild by echo 2019 with PASP 46mmHg -repeat echo to make sure this is stable  7.  PAF -maintaining NSR -denies any palpitations -continue Eliquis 2.5mg  BID (age>80 and weight < 60kg) -continue Cardizem CD 120mg  daily -creatinine was 0.790 and Hbg 12.3 in Nov 2020  Medication Adjustments/Labs and Tests Ordered: Current medicines are reviewed at length with the patient today.  Concerns regarding medicines are outlined above.  No orders of the defined types were placed in this encounter.  No orders of the defined types were placed in this encounter.   Signed, Fransico Him, MD  07/13/2019 2:41 PM    [place]

## 2019-07-13 NOTE — Patient Instructions (Signed)

## 2019-07-15 NOTE — Addendum Note (Signed)
Addended by: Patterson Hammersmith A on: 07/15/2019 03:02 PM   Modules accepted: Orders

## 2019-07-23 ENCOUNTER — Ambulatory Visit (HOSPITAL_COMMUNITY): Payer: Medicare Other | Attending: Cardiology

## 2019-07-23 ENCOUNTER — Other Ambulatory Visit: Payer: Self-pay

## 2019-07-23 DIAGNOSIS — I272 Pulmonary hypertension, unspecified: Secondary | ICD-10-CM

## 2019-08-10 ENCOUNTER — Other Ambulatory Visit: Payer: Self-pay

## 2019-08-10 ENCOUNTER — Ambulatory Visit (INDEPENDENT_AMBULATORY_CARE_PROVIDER_SITE_OTHER): Payer: Medicare Other | Admitting: *Deleted

## 2019-08-10 DIAGNOSIS — I4891 Unspecified atrial fibrillation: Secondary | ICD-10-CM | POA: Diagnosis not present

## 2019-08-10 DIAGNOSIS — Z7901 Long term (current) use of anticoagulants: Secondary | ICD-10-CM

## 2019-08-10 DIAGNOSIS — Z5181 Encounter for therapeutic drug level monitoring: Secondary | ICD-10-CM

## 2019-08-10 LAB — POCT INR: INR: 2.5 (ref 2.0–3.0)

## 2019-08-10 NOTE — Patient Instructions (Signed)
Description   Continue taking 1.5 tablets daily except 2 tablets on Tuesdays and Thursdays.  Recheck in 5 weeks. [facility]in Clinic [phone].

## 2019-08-13 ENCOUNTER — Other Ambulatory Visit: Payer: Self-pay | Admitting: *Deleted

## 2019-08-13 MED ORDER — WARFARIN SODIUM 5 MG PO TABS: ORAL_TABLET | ORAL | 3 refills | Status: DC

## 2019-08-18 ENCOUNTER — Other Ambulatory Visit: Payer: Self-pay | Admitting: Cardiology

## 2019-08-18 DIAGNOSIS — Z0000 Encounter for general adult medical examination without abnormal findings: Secondary | ICD-10-CM

## 2019-08-18 DIAGNOSIS — I10 Essential (primary) hypertension: Secondary | ICD-10-CM

## 2019-08-18 MED ORDER — DILTIAZEM HCL ER COATED BEADS 120 MG PO CP24: 120.0000 mg | ORAL_CAPSULE | Freq: Every day | ORAL | 3 refills | Status: DC

## 2019-08-18 MED ORDER — ATORVASTATIN CALCIUM 20 MG PO TABS: 20.0000 mg | ORAL_TABLET | Freq: Every day | ORAL | 2 refills | Status: AC

## 2019-08-18 NOTE — Addendum Note (Signed)
Addended by: Derl Barrow on: 08/18/2019 12:50 PM   Modules accepted: Orders

## 2019-08-29 ENCOUNTER — Ambulatory Visit: Payer: Medicare Other | Attending: Internal Medicine

## 2019-08-29 DIAGNOSIS — Z23 Encounter for immunization: Secondary | ICD-10-CM | POA: Insufficient documentation

## 2019-08-29 NOTE — Progress Notes (Signed)
   Covid-19 Vaccination Clinic  Name:  Cynthia Vargas    MRN: [id number] DOB: [birth year]  08/29/2019  [name] was observed post Covid-19 immunization for 15 minutes without incidence. She was provided with Vaccine Information Sheet and instruction to access the V-Safe system.   [name] was instructed to call 911 with any severe reactions post vaccine: [name] Difficulty breathing  . Swelling of your face and throat  . A fast heartbeat  . A bad rash all over your body  . Dizziness and weakness    Immunizations Administered    Name Date Dose VIS Date Route   Pfizer COVID-19 Vaccine 08/29/2019  2:42 PM 0.3 mL 06/11/2019 Intramuscular   Manufacturer: [place]   Lot: [id number]   [place]: [address]-2

## 2019-09-14 ENCOUNTER — Other Ambulatory Visit: Payer: Self-pay

## 2019-09-14 ENCOUNTER — Ambulatory Visit (INDEPENDENT_AMBULATORY_CARE_PROVIDER_SITE_OTHER): Payer: Medicare Other | Admitting: *Deleted

## 2019-09-14 DIAGNOSIS — I4891 Unspecified atrial fibrillation: Secondary | ICD-10-CM

## 2019-09-14 DIAGNOSIS — Z7901 Long term (current) use of anticoagulants: Secondary | ICD-10-CM | POA: Diagnosis not present

## 2019-09-14 LAB — POCT INR: INR: 2.8 (ref 2.0–3.0)

## 2019-09-14 NOTE — Patient Instructions (Signed)
Description   Continue taking 1.5 tablets daily except 2 tablets on Tuesdays and Thursdays.  Recheck in 6 weeks. Coumadin Clinic [phone].

## 2019-09-28 ENCOUNTER — Ambulatory Visit: Payer: Medicare Other | Attending: Internal Medicine

## 2019-09-28 DIAGNOSIS — Z23 Encounter for immunization: Secondary | ICD-10-CM

## 2019-09-28 NOTE — Progress Notes (Signed)
   Covid-19 Vaccination Clinic  Name:  Cynthia Vargas    MRN: [id number] DOB: 06-26-1932  09/28/2019  [name] was observed post Covid-19 immunization for 15 minutes without incident. She was provided with Vaccine Information Sheet and instruction to access the V-Safe system.   [name] was instructed to call 911 with any severe reactions post vaccine: [name] Difficulty breathing  . Swelling of face and throat  . A fast heartbeat  . A bad rash all over body  . Dizziness and weakness   Immunizations Administered    Name Date Dose VIS Date Route   Pfizer COVID-19 Vaccine 09/28/2019  2:59 PM 0.3 mL 06/11/2019 Intramuscular   Manufacturer: [place]   Lot: [id number]   [place]: [address]-3

## 2019-10-26 ENCOUNTER — Ambulatory Visit (INDEPENDENT_AMBULATORY_CARE_PROVIDER_SITE_OTHER): Payer: Medicare Other | Admitting: *Deleted

## 2019-10-26 ENCOUNTER — Other Ambulatory Visit: Payer: Self-pay

## 2019-10-26 DIAGNOSIS — Z7901 Long term (current) use of anticoagulants: Secondary | ICD-10-CM

## 2019-10-26 DIAGNOSIS — I4891 Unspecified atrial fibrillation: Secondary | ICD-10-CM | POA: Diagnosis not present

## 2019-10-26 LAB — POCT INR: INR: 3.3 — AB (ref 2.0–3.0)

## 2019-10-26 NOTE — Patient Instructions (Addendum)
Description   Do not take any Warfarin today then continue taking 1.5 tablets daily except 2 tablets on Tuesdays and Thursdays.  Recheck in 4 weeks. Coumadin Clinic [phone].

## 2019-11-16 ENCOUNTER — Telehealth: Payer: Self-pay | Admitting: Internal Medicine

## 2019-11-16 DIAGNOSIS — I4891 Unspecified atrial fibrillation: Secondary | ICD-10-CM

## 2019-11-16 NOTE — Progress Notes (Signed)
  Chronic Care Management   Note  11/16/2019 Name: ZNYA ALBINO MRN: [id number] DOB: Jun 20, 1932  AMANDALEE LACAP is a 86 y.o. year old female who is a primary care patient of Dannica, Bickham, MD. I reached out to [name] by phone today in response to a referral sent by Ms. Arletha Pili PCP, Hoyt Koch, MD.   Ms. Harold was given information about Chronic Care Management services today including:  1. CCM service includes personalized support from designated clinical staff supervised by her physician, including individualized plan of care and coordination with other care providers 2. 24/7 contact phone numbers for assistance for urgent and routine care needs. 3. Service will only be billed when office clinical staff spend 20 minutes or more in a month to coordinate care. 4. Only one practitioner may furnish and bill the service in a calendar month. 5. The patient may stop CCM services at any time (effective at the end of the month) by phone call to the office staff.   Patient agreed to services and verbal consent obtained.   This note is not being shared with the patient for the following reason: To respect privacy (The patient or proxy has requested that the information not be shared).  Follow up plan:   Earney [place] Upstream Scheduler

## 2019-11-23 ENCOUNTER — Other Ambulatory Visit: Payer: Self-pay

## 2019-11-23 ENCOUNTER — Ambulatory Visit (INDEPENDENT_AMBULATORY_CARE_PROVIDER_SITE_OTHER): Payer: Medicare Other | Admitting: Pharmacist

## 2019-11-23 DIAGNOSIS — I4891 Unspecified atrial fibrillation: Secondary | ICD-10-CM | POA: Diagnosis not present

## 2019-11-23 DIAGNOSIS — Z7901 Long term (current) use of anticoagulants: Secondary | ICD-10-CM

## 2019-11-23 LAB — POCT INR: INR: 4.2 — AB (ref 2.0–3.0)

## 2019-11-23 NOTE — Patient Instructions (Signed)
Description   Skip your Coumadin today and tomorrow, then start taking 1.5 tablets daily except 2 tablets on Tuesdays.  Recheck in 4 weeks. Coumadin Clinic [phone].

## 2019-12-07 ENCOUNTER — Telehealth: Payer: Self-pay | Admitting: Internal Medicine

## 2019-12-07 NOTE — Progress Notes (Signed)
  Chronic Care Management   Outreach Note  12/07/2019 Name: Cynthia Vargas MRN: [id number] DOB: 06/06/1932  Referred by: Hoyt Koch, MD Reason for referral : No chief complaint on file.   An unsuccessful telephone outreach was attempted today. The patient was referred to the pharmacist for assistance with care management and care coordination.   This note is not being shared with the patient for the following reason: To respect privacy (The patient or proxy has requested that the information not be shared).  Follow Up Plan:   Earney [place] Upstream Scheduler

## 2019-12-14 ENCOUNTER — Other Ambulatory Visit: Payer: Self-pay

## 2019-12-14 ENCOUNTER — Ambulatory Visit (INDEPENDENT_AMBULATORY_CARE_PROVIDER_SITE_OTHER): Payer: Medicare Other | Admitting: *Deleted

## 2019-12-14 DIAGNOSIS — I4891 Unspecified atrial fibrillation: Secondary | ICD-10-CM | POA: Diagnosis not present

## 2019-12-14 DIAGNOSIS — Z7901 Long term (current) use of anticoagulants: Secondary | ICD-10-CM | POA: Diagnosis not present

## 2019-12-14 LAB — POCT INR: INR: 2.1 (ref 2.0–3.0)

## 2019-12-14 NOTE — Patient Instructions (Signed)
Description   Continue taking Warfarin 1.5 tablets daily except 2 tablets on Tuesdays.  Recheck in 4 weeks. Coumadin Clinic [phone].

## 2019-12-23 ENCOUNTER — Telehealth: Payer: Medicare Other

## 2019-12-23 ENCOUNTER — Telehealth: Payer: Self-pay | Admitting: Internal Medicine

## 2019-12-23 NOTE — Addendum Note (Signed)
Addended by: Karle Barr on: 12/23/2019 04:31 PM   Modules accepted: Orders

## 2019-12-23 NOTE — Progress Notes (Signed)
  Chronic Care Management   Outreach Note  12/23/2019 Name: Cynthia Vargas MRN: [id number] DOB: [birth year]  Referred by: Hoyt Koch, MD Reason for referral : No chief complaint on file.   An unsuccessful telephone outreach was attempted today. The patient was referred to the pharmacist for assistance with care management and care coordination. This note is not being shared with the patient for the following reason: To respect privacy (The patient or proxy has requested that the information not be shared).  Follow Up Plan:   Earney Hamburg Upstream Scheduler

## 2020-01-06 ENCOUNTER — Other Ambulatory Visit: Payer: Self-pay | Admitting: Cardiology

## 2020-01-11 ENCOUNTER — Ambulatory Visit (INDEPENDENT_AMBULATORY_CARE_PROVIDER_SITE_OTHER): Payer: Medicare Other

## 2020-01-11 ENCOUNTER — Other Ambulatory Visit: Payer: Self-pay

## 2020-01-11 DIAGNOSIS — I4891 Unspecified atrial fibrillation: Secondary | ICD-10-CM

## 2020-01-11 DIAGNOSIS — Z7901 Long term (current) use of anticoagulants: Secondary | ICD-10-CM | POA: Diagnosis not present

## 2020-01-11 LAB — POCT INR: INR: 3.2 — AB (ref 2.0–3.0)

## 2020-01-11 NOTE — Patient Instructions (Signed)
Description   Skip today's dosage Warfarin, then resume same dosage 1.5 tablets daily except 2 tablets on Tuesdays.  Recheck in 4 weeks. Coumadin Clinic [phone].

## 2020-01-21 NOTE — Chronic Care Management (AMB) (Signed)
Chronic Care Management Pharmacy  Name: Cynthia Vargas  MRN: [id number] DOB: Dec 08, 1931   Chief Complaint/ HPI  Cynthia Vargas,  86 y.o. , female presents for their Initial CCM visit with the clinical pharmacist via telephone due to COVID-19 Pandemic.  PCP : Hoyt Koch, MD Patient Care Team: Hoyt Koch, MD as PCP - General (Internal Medicine) Sueanne Margarita, MD as PCP - Cardiology (Cardiology) Sueanne Margarita, MD as Consulting Physician (Cardiology) Charlton Haws, [facility] as Pharmacist (Pharmacist)  Their chronic conditions include: Hypertension, Hyperlipidemia, Atrial Fibrillation, Coronary Artery Disease, GERD, Osteoporosis and Allergic Rhinitis, Urinary incontinence  Office Visits: 05/25/19 Dr Sharlet Salina OV: no med changes. Still taking Fosamax.  Consult Visit: 07/13/19 Dr Radford Pax (cardiology): f/u nonobstructive CAD, hx pericardial effusion, new onset Afib 03/2018. LDL goal < 70. No med changes.  From [place], went to school in [place] but moved back here. She has 1 son who owns a hotel in [place]. She lives with 2 pets, a cat and a dog. She walks her dog every day.   Allergies  Allergen Reactions  . Peanut-Containing Drug Products Other (See Comments)    angioedema    Medications: Outpatient Encounter Medications as of 01/24/2020  Medication Sig  . alendronate (FOSAMAX) 70 MG tablet Take 70 mg by mouth once a week. Take with a full glass of water on an empty stomach.  [name] atorvastatin (LIPITOR) 20 MG tablet Take 1 tablet (20 mg total) by mouth daily at 6 PM.  . diltiazem (CARDIZEM CD) 120 MG 24 hr capsule Take 1 capsule (120 mg total) by mouth daily.  . Multiple Vitamin (MULTIVITAMIN) tablet Take 1 tablet by mouth daily.  . Multiple Vitamins-Minerals (MULTIVITAMIN PO) Take 1 tablet by mouth.  . nitroGLYCERIN (NITROSTAT) 0.4 MG SL tablet Place 0.4 mg under the tongue every 5 (five) minutes as needed for chest pain.  [name] warfarin (COUMADIN) 5 MG  tablet TAKE 1 AND 1/2 TABLET DAILY EXCEPT 2 TABLETS ON TUESDAY AND THURSDAY OR AS DIRECTED BY COUMADIN CLINIC   No facility-administered encounter medications on file as of 01/24/2020.     Current Diagnosis/Assessment:  SDOH Interventions     Most Recent Value  SDOH Interventions  Financial Strain Interventions Intervention Not Indicated      Goals Addressed            This Visit's Progress   . Pharmacy Care Plan       CARE PLAN ENTRY (see longitudinal plan of care for additional care plan information)  Current Barriers:  . Chronic Disease Management support, education, and care coordination needs related to Hypertension, Hyperlipidemia, Atrial Fibrillation, Coronary Artery Disease, Osteoporosis, and Urinary incontinence   Hypertension BP Readings from Last 3 Encounters:  07/13/19 132/74  05/25/19 120/70  06/05/18 122/82 .  Pharmacist Clinical Goal(s): o Over the next 150 days, patient will work with PharmD and providers to maintain BP goal <140/90 . Current regimen:  o Diltiazem ER 120 mg daily . Interventions: o Discussed BP goals and benefits of medication for prevention of heart attack / stroke . Patient self care activities - Over the next 150 days, patient will: o Check BP as needed, document, and provide at future appointments o Ensure daily salt intake < 2300 mg/day  Hyperlipidemia / Coronary artery disease Lab Results  Component Value Date/Time   LDLCALC 64 05/25/2019 03:53 PM   [place] 59 12/01/2017 10:25 AM   LDLDIRECT 115.1 03/19/2010 02:06 PM .  Pharmacist Clinical Goal(s): o  Over the next 150 days, patient will work with PharmD and providers to maintain LDL goal < 70 . Current regimen:  o Atorvastatin 20 mg daily o Nitroglycerin 0.4 mg as needed for chest pain . Interventions: o Discussed cholesterol goals and benefits of medication for prevention of heart attack / stroke . Patient self care activities - Over the next 150 days, patient  will: o Continue current medication  Atrial Fibrillation . Pharmacist Clinical Goal(s) o Over the next 150 days, patient will work with PharmD and providers to optimize therapy . Current regimen:  o Diltiazem ER 120 mg daily o Warfarin  5 mg as directed . Interventions: o Discussed alternatives to warfarin - Eliquis, Xarelto.  o Per [employer] formulary Xarelto is a preferred product, $35-47 per month depending on specific plan . Patient self care activities - Over the next 150 days, patient will: o Discuss changing warfarin with PCP if desired  Osteoporosis . Pharmacist Clinical Goal(s) o Over the next 120 days, patient will work with PharmD and providers to optimize therapy . Current regimen:  o Alendronate 70 mg per week . Interventions: o Recommended to use pill box to help remembering once weekly dose . Patient self care activities - Over the next 150 days, patient will: o Organize medications in a pill box to make once-weekly dosing easier to remember  Urinary incontinence . Pharmacist Clinical Goal(s) o Over the next 30 days, patient will work with PharmD and providers to optimize therapy . Current regimen:  o No medications (tried and failed oxybutynin, Myrbetriq) . Interventions: o Recommend trospium 20 mg twice daily ($35-47 per month depending on specific plan) . Patient self care activities - Over the next 30 days, patient will: o Begin new medication as directed by PCP  Medication management . Pharmacist Clinical Goal(s): o Over the next 150 days, patient will work with PharmD and providers to maintain optimal medication adherence . Current pharmacy: [facility] . Interventions o Comprehensive medication review performed. o Continue current medication management strategy . Patient self care activities - Over the next 150 days, patient will: o Focus on medication adherence by fill date o Take medications as prescribed o Report any questions or concerns to  PharmD and/or provider(s)  Initial goal documentation      Hypertension   BP goal is:  <140/90  Office blood pressures are  BP Readings from Last 3 Encounters:  07/13/19 132/74  05/25/19 120/70  06/05/18 122/82   Kidney Function Lab Results  Component Value Date/Time   CREATININE 0.79 05/25/2019 03:53 PM   CREATININE 0.92 06/02/2018 01:37 PM   CREATININE 0.95 (H) 06/25/2016 02:24 PM   CREATININE 0.84 08/28/2015 03:19 PM   GFR 69.11 05/25/2019 03:53 PM   GFRNONAA 57 (L) 06/02/2018 01:37 PM   GFRAA 66 06/02/2018 01:37 PM   K 3.4 (L) 05/25/2019 03:53 PM   K 4.6 06/02/2018 01:37 PM   Patient checks BP at home infrequently Patient home BP readings are ranging: n/a  Patient has failed these meds in the past: n/a Patient is currently controlled on the following medications:  . Diltiazem ER 120 mg daily  We discussed diet and exercise extensively  Plan  Continue current medications and control with diet and exercise   Hyperlipidemia / CAD   LDL goal < 70  Lipid Panel     Component Value Date/Time   CHOL 156 05/25/2019 1553   CHOL 161 12/01/2017 1025   TRIG 20.0 05/25/2019 1553   HDL 88.10 05/25/2019  1553   HDL 95 12/01/2017 1025   LDLCALC 64 05/25/2019 1553   LDLCALC 59 12/01/2017 1025   LDLDIRECT 115.1 03/19/2010 1406    Hepatic Function Latest Ref Rng & Units 05/25/2019 12/01/2017 03/18/2017  Total Protein 6.0 - 8.3 g/dL 6.5 6.5 6.7  Albumin 3.5 - 5.2 g/dL 4.0 4.2 4.2  AST 0 - 37 U/L 28 34 22  ALT 0 - 35 U/L _0 Alk Phosphatase 39 - 117 U/L 41 42 50  Total Bilirubin 0.2 - 1.2 mg/dL 0.4 0.3 0.4  Bilirubin, Direct 0.00 - 0.40 mg/dL - 0.12 -    The ASCVD Risk score (Hollister., et al., 2013) failed to calculate for the following reasons:   The 2013 ASCVD risk score is only valid for ages 49 to 56   Patient has failed these meds in past: n/a Patient is currently controlled on the following medications:  . Atorvastatin 20 mg daily . Nitroglycerin 0.4  mg SL prn - took once 2 years ago  We discussed:  diet and exercise extensively; cholesterol goals; benefits of statin for ASCVD risk reduction  Plan  Continue current medications and control with diet and exercise  AFIB   Patient is currently rate controlled. Office heart rates are  Pulse Readings from Last 3 Encounters:  07/13/19 71  05/25/19 69  06/05/18 66   This patients CHA2DS2-VASc Score and unadjusted Ischemic Stroke Rate (% per year) is equal to 7.2 % stroke rate/year from a score of 5  Above score calculated as 1 point each if present [CHF, HTN, DM, Vascular=MI/PAD/Aortic Plaque, Age if 65-74, or Female] Above score calculated as 2 points each if present [Age > 75, or Stroke/TIA/TE]  Lab Results  Component Value Date/Time   INR 3.2 (A) 01/11/2020 03:44 PM   INR 2.1 12/14/2019 03:34 PM   INR 1.08 09/28/2015 09:32 AM   INR 1.0 03/17/2007 09:40 PM   Patient has failed these meds in past: n/a Patient is currently controlled on the following medications:  . Diltiazem ER 120 mg daily . Warfarin  5 mg  We discussed:  Pt is interested in alternatives to warfarin - INR historically fluctuates and she would like to avoid coming in so often for INR checks.   Per formulary review -  Xarelto is preferred with all [employer] plans; due to patient's age Eliquis is a safer option with lower risk of bleeding. Pt declined appt with PCP to discuss.  Plan  Continue current medications  Osteoporosis   Last DEXA Scan: 04/06/2018   T-Score femoral neck: -3.1  T-Score total hip: n/a  T-Score lumbar spine: -3.4  T-Score forearm radius: n/a  10-year probability of major osteoporotic fracture: n/a  10-year probability of hip fracture: n/a  VITD  Date Value Ref Range Status  03/18/2017 39.29 30.00 - 100.00 ng/mL Final    Patient is a candidate for pharmacologic treatment due to T-Score < -2.5 in femoral neck and T-Score < -2.5 in lumbar spine  Patient has failed these meds in  past: alendronate (03/2018 - 07/2019) Patient is currently controlled on the following medications:  . Alendronate 70 mg daily   We discussed:  Recommend [id number] units of vitamin D daily. Recommend 1200 mg of calcium daily from dietary and supplemental sources.; pt has not been taking alendronate every week due to forgetfulness with once weekly dosing; discussed combining it with a weekly activity or using a pill box to help her remember  Plan  Continue  current medications  Urinary incontinence   Patient has failed these meds in past: Myrbetriq, oxybutynin Patient is currently uncontrolled on the following medications:  . No medications  We discussed:  Pt reports she is having issues getting to the bathroom on time and going through her incontinence pads quickly, she is interested in trying another medication to help.   Per [employer] trospium IR (not ER) is preferred, $34-47/month depending on specific plan. Once daily dosing is recommended as initial dose for patients > 75 years.  Plan  Recommend trospium 20 mg once daily (preferred per formulary)  Health Maintenance   Patient is currently controlled on the following medications:  [name] Multivitamin  . Vitamin B12 . Vitamin C 500 mg BID  We discussed:  Patient is satisfied with current OTC regimen and denies issues  Plan  Continue current medications  Medication Management   Pt uses [facility] for all medications Uses pill box? No - prefers bottles Pt endorses 100% compliance  We discussed: Pt reports she does not have copays for her generic rx's at [facility]. She is happy with her method of managing medications.  Plan  Continue current medication management strategy    Follow up: 1 month phone visit  Charlene Brooke, PharmD Clinical Pharmacist [facility] at [facility] [phone]

## 2020-01-24 ENCOUNTER — Other Ambulatory Visit: Payer: Self-pay

## 2020-01-24 ENCOUNTER — Ambulatory Visit: Payer: Medicare Other | Admitting: Pharmacist

## 2020-01-24 DIAGNOSIS — I10 Essential (primary) hypertension: Secondary | ICD-10-CM

## 2020-01-24 DIAGNOSIS — M810 Age-related osteoporosis without current pathological fracture: Secondary | ICD-10-CM

## 2020-01-24 DIAGNOSIS — N393 Stress incontinence (female) (male): Secondary | ICD-10-CM

## 2020-01-24 DIAGNOSIS — I4891 Unspecified atrial fibrillation: Secondary | ICD-10-CM

## 2020-01-24 DIAGNOSIS — E785 Hyperlipidemia, unspecified: Secondary | ICD-10-CM

## 2020-01-24 NOTE — Patient Instructions (Addendum)
Visit Information  Phone number for Pharmacist: [phone]  Thank you for meeting with me to discuss your medications! I look forward to working with you to achieve your health care goals. Below is a summary of what we talked about during the visit:  Goals Addressed            This Visit's Progress   . Pharmacy Care Plan       CARE PLAN ENTRY (see longitudinal plan of care for additional care plan information)  Current Barriers:  . Chronic Disease Management support, education, and care coordination needs related to Hypertension, Hyperlipidemia, Atrial Fibrillation, Coronary Artery Disease, Osteoporosis, and Urinary incontinence   Hypertension BP Readings from Last 3 Encounters:  07/13/19 132/74  05/25/19 120/70  06/05/18 122/82 .  Pharmacist Clinical Goal(s): o Over the next 150 days, patient will work with PharmD and providers to maintain BP goal <140/90 . Current regimen:  o Diltiazem ER 120 mg daily . Interventions: o Discussed BP goals and benefits of medication for prevention of heart attack / stroke . Patient self care activities - Over the next 150 days, patient will: o Check BP as needed, document, and provide at future appointments o Ensure daily salt intake < 2300 mg/day  Hyperlipidemia / Coronary artery disease Lab Results  Component Value Date/Time   LDLCALC 64 05/25/2019 03:53 PM   [place] 59 12/01/2017 10:25 AM   LDLDIRECT 115.1 03/19/2010 02:06 PM .  Pharmacist Clinical Goal(s): o Over the next 150 days, patient will work with PharmD and providers to maintain LDL goal < 70 . Current regimen:  o Atorvastatin 20 mg daily o Nitroglycerin 0.4 mg as needed for chest pain . Interventions: o Discussed cholesterol goals and benefits of medication for prevention of heart attack / stroke . Patient self care activities - Over the next 150 days, patient will: o Continue current medication  Atrial Fibrillation . Pharmacist Clinical Goal(s) o Over the next 150  days, patient will work with PharmD and providers to optimize therapy . Current regimen:  o Diltiazem ER 120 mg daily o Warfarin  5 mg as directed . Interventions: o Discussed alternatives to warfarin - Eliquis, Xarelto.  o Per [employer] formulary Xarelto is a preferred product, $35-47 per month depending on specific plan . Patient self care activities - Over the next 150 days, patient will: o Discuss changing warfarin with PCP if desired  Osteoporosis . Pharmacist Clinical Goal(s) o Over the next 120 days, patient will work with PharmD and providers to optimize therapy . Current regimen:  o Alendronate 70 mg per week . Interventions: o Recommended to use pill box to help remembering once weekly dose . Patient self care activities - Over the next 150 days, patient will: o Organize medications in a pill box to make once-weekly dosing easier to remember  Urinary incontinence . Pharmacist Clinical Goal(s) o Over the next 30 days, patient will work with PharmD and providers to optimize therapy . Current regimen:  o No medications (tried and failed oxybutynin, Myrbetriq) . Interventions: o Recommend trospium 20 mg twice daily ($35-47 per month depending on specific plan) . Patient self care activities - Over the next 30 days, patient will: o Begin new medication as directed by PCP  Medication management . Pharmacist Clinical Goal(s): o Over the next 150 days, patient will work with PharmD and providers to maintain optimal medication adherence . Current pharmacy: [facility] . Interventions o Comprehensive medication review performed. o Continue current medication management strategy . Patient  self care activities - Over the next 150 days, patient will: o Focus on medication adherence by fill date o Take medications as prescribed o Report any questions or concerns to PharmD and/or provider(s)  Initial goal documentation       Cynthia Vargas was given information about Chronic  Care Management services today including:  1. CCM service includes personalized support from designated clinical staff supervised by her physician, including individualized plan of care and coordination with other care providers 2. 24/7 contact phone numbers for assistance for urgent and routine care needs. 3. Standard insurance, coinsurance, copays and deductibles apply for chronic care management only during months in which we provide at least 20 minutes of these services. Most insurances cover these services at 100%, however patients may be responsible for any copay, coinsurance and/or deductible if applicable. This service may help you avoid the need for more expensive face-to-face services. 4. Only one practitioner may furnish and bill the service in a calendar month. 5. The patient may stop CCM services at any time (effective at the end of the month) by phone call to the office staff.  Patient agreed to services and verbal consent obtained.   The patient verbalized understanding of instructions provided today and agreed to receive a mailed copy of patient instruction and/or educational materials. Telephone follow up appointment with pharmacy team member scheduled for: 5 month  Cynthia Vargas, PharmD Clinical Pharmacist [facility] at [facility] [phone]  Urinary Incontinence  Urinary incontinence refers to a condition in which a person is unable to control where and when to pass urine. A person with this condition will urinate when he or she does not mean to (involuntarily). What are the causes? This condition may be caused by:  Medicines.  Infections.  Constipation.  Overactive bladder muscles.  Weak bladder muscles.  Weak pelvic floor muscles. These muscles provide support for the bladder, intestine, and, in women, the uterus.  Enlarged prostate in men. The prostate is a gland near the bladder. When it gets too big, it can pinch the urethra. With the  urethra blocked, the bladder can weaken and lose the ability to empty properly.  Surgery.  Emotional factors, such as anxiety, stress, or post-traumatic stress disorder (PTSD).  Pelvic organ prolapse. This happens in women when organs shift out of place and into the vagina. This shift can prevent the bladder and urethra from working properly. What increases the risk? The following factors may make you more likely to develop this condition:  Older age.  Obesity and physical inactivity.  Pregnancy and childbirth.  Menopause.  Diseases that affect the nerves or spinal cord (neurological diseases).  Long-term (chronic) coughing. This can increase pressure on the bladder and pelvic floor muscles. What are the signs or symptoms? Symptoms may vary depending on the type of urinary incontinence you have. They include:  A sudden urge to urinate, but passing urine involuntarily before you can get to a bathroom (urge incontinence).  Suddenly passing urine with any activity that forces urine to pass, such as coughing, laughing, exercise, or sneezing (stress incontinence).  Needing to urinate often, but urinating only a small amount, or constantly dribbling urine (overflow incontinence).  Urinating because you cannot get to the bathroom in time due to a physical disability, such as arthritis or injury, or communication and thinking problems, such as Alzheimer disease (functional incontinence). How is this diagnosed? This condition may be diagnosed based on:  Your medical history.  A physical exam.  Tests,  such as: ? Urine tests. ? X-rays of your kidney and bladder. ? Ultrasound. ? CT scan. ? Cystoscopy. In this procedure, a health care provider inserts a tube with a light and camera (cystoscope) through the urethra and into the bladder in order to check for problems. ? Urodynamic testing. These tests assess how well the bladder, urethra, and sphincter can store and release urine. There  are different types of urodynamic tests, and they vary depending on what the test is measuring. To help diagnose your condition, your health care provider may recommend that you keep a log of when you urinate and how much you urinate. How is this treated? Treatment for this condition depends on the type of incontinence that you have and its cause. Treatment may include:  Lifestyle changes, such as: ? Quitting smoking. ? Maintaining a healthy weight. ? Staying active. Try to get 150 minutes of moderate-intensity exercise every week. Ask your health care provider which activities are safe for you. ? Eating a healthy diet.  Avoid high-fat foods, like fried foods.  Avoid refined carbohydrates like white bread and white rice.  Limit how much alcohol and caffeine you drink.  Increase your fiber intake. Foods such as fresh fruits, vegetables, beans, and whole grains are healthy sources of fiber.  Pelvic floor muscle exercises.  Bladder training, such as lengthening the amount of time between bathroom breaks, or using the bathroom at regular intervals.  Using techniques to suppress bladder urges. This can include distraction techniques or controlled breathing exercises.  Medicines to relax the bladder muscles and prevent bladder spasms.  Medicines to help slow or prevent the growth of a man's prostate.  Botox injections. These can help relax the bladder muscles.  Using pulses of electricity to help change bladder reflexes (electrical nerve stimulation).  For women, using a medical device to prevent urine leaks. This is a small, tampon-like, disposable device that is inserted into the urethra.  Injecting collagen or carbon beads (bulking agents) into the urinary sphincter. These can help thicken tissue and close the bladder opening.  Surgery. Follow these instructions at home: Lifestyle  Limit alcohol and caffeine. These can fill your bladder quickly and irritate it.  Keep yourself  clean to help prevent odors and skin damage. Ask your doctor about special skin creams and cleansers that can protect the skin from urine.  Consider wearing pads or adult diapers. Make sure to change them regularly, and always change them right after experiencing incontinence. General instructions  Take over-the-counter and prescription medicines only as told by your health care provider.  Use the bathroom about every 3-4 hours, even if you do not feel the need to urinate. Try to empty your bladder completely every time. After urinating, wait a minute. Then try to urinate again.  Make sure you are in a relaxed position while urinating.  If your incontinence is caused by nerve problems, keep a log of the medicines you take and the times you go to the bathroom.  Keep all follow-up visits as told by your health care provider. This is important. Contact a health care provider if:  You have pain that gets worse.  Your incontinence gets worse. Get help right away if:  You have a fever or chills.  You are unable to urinate.  You have redness in your groin area or down your legs. Summary  Urinary incontinence refers to a condition in which a person is unable to control where and when to pass urine.  This  condition may be caused by medicines, infection, weak bladder muscles, weak pelvic floor muscles, enlargement of the prostate (in men), or surgery.  The following factors increase your risk for developing this condition: older age, obesity, pregnancy and childbirth, menopause, neurological diseases, and chronic coughing.  There are several types of urinary incontinence. They include urge incontinence, stress incontinence, overflow incontinence, and functional incontinence.  This condition is usually treated first with lifestyle and behavioral changes, such as quitting smoking, eating a healthier diet, and doing regular pelvic floor exercises. Other treatment options include medicines,  bulking agents, medical devices, electrical nerve stimulation, or surgery. This information is not intended to replace advice given to you by your health care provider. Make sure you discuss any questions you have with your health care provider. Document Revised: 06/27/2017 Document Reviewed: 09/26/2016 Elsevier Patient Education  [place].

## 2020-01-25 DIAGNOSIS — H2511 Age-related nuclear cataract, right eye: Secondary | ICD-10-CM | POA: Diagnosis not present

## 2020-01-25 DIAGNOSIS — H524 Presbyopia: Secondary | ICD-10-CM | POA: Diagnosis not present

## 2020-01-25 DIAGNOSIS — H26492 Other secondary cataract, left eye: Secondary | ICD-10-CM | POA: Diagnosis not present

## 2020-01-25 DIAGNOSIS — H43823 Vitreomacular adhesion, bilateral: Secondary | ICD-10-CM | POA: Diagnosis not present

## 2020-02-02 ENCOUNTER — Other Ambulatory Visit: Payer: Self-pay | Admitting: Internal Medicine

## 2020-02-08 ENCOUNTER — Other Ambulatory Visit: Payer: Self-pay

## 2020-02-08 ENCOUNTER — Ambulatory Visit (INDEPENDENT_AMBULATORY_CARE_PROVIDER_SITE_OTHER): Payer: Medicare Other | Admitting: *Deleted

## 2020-02-08 DIAGNOSIS — I4891 Unspecified atrial fibrillation: Secondary | ICD-10-CM | POA: Diagnosis not present

## 2020-02-08 DIAGNOSIS — Z7901 Long term (current) use of anticoagulants: Secondary | ICD-10-CM | POA: Diagnosis not present

## 2020-02-08 LAB — POCT INR: INR: 2.4 (ref 2.0–3.0)

## 2020-02-08 NOTE — Patient Instructions (Signed)
Description   Continue taking 1.5 tablets daily except 2 tablets on Tuesdays.  Recheck in 4 weeks. [facility] [phone].

## 2020-02-17 ENCOUNTER — Telehealth: Payer: Self-pay | Admitting: Cardiology

## 2020-02-17 DIAGNOSIS — H2511 Age-related nuclear cataract, right eye: Secondary | ICD-10-CM | POA: Diagnosis not present

## 2020-02-17 DIAGNOSIS — H25011 Cortical age-related cataract, right eye: Secondary | ICD-10-CM | POA: Diagnosis not present

## 2020-02-17 DIAGNOSIS — H25811 Combined forms of age-related cataract, right eye: Secondary | ICD-10-CM | POA: Diagnosis not present

## 2020-02-17 NOTE — Telephone Encounter (Signed)
STAT if HR is under 50 or over 120 (normal HR is 60-100 beats per minute)  1) What is your heart rate? 160  2) Do you have a log of your heart rate readings (document readings)? no  3) Do you have any other symptoms? Pt just got finished with cataract surgery

## 2020-02-17 NOTE — Telephone Encounter (Signed)
[facility] to obtain more information in regards to patient's heart rate after her surgery today.  They advised me to call [facility] at [phone].  I spoke with the Pre-Op nurse who states that when the patient came in her heart rate was ranging from 135-141 bpm. She states that they thought patient was in A Fib with RVR. The patient had not taken her medications that morning. They had patient take her PO dose of diltiazem prior to surgery. Patient was taken back for surgery and heart rate was still elevated so she was given IV labetalol. After surgery patient's heart rate was still elevated - she had not responded to diltiazem. They state that when the patient left she was asymptomatic and advised to follow up with cardiology.

## 2020-02-17 NOTE — Telephone Encounter (Signed)
The patient's friend, Cynthia Vargas, reported the patient had cataract surgery late this AM. After the procedure, Dr. Satira Sark instructed her to see her Cardiologist ASAP as her HR was in the 160s. She stated she is on the way to [facility] to be evaluated. Informed [facility] does not have a walk-in clinic and if she is symptomatic and her HR is that high the ER or Urgent Care is more appropriate. She was adamant about coming to the office saying "it would only take a minute."   Janan Halter, the clinic supervisor, spoke with Cynthia Vargas and reiterated to her that [facility] not a walk-in clinic. They spoke for several minutes about her medications and she is not sure what the patient took this morning. She thinks she took her Cardizem. It was agreed the patient would go to a [facility] for evaluation and told her [facility] would call this afternoon to check-in.  To Dr. Theodosia Blender nurse for follow-up. Will need to call Dr. Mellissa Kohut office [facility] phone: [phone]) to see what happened and then call the patient to check in. Cynthia Vargas requests to be called after the patient [phone]).

## 2020-02-17 NOTE — Telephone Encounter (Signed)
Spoke with the patient and Vaughan Basta. They just finished eating lunch and the patient is feeling much better. She states that she is not having any palpitations, SOB or dizziness. They have not gone to the [facility] like they were advised. I have recommended that they still go.  They are very frustrated that the patient was not able to be seen today. Vaughan Basta inquired on what she was supposed to do if this really was an emergent situation and the patient needed immediate attention. I advised her that she should take the patient to the ER in that case. Explained to them that were are a cardiology clinic that does not take walk-in patients. They verbalized understanding.  I have scheduled the patient to see Dr. Radford Pax on 09/03. They were very appreciative of my call and for making the appointment. Advised to call back if symptoms reoccur.

## 2020-03-02 NOTE — Progress Notes (Signed)
Cardiology Office Note:    Date:  03/02/2020   ID:  SHAMAINE MULKERN, DOB 1931-07-30, MRN [id number]  PCP:  Hoyt Koch, MD  Cardiologist:  Fransico Him, MD    Referring MD: Hoyt Koch, *   Chief Complaint  Patient presents with  . Follow-up    pericardial effusion, HTN, CAD, AI, HLD, pulmonary HTN, PAF    History of Present Illness:    Cynthia Vargas is a 86 y.o. female with a hx of HTN, nonobstructive CAD (Coronary CTA showed calcium score of 127 with mild CAD in the ostial left main and moderate CAD in the proximal LAD.  FFR was negative for any significant stenosis).  She also has stage IIB carcinoma of the lung,mild AI and hx of moderatepericardial effusion with acute pericarditis treated with NSAIDs and colchicine.  She was treated for 3 months on colchicine and effusion subsequently resolved.    Patient had new onset atrial fibrillation with RVR 03/04/2018.  Amlodipine was stopped and she was started on Cardizem for rate control as well as Eliquis 2.5 mg twice daily because of age and weight.  Follow-up office visit with Ellen Henri, PA-C 03/30/2018 patient was back in normal sinus rhythm.  Her last 2D echo in 07/2019 showed normal LVF with mild AI, no pulmonary HTN, severe LAE, severe LAE and small PE.   She is here today for followup and is doing well.  She denies any chest pain or pressure, SOB, DOE, PND, orthopnea, LE edema, dizziness, palpitations or syncope. She is compliant with her meds and is tolerating meds with no SE.    Past Medical History:  Diagnosis Date  . Acute pericarditis 11/01/2015   a. moderate pericardial effusion by echo 09/2015 - signficantly improved with trivial effusion on repeat echo after NSAIDS and colchicine 10/2015.  [name] Aortic insufficiency   . CAD (coronary artery disease), native coronary artery    0-25% ostial LM and 40-69% pLAD with normal FFR by coronary CTA 12/2017  . CALCANEAL FRACTURE, RIGHT 11/04/2007  . Essential  hypertension   . GERD (gastroesophageal reflux disease)   . Hyperlipidemia LDL goal <70 02/08/2016  . KNEE PAIN, RIGHT, CHRONIC 03/19/2010  . Lung nodule    a. found on CT 2017 - possible stage IIB carcinoma.  . Mitral regurgitation   . Nonobstructive atherosclerosis of coronary artery   . OSTEOPOROSIS 05/02/2007  . PEPTIC ULCER DISEASE 05/02/2007    Past Surgical History:  Procedure Laterality Date  . Calcaneal fracture, right    . CHOLECYSTECTOMY    . DILATION AND CURETTAGE OF UTERUS    . ESOPHAGOGASTRODUODENOSCOPY N/A 03/30/2013   Procedure: ESOPHAGOGASTRODUODENOSCOPY (EGD);  Surgeon: Jerene Bears, MD;  Location: [facility];  Service: Endoscopy;  Laterality: N/A;  . FUDUCIAL PLACEMENT N/A 09/29/2015   Procedure:  PLACEMENT OF FUDUCIAL Marker times three;  Surgeon: Grace Isaac, MD;  Location: [place];  Service: Thoracic;  Laterality: N/A;  . LUNG BIOPSY N/A 09/29/2015   Procedure: LUNG BIOPSY;  Surgeon: Grace Isaac, MD;  Location: [place];  Service: Thoracic;  Laterality: N/A;  . ORIF HIP FRACTURE    . TONSILLECTOMY    . VIDEO BRONCHOSCOPY WITH ENDOBRONCHIAL NAVIGATION N/A 09/29/2015   Procedure: VIDEO BRONCHOSCOPY WITH ENDOBRONCHIAL NAVIGATION;  Surgeon: Grace Isaac, MD;  Location: [place];  Service: Thoracic;  Laterality: N/A;    Current Medications: No outpatient medications have been marked as taking for the 03/03/20 encounter (Office Visit) with Fransico Him  R, MD.     Allergies:   Peanut-containing drug products   Social History   Socioeconomic History  . Marital status: Single    Spouse name: Not on file  . Number of children: 1  . Years of education: 34  . Highest education level: Not on file  Occupational History  . Occupation: reitred  Tobacco Use  . Smoking status: Never Smoker  . Smokeless tobacco: Never Used  Vaping Use  . Vaping Use: Never used  Substance and Sexual Activity  . Alcohol use: Yes    Alcohol/week: 1.0 standard drink    Types: 1  Glasses of wine per week    Comment: very rarely on special occassionally  . Drug use: No  . Sexual activity: Never  Other Topics Concern  . Not on file  Social History Narrative   [employer] degrees in Development worker, community. married for 2 years then single . 1 son - Harvie Junior [place]. 2 daughters-put up for adoption. Lives alone and is independent in ADL's. works at [employer] until injured.   Social Determinants of Health   Financial Resource Strain: Low Risk   . Difficulty of Paying Living Expenses: Not hard at all  Food Insecurity:   . Worried About Charity fundraiser in the Last Year: Not on file  . Ran Out of Food in the Last Year: Not on file  Transportation Needs:   . Lack of Transportation (Medical): Not on file  . Lack of Transportation (Non-Medical): Not on file  Physical Activity:   . Days of Exercise per Week: Not on file  . Minutes of Exercise per Session: Not on file  Stress:   . Feeling of Stress : Not on file  Social Connections:   . Frequency of Communication with Friends and Family: Not on file  . Frequency of Social Gatherings with Friends and Family: Not on file  . Attends Religious Services: Not on file  . Active Member of Clubs or Organizations: Not on file  . Attends Archivist Meetings: Not on file  . Marital Status: Not on file     Family History: The patient's family history includes Cancer in her father; Heart disease in her mother. There is no history of Diabetes, Hyperlipidemia, or Hypertension.  ROS:   Please see the history of present illness.    ROS  All other systems reviewed and negative.   EKGs/Labs/Other Studies Reviewed:    The following studies were reviewed today: none  EKG:  EKG is  ordered today.  The ekg ordered today demonstrates NSR at 71bpm  Recent Labs: 05/25/2019: ALT 18; BUN 13; Creatinine, Ser 0.79; Hemoglobin 12.3; Platelets 172.0; Potassium 3.4; Sodium 139; TSH 4.93   Recent Lipid Panel    Component Value Date/Time    CHOL 156 05/25/2019 1553   CHOL 161 12/01/2017 1025   TRIG 20.0 05/25/2019 1553   HDL 88.10 05/25/2019 1553   HDL 95 12/01/2017 1025   CHOLHDL 2 05/25/2019 1553   VLDL 4.0 05/25/2019 1553   LDLCALC 64 05/25/2019 1553   LDLCALC 59 12/01/2017 1025   LDLDIRECT 115.1 03/19/2010 1406    Physical Exam:    VS:  There were no vitals taken for this visit.    Wt Readings from Last 3 Encounters:  07/13/19 106 lb 9.6 oz (48.4 kg)  05/25/19 106 lb (48.1 kg)  06/05/18 108 lb (49 kg)     GEN: Well nourished, well developed in no acute distress HEENT: Normal NECK:  No JVD; No carotid bruits LYMPHATICS: No lymphadenopathy CARDIAC:RRR, no murmurs, rubs, gallops RESPIRATORY:  Clear to auscultation without rales, wheezing or rhonchi  ABDOMEN: Soft, non-tender, non-distended MUSCULOSKELETAL:  No edema; No deformity  SKIN: Warm and dry NEUROLOGIC:  Alert and oriented x 3 PSYCHIATRIC:  Normal affect    ASSESSMENT:    1. Pericardial effusion   2. Essential hypertension   3. Coronary artery disease involving native coronary artery of native heart without angina pectoris   4. Nonrheumatic aortic valve insufficiency   5. Hyperlipidemia LDL goal <70   6. Pulmonary HTN ([place])   7. PAF (paroxysmal atrial fibrillation) (HCC)    PLAN:    In order of problems listed above:  1.  H/O pericardial effusion with Pericarditis -2D echo 07/2019 showed mild pericardial effusion  2.  HTN -BP controlled on exam today -continue Cardizem CD 120mg  daily  3.  Nonobstructive CAD -she has not had any anginal sx -not ASA due to warfarin -continue statin  4.  Aortic insufficiency -2D echo 07/2019 showed mild AI  5.  HLD -LDL goal < 70 -LDL was 64 in Nov 2020 -continue atorvastatin 20mg  daily -check FLP and ALT  6. Pulmonary HTN -mild by echo 2019 with PASP 48mmHg but normalized on echo in 2021  7.  PAF -she is maintaining NSR -she denies any palpitations -continue Eliquis 2.5mg  BID (age>80 and  weight < 60kg) -continue Cardizem CD 120mg  daily -creatinine was 0.79 and Hbg 12.3 in Nov 2020 -check BMET and CBC  Medication Adjustments/Labs and Tests Ordered: Current medicines are reviewed at length with the patient today.  Concerns regarding medicines are outlined above.  No orders of the defined types were placed in this encounter.  No orders of the defined types were placed in this encounter.   Signed, Fransico Him, MD  03/02/2020 9:07 PM    [place] This encounter was created in error - please disregard.

## 2020-03-03 ENCOUNTER — Other Ambulatory Visit: Payer: Self-pay

## 2020-03-03 ENCOUNTER — Inpatient Hospital Stay (HOSPITAL_COMMUNITY)
Admission: EM | Admit: 2020-03-03 | Discharge: 2020-03-06 | DRG: 309 | Disposition: A | Discharge status: Home / Self Care | Payer: Medicare Other | Attending: Cardiology | Admitting: Cardiology

## 2020-03-03 ENCOUNTER — Ambulatory Visit: Payer: Medicare Other | Admitting: Cardiology

## 2020-03-03 ENCOUNTER — Emergency Department (HOSPITAL_COMMUNITY): Payer: Medicare Other

## 2020-03-03 ENCOUNTER — Encounter: Payer: Self-pay | Admitting: Nurse Practitioner

## 2020-03-03 ENCOUNTER — Encounter: Payer: Medicare Other | Admitting: Cardiology

## 2020-03-03 ENCOUNTER — Encounter (HOSPITAL_COMMUNITY): Payer: Self-pay | Admitting: *Deleted

## 2020-03-03 ENCOUNTER — Other Ambulatory Visit: Payer: Self-pay | Admitting: Nurse Practitioner

## 2020-03-03 DIAGNOSIS — R42 Dizziness and giddiness: Secondary | ICD-10-CM | POA: Diagnosis not present

## 2020-03-03 DIAGNOSIS — S199XXA Unspecified injury of neck, initial encounter: Secondary | ICD-10-CM | POA: Diagnosis not present

## 2020-03-03 DIAGNOSIS — Z91010 Allergy to peanuts: Secondary | ICD-10-CM

## 2020-03-03 DIAGNOSIS — Z79899 Other long term (current) drug therapy: Secondary | ICD-10-CM

## 2020-03-03 DIAGNOSIS — C3490 Malignant neoplasm of unspecified part of unspecified bronchus or lung: Secondary | ICD-10-CM | POA: Diagnosis present

## 2020-03-03 DIAGNOSIS — W19XXXA Unspecified fall, initial encounter: Secondary | ICD-10-CM | POA: Diagnosis present

## 2020-03-03 DIAGNOSIS — I313 Pericardial effusion (noninflammatory): Secondary | ICD-10-CM | POA: Diagnosis present

## 2020-03-03 DIAGNOSIS — K219 Gastro-esophageal reflux disease without esophagitis: Secondary | ICD-10-CM | POA: Diagnosis present

## 2020-03-03 DIAGNOSIS — R079 Chest pain, unspecified: Secondary | ICD-10-CM | POA: Diagnosis present

## 2020-03-03 DIAGNOSIS — I083 Combined rheumatic disorders of mitral, aortic and tricuspid valves: Secondary | ICD-10-CM | POA: Diagnosis present

## 2020-03-03 DIAGNOSIS — I480 Paroxysmal atrial fibrillation: Secondary | ICD-10-CM | POA: Diagnosis present

## 2020-03-03 DIAGNOSIS — Z7983 Long term (current) use of bisphosphonates: Secondary | ICD-10-CM

## 2020-03-03 DIAGNOSIS — Z20822 Contact with and (suspected) exposure to covid-19: Secondary | ICD-10-CM | POA: Diagnosis not present

## 2020-03-03 DIAGNOSIS — I4892 Unspecified atrial flutter: Secondary | ICD-10-CM | POA: Diagnosis not present

## 2020-03-03 DIAGNOSIS — Z7901 Long term (current) use of anticoagulants: Secondary | ICD-10-CM

## 2020-03-03 DIAGNOSIS — I10 Essential (primary) hypertension: Secondary | ICD-10-CM | POA: Diagnosis not present

## 2020-03-03 DIAGNOSIS — Z9049 Acquired absence of other specified parts of digestive tract: Secondary | ICD-10-CM

## 2020-03-03 DIAGNOSIS — Z8249 Family history of ischemic heart disease and other diseases of the circulatory system: Secondary | ICD-10-CM

## 2020-03-03 DIAGNOSIS — E785 Hyperlipidemia, unspecified: Secondary | ICD-10-CM | POA: Diagnosis present

## 2020-03-03 DIAGNOSIS — R55 Syncope and collapse: Secondary | ICD-10-CM | POA: Diagnosis not present

## 2020-03-03 DIAGNOSIS — I2510 Atherosclerotic heart disease of native coronary artery without angina pectoris: Secondary | ICD-10-CM | POA: Diagnosis present

## 2020-03-03 DIAGNOSIS — I4891 Unspecified atrial fibrillation: Secondary | ICD-10-CM | POA: Diagnosis not present

## 2020-03-03 DIAGNOSIS — I499 Cardiac arrhythmia, unspecified: Secondary | ICD-10-CM | POA: Diagnosis not present

## 2020-03-03 DIAGNOSIS — R4020 Unspecified coma: Secondary | ICD-10-CM | POA: Diagnosis not present

## 2020-03-03 DIAGNOSIS — M810 Age-related osteoporosis without current pathological fracture: Secondary | ICD-10-CM | POA: Diagnosis present

## 2020-03-03 DIAGNOSIS — S0990XA Unspecified injury of head, initial encounter: Secondary | ICD-10-CM | POA: Diagnosis not present

## 2020-03-03 DIAGNOSIS — R000 Tachycardia, unspecified: Secondary | ICD-10-CM | POA: Diagnosis not present

## 2020-03-03 DIAGNOSIS — Z8711 Personal history of peptic ulcer disease: Secondary | ICD-10-CM

## 2020-03-03 LAB — CBC
HCT: 42.4 % (ref 36.0–46.0)
Hemoglobin: 13.8 g/dL (ref 12.0–15.0)
MCH: 30.7 pg (ref 26.0–34.0)
MCHC: 32.5 g/dL (ref 30.0–36.0)
MCV: 94.2 fL (ref 80.0–100.0)
Platelets: 191 10*3/uL (ref 150–400)
RBC: 4.5 MIL/uL (ref 3.87–5.11)
RDW: 14.2 % (ref 11.5–15.5)
WBC: 5.7 10*3/uL (ref 4.0–10.5)
nRBC: 0 % (ref 0.0–0.2)

## 2020-03-03 LAB — SAMPLE TO BLOOD BANK

## 2020-03-03 LAB — PROTIME-INR
INR: 2.3 — ABNORMAL HIGH (ref 0.8–1.2)
INR: 2.1 — ABNORMAL HIGH (ref 0.8–1.2)
Prothrombin Time: 24.1 seconds — ABNORMAL HIGH (ref 11.4–15.2)
Prothrombin Time: 23 seconds — ABNORMAL HIGH (ref 11.4–15.2)

## 2020-03-03 LAB — URINALYSIS, ROUTINE W REFLEX MICROSCOPIC
Bilirubin Urine: NEGATIVE
Glucose, UA: NEGATIVE mg/dL
Hgb urine dipstick: NEGATIVE
Ketones, ur: NEGATIVE mg/dL
Leukocytes,Ua: NEGATIVE
Nitrite: NEGATIVE
Protein, ur: NEGATIVE mg/dL
Specific Gravity, Urine: 1.004 — ABNORMAL LOW (ref 1.005–1.030)
pH: 7 (ref 5.0–8.0)

## 2020-03-03 LAB — COMPREHENSIVE METABOLIC PANEL
ALT: 19 U/L (ref 0–44)
AST: 30 U/L (ref 15–41)
Albumin: 4.1 g/dL (ref 3.5–5.0)
Alkaline Phosphatase: 45 U/L (ref 38–126)
Anion gap: 11 (ref 5–15)
BUN: 14 mg/dL (ref 8–23)
CO2: 19 mmol/L — ABNORMAL LOW (ref 22–32)
Calcium: 8.8 mg/dL — ABNORMAL LOW (ref 8.9–10.3)
Chloride: 101 mmol/L (ref 98–111)
Creatinine, Ser: 0.93 mg/dL (ref 0.44–1.00)
GFR calc Af Amer: >60 mL/min (ref >60)
GFR calc non Af Amer: 56 mL/min — ABNORMAL LOW (ref >60)
Glucose, Bld: 101 mg/dL — ABNORMAL HIGH (ref 70–99)
Potassium: 4.7 mmol/L (ref 3.5–5.1)
Sodium: 131 mmol/L — ABNORMAL LOW (ref 135–145)
Total Bilirubin: 0.7 mg/dL (ref 0.3–1.2)
Total Protein: 6.7 g/dL (ref 6.5–8.1)

## 2020-03-03 LAB — MAGNESIUM: Magnesium: 2.1 mg/dL (ref 1.7–2.4)

## 2020-03-03 LAB — TROPONIN I (HIGH SENSITIVITY)
Troponin I (High Sensitivity): <2 ng/L (ref <18)
Troponin I (High Sensitivity): 4 ng/L (ref <18)
Troponin I (High Sensitivity): 4 ng/L (ref <18)

## 2020-03-03 LAB — BRAIN NATRIURETIC PEPTIDE: B Natriuretic Peptide: 479.8 pg/mL — ABNORMAL HIGH (ref 0.0–100.0)

## 2020-03-03 LAB — TSH: TSH: 7.271 u[IU]/mL — ABNORMAL HIGH (ref 0.350–4.500)

## 2020-03-03 MED ORDER — ACETAMINOPHEN 325 MG PO TABS: 650.0000 mg | ORAL_TABLET | ORAL | Status: DC | PRN

## 2020-03-03 MED ORDER — GATIFLOXACIN 0.5 % OP SOLN
1.0000 [drp] | Freq: Four times a day (QID) | OPHTHALMIC | Status: DC
  Administered 2020-03-04 – 2020-03-05 (×4): 1 [drp] via OPHTHALMIC
  Filled 2020-03-03: qty 2.5

## 2020-03-03 MED ORDER — DILTIAZEM HCL-DEXTROSE 125-5 MG/125ML-% IV SOLN (PREMIX)
5.0000 mg/h | INTRAVENOUS | Status: DC
  Administered 2020-03-03: 5 mg/h via INTRAVENOUS
  Administered 2020-03-04: 7.5 mg/h via INTRAVENOUS
  Filled 2020-03-03 (×2): qty 125

## 2020-03-03 MED ORDER — WARFARIN - PHARMACIST DOSING INPATIENT
Freq: Every day | Status: DC
  Administered 2020-03-04: 1

## 2020-03-03 MED ORDER — DILTIAZEM LOAD VIA INFUSION
10.0000 mg | Freq: Once | INTRAVENOUS | Status: AC
  Administered 2020-03-03: 10 mg via INTRAVENOUS
  Filled 2020-03-03: qty 10

## 2020-03-03 MED ORDER — KETOROLAC TROMETHAMINE 0.5 % OP SOLN
1.0000 [drp] | Freq: Four times a day (QID) | OPHTHALMIC | Status: DC
  Administered 2020-03-05 – 2020-03-06 (×5): 1 [drp] via OPHTHALMIC
  Filled 2020-03-03: qty 5

## 2020-03-03 MED ORDER — PREDNISOLONE ACETATE 1 % OP SUSP
1.0000 [drp] | Freq: Four times a day (QID) | OPHTHALMIC | Status: DC
  Administered 2020-03-04 – 2020-03-06 (×7): 1 [drp] via OPHTHALMIC
  Filled 2020-03-03: qty 5

## 2020-03-03 MED ORDER — ATORVASTATIN CALCIUM 10 MG PO TABS
20.0000 mg | ORAL_TABLET | Freq: Every day | ORAL | Status: DC
  Administered 2020-03-04 – 2020-03-06 (×3): 20 mg via ORAL
  Filled 2020-03-03 (×3): qty 2

## 2020-03-03 MED ORDER — ETOMIDATE 2 MG/ML IV SOLN
INTRAVENOUS | Status: AC | PRN
  Administered 2020-03-03: 5 mg via INTRAVENOUS

## 2020-03-03 MED ORDER — ETOMIDATE 2 MG/ML IV SOLN
INTRAVENOUS | Status: AC
  Administered 2020-03-03: 5 mg via INTRAVENOUS
  Filled 2020-03-03: qty 10

## 2020-03-03 MED ORDER — WARFARIN SODIUM 10 MG PO TABS: 10.0000 mg | ORAL_TABLET | ORAL | Status: DC

## 2020-03-03 MED ORDER — ONDANSETRON HCL 4 MG/2ML IJ SOLN: 4.0000 mg | Freq: Four times a day (QID) | INTRAMUSCULAR | Status: DC | PRN

## 2020-03-03 MED ORDER — WARFARIN SODIUM 7.5 MG PO TABS
7.5000 mg | ORAL_TABLET | ORAL | Status: DC
  Administered 2020-03-03 – 2020-03-05 (×3): 7.5 mg via ORAL
  Filled 2020-03-03 (×4): qty 1

## 2020-03-03 NOTE — Progress Notes (Signed)
Patient heart rate dropping below 60 nonsustained.  Cardizem rate lowered to 5mg /hr.  Patient rate continues to drop into the 50s.  Drip stopped and MD notified.  MD stated to leave drip off and restart if patient rate became greater than 100.

## 2020-03-03 NOTE — Progress Notes (Signed)
ANTICOAGULATION CONSULT NOTE - Initial Consult  Pharmacy Consult for warfarin Indication: atrial fibrillation  Allergies  Allergen Reactions  . Peanut-Containing Drug Products Rash    Patient Measurements: Height: 5\' 2"  (157.5 cm) Weight: 47.6 kg (105 lb) IBW/kg (Calculated) : 50.1  Vital Signs: Temp: 98.2 F (36.8 C) (09/03 1428) Temp Source: Oral (09/03 1428) BP: 152/86 (09/03 1935) Pulse Rate: 81 (09/03 1935)  Labs: Recent Labs    03/03/20 1601  HGB 13.8  HCT 42.4  PLT 191  LABPROT 24.1*  INR 2.3*  CREATININE 0.93  TROPONINIHS <2    Estimated Creatinine Clearance: 32.6 mL/min (by C-G formula based on SCr of 0.93 mg/dL).   Medical History: Past Medical History:  Diagnosis Date  . Coronary artery disease     Medications:  Infusions:  . diltiazem (CARDIZEM) infusion 10 mg/hr (03/03/20 1820)    Assessment: 88yoF restarting warfarin from home, s/p cardioversion in the ED for afib w/ RVR. Warfarin dose is 10mg  on Tuesdays and 7.5mg  on all other days. INR today is 2.3.   Goal of Therapy:  INR 2-3 Monitor platelets by anticoagulation protocol: Yes   Plan:  Continue warfarin 10mg  on Tuesdays and 7.5mg  on all other days Monitor daily INR, CBC  Mercy Riding, PharmD PGY1 Acute Care Pharmacy Resident Please refer to [facility] for unit-specific pharmacist

## 2020-03-03 NOTE — ED Provider Notes (Signed)
.Critical Care Performed by: Maudie Flakes, MD Authorized by: Maudie Flakes, MD   Critical care provider statement:    Critical care time (minutes):  32   Critical care was necessary to treat or prevent imminent or life-threatening deterioration of the following conditions: A. fib with RVR.   Critical care was time spent personally by me on the following activities:  Discussions with consultants, evaluation of patient's response to treatment, examination of patient, ordering and performing treatments and interventions, ordering and review of laboratory studies, ordering and review of radiographic studies, pulse oximetry, re-evaluation of patient's condition, obtaining history from patient or surrogate and review of old charts .Sedation  Date/Time: 03/03/2020 7:36 PM Performed by: Maudie Flakes, MD Authorized by: Maudie Flakes, MD   Consent:    Consent obtained:  Verbal and written   Consent given by:  Patient   Risks discussed:  Allergic reaction, dysrhythmia, inadequate sedation, nausea, vomiting, respiratory compromise necessitating ventilatory assistance and intubation and prolonged hypoxia resulting in organ damage Universal protocol:    Immediately prior to procedure a time out was called: yes     Patient identity confirmation method:  Arm band, hospital-assigned identification number, verbally with patient and provided demographic data Indications:    Procedure performed:  Cardioversion   Procedure necessitating sedation performed by:  Different physician Pre-sedation assessment:    Time since last food or drink:  4 hours   ASA classification: class 2 - patient with mild systemic disease     Neck mobility: normal     Mouth opening:  3 or more finger widths   Mallampati score:  I - soft palate, uvula, fauces, pillars visible   Pre-sedation assessments completed and reviewed: airway patency, cardiovascular function, hydration status, mental status, nausea/vomiting, pain level,  respiratory function and temperature   Immediate pre-procedure details:    Reassessment: Patient reassessed immediately prior to procedure     Reviewed: vital signs and relevant labs/tests     Verified: bag valve mask available, emergency equipment available, intubation equipment available, IV patency confirmed, oxygen available and suction available   Procedure details (see MAR for exact dosages):    Preoxygenation:  Nasal cannula   Sedation:  Etomidate   Intended level of sedation: deep   Intra-procedure monitoring:  Cardiac monitor, blood pressure monitoring, continuous capnometry, continuous pulse oximetry, frequent vital sign checks and frequent LOC assessments   Intra-procedure events: none     Total Provider sedation time (minutes):  16 Post-procedure details:    Attendance: Constant attendance by certified staff until patient recovered     Recovery: Patient returned to pre-procedure baseline     Post-sedation assessments completed and reviewed: airway patency, cardiovascular function, hydration status, mental status, nausea/vomiting, pain level, respiratory function and temperature     Patient is stable for discharge or admission: yes     Patient tolerance:  Tolerated well, no immediate complications Comments:     Uncomplicated sedation using 5 mg etomidate Ultrasound ED Thoracic  Date/Time: 03/03/2020 7:38 PM Performed by: Maudie Flakes, MD Authorized by: Maudie Flakes, MD   Procedure details:    Indications comment:  Evaluate for pneumothorax   Assessment for:  Pneumothorax   Left lung pleural:  Visualized   Right lung pleural:  Visualized   Images: archived   Right Lung Findings:     right lung sliding Left Lung Findings:     left lung sliding Impression:    Impression comment:  Normal Comments:  Chest x-ray with question of skinfold versus pneumothorax.  Clinically very low suspicion for pneumothorax, ultrasound used for further verification.  No evidence of  pneumothorax.      Maudie Flakes, MD 03/03/20 [phone]

## 2020-03-03 NOTE — ED Provider Notes (Signed)
[facility]ERGENCY DEPARTMENT Provider Note   CSN: [id number] Arrival date & time: 03/03/20  1414     History Chief Complaint  Patient presents with  . Fall  . Loss of Consciousness    Cynthia Vargas is a 86 y.o. female.  HPI   86 year old female with a history of CAD, A. fib, who presents to the emergency department today for evaluation of syncope.  Patient states she was at her cardiologist office for evaluation of generalized weakness, shortness of breath and chest pressure for the last several weeks.  When she was there she had a syncopal episode.  Denies any preceding symptoms.  States she did hit her head and lose consciousness.  She is complaining of pain to the right side of her head.  She otherwise does not have any injuries from the fall.  She is complaining of some shortness of breath currently.  She denies any recent coughing or lower extremity swelling.  Denies any recent vomiting or diarrhea or fevers.  States she is on warfarin.  Cardiology: Dr. Fransico Him  Past Medical History:  Diagnosis Date  . Coronary artery disease     There are no problems to display for this patient.   History reviewed. No pertinent surgical history.   OB History   No obstetric history on file.     History reviewed. No pertinent family history.  Social History   Tobacco Use  . Smoking status: Never Smoker  . Smokeless tobacco: Never Used  Substance Use Topics  . Alcohol use: Yes  . Drug use: Never    Home Medications Prior to Admission medications   Medication Sig Start Date End Date Taking? Authorizing Provider  atorvastatin (LIPITOR) 20 MG tablet Take 20 mg by mouth daily. 12/26/19  Yes [provider]  Bromfenac Sodium (PROLENSA) 0.07 % SOLN Apply to eye.   Yes [provider]  Calcium Carbonate (CALCIUM 500 PO) Take 2 tablets by mouth 2 (two) times daily.   Yes [provider]  diltiazem (CARDIZEM CD) 120 MG 24 hr  capsule Take 120 mg by mouth daily. 02/16/20  Yes [provider]  Thiamine HCl (VITAMIN B-1 PO) Take 1 tablet by mouth daily.   Yes [provider]  warfarin (COUMADIN) 5 MG tablet Take 7.5-10 mg by mouth See admin instructions. Take 2 tablets (10 mg) by mouth on Tuesday nights, take 1 1/2 tablets (7.5 mg) on all other nights of the week. 01/06/20  Yes [provider]  moxifloxacin (VIGAMOX) 0.5 % ophthalmic solution Apply to eye. 02/08/20   [provider]  Multiple Vitamin (MULTIVITAMIN WITH MINERALS) TABS tablet Take 1 tablet by mouth daily.    [provider]  prednisoLONE acetate (PRED FORTE) 1 % ophthalmic suspension 1 drop 4 (four) times daily. 02/08/20   [provider]    Allergies    Peanut-containing drug products  Review of Systems   Review of Systems  Constitutional: Negative for fever.  HENT: Negative for ear pain and sore throat.   Eyes: Negative for visual disturbance.  Respiratory: Positive for shortness of breath. Negative for cough.   Cardiovascular: Positive for chest pain. Negative for palpitations.  Gastrointestinal: Negative for abdominal pain, constipation, diarrhea, nausea and vomiting.  Genitourinary: Negative for flank pain.  Musculoskeletal: Negative for back pain and neck pain.  Skin: Negative for rash.  Neurological: Positive for syncope and headaches. Negative for dizziness, weakness, light-headedness and numbness.  All other systems reviewed and  are negative.   Physical Exam Updated Vital Signs BP (!) 142/112 (BP Location: Left Arm)   Pulse (!) 134   Temp 98.2 F (36.8 C) (Oral)   Resp 18   Ht 5\' 2"  (1.575 m)   Wt 47.6 kg   SpO2 98%   BMI 19.20 kg/m   Physical Exam Vitals and nursing note reviewed.  Constitutional:      General: She is not in acute distress.    Appearance: She is well-developed.  HENT:     Head: Normocephalic and atraumatic.  Eyes:     Conjunctiva/sclera: Conjunctivae  normal.  Cardiovascular:     Rate and Rhythm: Tachycardia present.     Heart sounds: Normal heart sounds. No murmur heard.      Comments: Irregular rhythm Pulmonary:     Effort: Pulmonary effort is normal. No respiratory distress.     Breath sounds: Normal breath sounds. No wheezing, rhonchi or rales.  Abdominal:     General: Bowel sounds are normal.     Palpations: Abdomen is soft.     Tenderness: There is no abdominal tenderness. There is no guarding or rebound.  Musculoskeletal:     Cervical back: Neck supple.     Comments: No TTP to the cervical, thoracic, or lumbar spine. No TTP to the bilat chest wall or hips.  Skin:    General: Skin is warm and dry.  Neurological:     Mental Status: She is alert.     Comments: Mental Status:  Alert, thought content appropriate, able to give a coherent history. Speech fluent without evidence of aphasia. Able to follow 2 step commands without difficulty.  Cranial Nerves:  II:  Pupils equal, round, reactive to light III,IV, VI: ptosis not present, extra-ocular motions intact bilaterally  V,VII: smile symmetric, facial light touch sensation equal VIII: hearing grossly normal to voice  X: uvula elevates symmetrically  XI: bilateral shoulder shrug symmetric and strong XII: midline tongue extension without fassiculations Motor:  Normal tone. 5/5 strength of BUE and BLE major muscle groups including strong and equal grip strength and dorsiflexion/plantar flexion Sensory: light touch normal in all extremities. Gait: not assessed as pt in ccollar and has not had imaging at the time of my assessment      ED Results / Procedures / Treatments   Labs (all labs ordered are listed, but only abnormal results are displayed) Labs Reviewed  COMPREHENSIVE METABOLIC PANEL  CBC  URINALYSIS, ROUTINE W REFLEX MICROSCOPIC  PROTIME-INR  BRAIN NATRIURETIC PEPTIDE  MAGNESIUM  SAMPLE TO BLOOD BANK  TROPONIN I (HIGH SENSITIVITY)    EKG EKG  Interpretation  Date/Time:  Friday March 03 2020 14:16:26 EDT Ventricular Rate:  151 PR Interval:    QRS Duration: 74 QT Interval:  310 QTC Calculation: 491 R Axis:   58 Text Interpretation: Atrial flutter with variable A-V block Low voltage QRS Nonspecific ST and T wave abnormality Abnormal ECG No previous ECGs available Confirmed by Gareth Morgan [phone]) on 03/03/2020 3:36:04 PM   Radiology No results found.  Procedures Procedures (including critical care time)  CRITICAL CARE Performed by: Rodney Booze   Total critical care time: 31 minutes  Critical care time was exclusive of separately billable procedures and treating other patients.  Critical care was necessary to treat or prevent imminent or life-threatening deterioration.  Critical care was time spent personally by me on the following activities: development of treatment plan with patient and/or surrogate as well as nursing, discussions with consultants, evaluation  of patient's response to treatment, examination of patient, obtaining history from patient or surrogate, ordering and performing treatments and interventions, ordering and review of laboratory studies, ordering and review of radiographic studies, pulse oximetry and re-evaluation of patient's condition.   Medications Ordered in ED Medications  diltiazem (CARDIZEM) 1 mg/mL load via infusion 10 mg (has no administration in time range)    And  diltiazem (CARDIZEM) 125 mg in dextrose 5% 125 mL (1 mg/mL) infusion (has no administration in time range)    ED Course  I have reviewed the triage vital signs and the nursing notes.  Pertinent labs & imaging results that were available during my care of the patient were reviewed by me and considered in my medical decision making (see chart for details).    MDM Rules/Calculators/A&P                          86 year old female presenting for evaluation of syncope that occurred while at her cardiologist office  today.  Has been having some chest pressure and generalized weakness for the last several weeks.  Did sustain head trauma.  States she is on warfarin.  Initial EKG showed a flutter with variable AV V block, low voltage QRS, nonspecific ST-T wave abnormality.  Blood pressure is stable and patient not altered.  Dill bolus and drip was ordered for patient after discussion with supervising physician Dr. Billy Fischer.  Work-up initiated with laboratory work, chest x-ray, CT scan of the head/cervical spine.  At shift change, care transition to Carmon Sails, PA-C with plan to follow-up on pending laboratory work, chest x-ray and imaging.  Patient will likely require admission and cardiology consultation.  Final Clinical Impression(s) / ED Diagnoses Final diagnoses:  Syncope  Atrial flutter with rapid ventricular response [facility])    Rx / DC Orders ED Discharge Orders    None       Rodney Booze, PA-C 03/03/20 1604    Gareth Morgan, MD 03/07/20 2259

## 2020-03-03 NOTE — Progress Notes (Signed)
Patient arrived to the unit alert and oriented but still drowsy from procedure.  Patient was in sinus rhythm in the ED but once place don our monitors patient was back in afib rvr.  Cardizem drip was already running at 5mg /hr.  Dose increased to 10mg /hr.  Cardiology MD notified.

## 2020-03-03 NOTE — CV Procedure (Signed)
   Electrical Cardioversion Procedure Note Cynthia Vargas [id number] 02-May-1932  Procedure: Electrical Cardioversion Indications:  Atrial Flutter with RVR  Time Out: Verified patient identification, verified procedure,medications/allergies/relevent history reviewed, required imaging and test results available.  Performed  Procedure Details  The patient was NPO after midnight. Anesthesia was administered at the beside by Dr. Gerlene Fee with 5mg  of Etomidate.  Cardioversion was done with synchronized biphasic defibrillation with AP pads with 150watts.  The patient converted to normal sinus rhythm. The patient tolerated the procedure well   IMPRESSION:  Successful cardioversion of atrial flutter    Cynthia Vargas 03/03/2020, 7:38 PM

## 2020-03-03 NOTE — Progress Notes (Signed)
Orthopedic Tech Progress Note Patient Details:  Cynthia Vargas 1932/01/07 [id number] Level 2 trauma Patient ID: Cynthia Vargas, female   DOB: 1932/05/07, 86 y.o.   MRN: [id number]   Janit Pagan 03/03/2020, 2:31 PM

## 2020-03-03 NOTE — H&P (Signed)
History & Physical    Patient ID: Cynthia Vargas MRN: [id number], DOB/AGE: 1931-11-22   Admit date: 03/03/2020   Primary Physician: Hoyt Koch, MD Primary Cardiologist: Fransico Him, MD  Patient Profile    86 year old female with a history of hypertension, nonobstructive CAD, stage IIb carcinoma of the lung, mild to moderate valvular heart disease, history of pericarditis with moderate pericardial effusion with subsequent resolution, hyperlipidemia, GERD, and paroxysmal atrial fibrillation, who presented to the emergency department today following a syncopal episode.  Past Medical History    Past Medical History:  Diagnosis Date  . Acute pericarditis 11/01/2015   a. moderate pericardial effusion by echo 09/2015 - signficantly improved with trivial effusion on repeat echo after NSAIDS and colchicine 10/2015; b. 07/2019 Small effusion on echo.  [name] CALCANEAL FRACTURE, RIGHT 11/04/2007  . Essential hypertension   . GERD (gastroesophageal reflux disease)   . Hyperlipidemia LDL goal <70 02/08/2016  . KNEE PAIN, RIGHT, CHRONIC 03/19/2010  . Lung nodule    a. found on CT 2017 - possible stage IIB carcinoma.  . Mild Aortic insufficiency   . Mild Mitral regurgitation   . Mild to mod Tricuspid regurgitation   . Nonobstructive CAD (coronary artery disease)    a. 11/2017 MV: EF 71%. No ischemia; b. 12/2017 Cor CTA: 0-25% ostial LM and 40-69% pLAD with normal FFR.  . OSTEOPOROSIS 05/02/2007  . PAF (paroxysmal atrial fibrillation) ([place])    a. Dx 03/2018--converted on oral Dilt.  CHA2DS2VASc = 5-->warfarin.  [name] PEPTIC ULCER DISEASE 05/02/2007  . Valvular heart disease    a. 07/2019 Echo: EF 60-65%, Sev vil LA. Sm pericardial eff. Mild MR/AI. Mild to mod TR.    Past Surgical History:  Procedure Laterality Date  . Calcaneal fracture, right    . CHOLECYSTECTOMY    . DILATION AND CURETTAGE OF UTERUS    . ESOPHAGOGASTRODUODENOSCOPY N/A 03/30/2013   Procedure: ESOPHAGOGASTRODUODENOSCOPY (EGD);   Surgeon: Jerene Bears, MD;  Location: [facility];  Service: Endoscopy;  Laterality: N/A;  . FUDUCIAL PLACEMENT N/A 09/29/2015   Procedure:  PLACEMENT OF FUDUCIAL Marker times three;  Surgeon: Grace Isaac, MD;  Location: [facility];  Service: Thoracic;  Laterality: N/A;  . LUNG BIOPSY N/A 09/29/2015   Procedure: LUNG BIOPSY;  Surgeon: Grace Isaac, MD;  Location: [facility];  Service: Thoracic;  Laterality: N/A;  . ORIF HIP FRACTURE    . TONSILLECTOMY    . VIDEO BRONCHOSCOPY WITH ENDOBRONCHIAL NAVIGATION N/A 09/29/2015   Procedure: VIDEO BRONCHOSCOPY WITH ENDOBRONCHIAL NAVIGATION;  Surgeon: Grace Isaac, MD;  Location: [facility];  Service: Thoracic;  Laterality: N/A;     Allergies  Allergies  Allergen Reactions  . Peanut-Containing Drug Products Other (See Comments)    angioedema    History of Present Illness    86 year old female with the above past medical history including hypertension, nonobstructive CAD, stage IIb carcinoma of the lung, mild to moderate valvular heart disease, history of pericarditis with moderate pericardial fusion and subsequent resolution, hyperlipidemia, GERD, and paroxysmal atrial fibrillation on warfarin.  In June 2019, she was evaluated for chest pain with a stress test which was nonischemic.  This was followed by coronary CT angiography which showed a calcium score of 127 with mild CAD in the ostial left main and moderate disease in the proximal LAD.  FFR was normal and disease was not felt to be significant.  In September 2019, she developed atrial fibrillation with rapid ventricular response.  Amlodipine was changed  to Cardizem with rate control achieved.  She was initially placed on Eliquis however this is subsequently been changed to warfarin.  By the time she followed up in the office in late September 2019, she was back in sinus rhythm.  Over the past 1 to 2 months, she has been experiencing intermittent chest discomfort, typically occurring at rest.  She  lives by herself locally and walks her dog every day without symptoms or limitations, though she notes she does walk slowly.  She says that sometimes if she overdoes it, the following day she will be very fatigued and is usually when she is lying down at night that she might notice mild chest discomfort.  Over the past 2 weeks, she is also been experiencing lightheadedness occurring most mornings of the week.  She usually has to steady herself on a dining room chair and then sit down.  After the initial spell in the morning, she generally does not experience any additional lightheadedness the remainder of the day.  Because of the symptoms, she arrange for a follow-up appointment with Dr. Radford Pax today.  After parking at our [employer], she walked across the street and was right in front of the building when she had sudden syncope without any prodromal symptoms.  She fell forward into some shrubbery.  She does not necessarily remember falling but she does remember feeling branches against her body at some point.  She is not sure how long she was without consciousness but does not believe it was very long.  She was attended to by a [facility] and she said upon regaining consciousness, she felt lightheaded and dizzy.  She did not have any chest pain, dyspnea, diaphoresis, nausea, or vomiting.  EMS was called and on arrival, patient was found to be in atrial fibrillation with rapid ventricular response.  She was [facility] to [facility] ED where CT of the head and cervical spine were without acute changes.  Lab work notable for mild elevation of BNP at 479.8.  High-sensitivity troponin was normal at less than 2.  INR is therapeutic at 2.3.  She was placed on IV diltiazem with relative rate control.  She is currently symptomatic and diltiazem has been increased to 10 mg/h in the setting of ongoing atrial fibrillation.  Home Medications    Prior to Admission medications   Medication Sig Start Date End Date Taking?  Authorizing Provider  alendronate (FOSAMAX) 70 MG tablet Take 70 mg by mouth once a week. Take with a full glass of water on an empty stomach.    [provider]  atorvastatin (LIPITOR) 20 MG tablet Take 1 tablet (20 mg total) by mouth daily at 6 PM. 08/18/19   Turner, Eber Hong, MD  diltiazem (CARDIZEM CD) 120 MG 24 hr capsule Take 1 capsule (120 mg total) by mouth daily. 08/18/19   Sueanne Margarita, MD  Multiple Vitamin (MULTIVITAMIN) tablet Take 1 tablet by mouth daily.    [provider]  Multiple Vitamins-Minerals (MULTIVITAMIN PO) Take 1 tablet by mouth.    [provider]  nitroGLYCERIN (NITROSTAT) 0.4 MG SL tablet Place 0.4 mg under the tongue every 5 (five) minutes as needed for chest pain.    [provider]  warfarin (COUMADIN) 5 MG tablet TAKE 1 AND 1/2 TABLET DAILY EXCEPT 2 TABLETS ON TUESDAY AND THURSDAY OR AS DIRECTED BY COUMADIN CLINIC 01/06/20   Sueanne Margarita, MD    Family History    Family History  Problem Relation  Age of Onset  . Cancer Father   . Heart disease Mother   . Diabetes Neg Hx   . Hyperlipidemia Neg Hx   . Hypertension Neg Hx    She indicated that her mother is deceased. She indicated that her father is deceased. She indicated that her maternal grandmother is deceased. She indicated that her maternal grandfather is deceased. She indicated that her paternal grandmother is deceased. She indicated that her paternal grandfather is deceased. She indicated that the status of her neg hx is unknown.   Social History    Social History   Socioeconomic History  . Marital status: Single    Spouse name: Not on file  . Number of children: 1  . Years of education: 66  . Highest education level: Not on file  Occupational History  . Occupation: reitred  Tobacco Use  . Smoking status: Never Smoker  . Smokeless tobacco: Never Used  Vaping Use  . Vaping Use: Never used  Substance and Sexual Activity  . Alcohol use: Yes     Alcohol/week: 1.0 standard drink    Types: 1 Glasses of wine per week    Comment: very rarely on special occassionally  . Drug use: No  . Sexual activity: Never  Other Topics Concern  . Not on file  Social History Narrative   [employer] degrees in Development worker, community. married for 2 years then single . 1 son - Harvie Junior [place]. 2 daughters-put up for adoption. Lives alone and is independent in ADL's. works at [employer] until injured.   Social Determinants of Health   Financial Resource Strain: Low Risk   . Difficulty of Paying Living Expenses: Not hard at all  Food Insecurity:   . Worried About Charity fundraiser in the Last Year: Not on file  . Ran Out of Food in the Last Year: Not on file  Transportation Needs:   . Lack of Transportation (Medical): Not on file  . Lack of Transportation (Non-Medical): Not on file  Physical Activity:   . Days of Exercise per Week: Not on file  . Minutes of Exercise per Session: Not on file  Stress:   . Feeling of Stress : Not on file  Social Connections:   . Frequency of Communication with Friends and Family: Not on file  . Frequency of Social Gatherings with Friends and Family: Not on file  . Attends Religious Services: Not on file  . Active Member of Clubs or Organizations: Not on file  . Attends Archivist Meetings: Not on file  . Marital Status: Not on file  Intimate Partner Violence:   . Fear of Current or Ex-Partner: Not on file  . Emotionally Abused: Not on file  . Physically Abused: Not on file  . Sexually Abused: Not on file     Review of Systems    General: Sometimes feels fatigued the next day after "overdoing it."  No chills, fever, night sweats or weight changes.  Cardiovascular:  +++ chest pain, no dyspnea on exertion, edema, orthopnea, palpitations, paroxysmal nocturnal dyspnea. +++ presyncope in the mornings over the past 2 weeks with syncope occurring in our office parking lot this morning. Dermatological: No rash,  lesions/masses Respiratory: No cough, dyspnea Urologic: No hematuria, dysuria Abdominal:   No nausea, vomiting, diarrhea, bright red blood per rectum, melena, or hematemesis Neurologic:  No visual changes, wkns, changes in mental status. All other systems reviewed and are otherwise negative except as noted above.  Physical Exam  Vitals:   03/03/20 1830 03/03/20 1833  BP: 134/88   Pulse: (!) 152 92  Resp: 12 14  Temp:    SpO2: 96% 96%    General: Pleasant, NAD Psych: Normal affect. Neuro: Alert and oriented X 3. Moves all extremities spontaneously. HEENT: Normal  Neck: Supple without bruits or JVD. Lungs:  Resp regular and unlabored, CTA. Heart: Irregularly irregular, tachycardic, no s3, s4, or murmurs. Abdomen: Soft, non-tender, non-distended, BS + x 4.  Extremities: No clubbing, cyanosis or edema. DP/PT/Radials 2+ and equal bilaterally.  Labs    Lab Results  Component Value Date   WBC 5.7 03/03/2020   HGB 13.8 03/03/2020   HCT 42.4 03/03/2020   MCV 94.2 03/03/2020   PLT 191 03/03/2020   Lab Results  Component Value Date   CREATININE 0.93 03/03/2020   BUN 14 03/03/2020   NA 131 (L) 03/03/2020   K 4.7 03/03/2020   CL 101 03/03/2020   CO2 19 (L) 03/03/2020   BNP 479.8 High-sensitivity troponin less than 2 INR 2.3   Radiology Studies    CT of the head and cervical spine 9.3.2021  IMPRESSION: CT head:  1. No evidence of acute intracranial abnormality. 2. Moderate generalized parenchymal atrophy with advanced cerebral white matter chronic small vessel ischemic disease. These findings have progressed as compared to the head CT of 03/29/2013. 3. Tiny chronic right basal ganglia lacunar infarct, new as compared to this prior exam. 4. Mild ethmoid sinus mucosal thickening.  CT cervical spine:  1. Mildly motion degraded examination. 2. No evidence of acute fracture to the cervical spine. 3. Rightward rotation of C1 upon C2 which may be related to  patient head positioning at the time of examination. Clinical correlation is recommended. 4. Trace C4-C5 grade 1 retrolisthesis and C6-C7 grade 1 anterolisthesis. 5. Cervical spondylosis as described _____________   Chest x-ray 9.3.2021  IMPRESSION: Curvilinear well-defined edge projecting over the lateral apical aspect of the right lung likely represents a skin fold considering vascular markings extend beyond that line. If high clinical suspicion for pneumothorax, consider repeating chest x-ray for re-evaluation.  No active cardiopulmonary disease. _____________   ECG & Cardiac Imaging    Atrial fibrillation, 117, nonspecific ST and T changes- personally reviewed.  Assessment & Plan    1.  Syncope: Over the past 2 weeks, patient has been experiencing presyncope at home, typically occurring when she gets up in the morning and walks downstairs to her dining room.  She had not experienced syncope until today, when she was walking into our office and had sudden syncope without any significant prodromal symptoms.  She is not sure if she remembers falling.  She felt lightheaded upon arousal but was without chest pain, dyspnea, nausea, or vomiting.  She was found to be in A. fib with RVR upon EMS arrival.  She remains in A. fib at this time.  She denies any palpitations either now or at home recently.  Heart rates are improving with IV diltiazem.  We will plan to admit to telemetry and follow-up echocardiogram.  Check orthostatic blood pressures.  Management of A. fib as below.  2.  Paroxysmal atrial fibrillation with rapid ventricular response: A. fib previously diagnosed in 2019 though she converted with oral diltiazem at that time.  She is unaware of any recurrent atrial fibrillation between then and now.  She was found to be in rapid A. fib by EMS following syncopal episode earlier today.  Rates are now trending down with titration of  IV diltiazem.  It is not clear that syncope is directly  attributable to A. fib.  Continue IV diltiazem.  INR is therapeutic on warfarin (CHA2DS2-VASc equals 5)-continue.  If she does not convert on diltiazem, will likely need to consider cardioversion or antiarrhythmic prior to discharge.  It is notable that echocardiogram in January did show a severely dilated left atrium.  3.  Essential hypertension: Mildly elevated in the ED.  Continue IV diltiazem and follow.  She was on oral diltiazem at home.  4.  History of pericardial effusion/pericarditis: This dates back to 2017. No rub on exam. She had only a small effusion on echo in January 2021.  Follow-up echo.  Signed, Murray Hodgkins, NP 03/03/2020, 6:43 PM

## 2020-03-03 NOTE — ED Triage Notes (Signed)
Patient presents to ED via [facility] states she drove herself to the doctor office today for check up on having chest apin and weakness for 1 month however no pain today, states she walked into the doctors office and had a syncopal episode , hitting her head. Hematoma right temporal area. Patient was given 500c ND per ems. Upon arrival alert  Oriented denies any pain , only c/o feeling weak.

## 2020-03-03 NOTE — ED Provider Notes (Signed)
On way to see cards Dr Champ Mungo, states she did not make it to the office because had passing out episode Chest pressure and SOB for weeks gen weakness dizzy spells Syncope hit head pending scan, on warfarin? Eliquis?  H/o a fib  Aflutter on arrival HR 130s. States compliant with blood thinner but doesn't know which one she is taking.  Dr Radford Pax clinic note from today on chart. Unclear if patient actually saw Dr Radford Pax or fell before making it to appointment   Physical Exam  BP 134/88   Pulse 92   Temp 98.2 F (36.8 C) (Oral)   Resp 14   Ht 5\' 2"  (1.575 m)   Wt 47.6 kg   SpO2 96%   BMI 19.20 kg/m   Physical Exam Constitutional:      Appearance: She is well-developed.  HENT:     Head: Normocephalic.     Nose: Nose normal.  Eyes:     General: Lids are normal.  Cardiovascular:     Rate and Rhythm: Normal rate.  Pulmonary:     Effort: Pulmonary effort is normal. No respiratory distress.     Comments: Normal chest wall, no tenderness. Normal lung sounds  Musculoskeletal:        General: Normal range of motion.     Cervical back: Normal range of motion.  Neurological:     Mental Status: She is alert.  Psychiatric:        Behavior: Behavior normal.     ED Course/Procedures   Clinical Course as of Mar 04 1903  Fri Mar 03, 2020  1732 CT head: 1. No evidence of acute intracranial abnormality. 2. Moderate generalized parenchymal atrophy with advanced cerebral white matter chronic small vessel ischemic disease. These findings have progressed as compared to the head CT of 03/29/2013. 3. Tiny chronic right basal ganglia lacunar infarct, new as compared to this prior exam. 4. Mild ethmoid sinus mucosal thickening.  CT cervical spine: 1. Mildly motion degraded examination. 2. No evidence of acute fracture to the cervical spine. 3. Rightward rotation of C1 upon C2 which may be related to patient head positioning at the time of examination. Clinical correlation is  recommended. 4. Trace C4-C5 grade 1 retrolisthesis and C6-C7 grade 1 anterolisthesis. 5. Cervical spondylosis as described.  CT Head Wo Contrast [CG]  1733 Platelets: 191 [CG]  1733 No previous to compare, no signs of hypervolemia on exam. Normal CXR  B Natriuretic Peptide(!): 479.8 [CG]  1733 Curvilinear well-defined edge projecting over the lateral apical aspect of the right lung likely represents a skin fold considering vascular markings extend beyond that line. If high clinical suspicion for pneumothorax, consider repeating chest x-ray for re-evaluation.  No active cardiopulmonary disease.    DG Chest 1 View [CG]  1733 Hemoglobin: 13.8 [CG]  1733 Magnesium: 2.1 [CG]  1734 INR(!): 2.3 [CG]  9509 Spoke to Dr Oval Linsey, states cardiology team will cardiovert this patient in ER.    [CG]  1803 Spoke to Dr Oval Linsey who has seen patient in ER.  States to arrange for CV in ER and page Dr Radford Pax who will come to ER to perform CV.  Recommends admission after CV. RN Annie Main notified of CV   [CG]    Clinical Course User Index [CG] Kinnie Feil, PA-C    .Critical Care Performed by: Kinnie Feil, PA-C Authorized by: Kinnie Feil, PA-C   Critical care provider statement:    Critical care time (minutes):  45  Critical care was necessary to treat or prevent imminent or life-threatening deterioration of the following conditions: atrial fibrillation with RVR, cardioversion in ER with admission.   Critical care was time spent personally by me on the following activities:  Discussions with consultants, evaluation of patient's response to treatment, examination of patient, ordering and performing treatments and interventions, ordering and review of laboratory studies, ordering and review of radiographic studies, pulse oximetry, re-evaluation of patient's condition, obtaining history from patient or surrogate and review of old charts   I assumed direction of critical care for  this patient from another provider in my specialty: no      MDM    1904: ER work up personally reviewed.    CXR showing curvilinear edge over lateral apical right lung possible PTX vs skin fold.  Patient did have a fall today but exam reveals no chest wall tenderness, normal lungs no clinical symptoms to suggest PTX. BNP 479, no previous and CXR without edema.  Bedside US by EDP Bero shows normal pleural movement. CT show progression of ischemic changes but non traumatic. Cervical CT shows rotation of C1 on C2 but patient has no midline c-spine tenderness and has full ROM of neck without any pain.    PT INR therapeutic, patient has confirmed anticoagulant and complete adherence to this without missing any doses.    Trop undetectable. She has had "chest pressure".   1935: CV by Dr Radford Pax in ER, sedation by EDP Bero.  Cardiology has admitted patient, recommending cardizem @ 5 ml/hr       Arlean Hopping 03/03/20 1938    Maudie Flakes, MD 03/03/20 2300

## 2020-03-04 ENCOUNTER — Observation Stay (HOSPITAL_COMMUNITY): Payer: Medicare Other

## 2020-03-04 DIAGNOSIS — I340 Nonrheumatic mitral (valve) insufficiency: Secondary | ICD-10-CM | POA: Diagnosis not present

## 2020-03-04 DIAGNOSIS — R55 Syncope and collapse: Secondary | ICD-10-CM | POA: Diagnosis not present

## 2020-03-04 DIAGNOSIS — I351 Nonrheumatic aortic (valve) insufficiency: Secondary | ICD-10-CM | POA: Diagnosis not present

## 2020-03-04 DIAGNOSIS — I480 Paroxysmal atrial fibrillation: Secondary | ICD-10-CM | POA: Diagnosis not present

## 2020-03-04 DIAGNOSIS — I361 Nonrheumatic tricuspid (valve) insufficiency: Secondary | ICD-10-CM | POA: Diagnosis not present

## 2020-03-04 LAB — BASIC METABOLIC PANEL
Anion gap: 9 (ref 5–15)
BUN: 10 mg/dL (ref 8–23)
CO2: 26 mmol/L (ref 22–32)
Calcium: 8.9 mg/dL (ref 8.9–10.3)
Chloride: 101 mmol/L (ref 98–111)
Creatinine, Ser: 0.9 mg/dL (ref 0.44–1.00)
GFR calc Af Amer: >60 mL/min (ref >60)
GFR calc non Af Amer: 58 mL/min — ABNORMAL LOW (ref >60)
Glucose, Bld: 116 mg/dL — ABNORMAL HIGH (ref 70–99)
Potassium: 4 mmol/L (ref 3.5–5.1)
Sodium: 136 mmol/L (ref 135–145)

## 2020-03-04 LAB — ECHOCARDIOGRAM COMPLETE
Area-P 1/2: 3.72 cm2
Height: 62 in
P 1/2 time: 522 msec
S' Lateral: 2.3 cm
Weight: 1664 oz

## 2020-03-04 LAB — PROTIME-INR
INR: 2.2 — ABNORMAL HIGH (ref 0.8–1.2)
Prothrombin Time: 23.8 seconds — ABNORMAL HIGH (ref 11.4–15.2)

## 2020-03-04 LAB — CBC
HCT: 41 % (ref 36.0–46.0)
Hemoglobin: 13.2 g/dL (ref 12.0–15.0)
MCH: 29.9 pg (ref 26.0–34.0)
MCHC: 32.2 g/dL (ref 30.0–36.0)
MCV: 93 fL (ref 80.0–100.0)
Platelets: 181 10*3/uL (ref 150–400)
RBC: 4.41 MIL/uL (ref 3.87–5.11)
RDW: 14.2 % (ref 11.5–15.5)
WBC: 4.6 10*3/uL (ref 4.0–10.5)
nRBC: 0 % (ref 0.0–0.2)

## 2020-03-04 LAB — TROPONIN I (HIGH SENSITIVITY): Troponin I (High Sensitivity): 5 ng/L (ref <18)

## 2020-03-04 LAB — SARS CORONAVIRUS 2 (TAT 6-24 HRS): SARS Coronavirus 2: NEGATIVE

## 2020-03-04 MED ORDER — DILTIAZEM HCL ER COATED BEADS 180 MG PO CP24
180.0000 mg | ORAL_CAPSULE | Freq: Every day | ORAL | Status: DC
  Administered 2020-03-04 – 2020-03-06 (×3): 180 mg via ORAL
  Filled 2020-03-04 (×3): qty 1

## 2020-03-04 NOTE — Progress Notes (Signed)
°   03/03/20 2022  Assess: MEWS Score  Temp 98.3 F (36.8 C)  BP (!) 142/91  Pulse Rate 88  ECG Heart Rate (!) 121  Resp 14  Level of Consciousness Alert  SpO2 97 %  O2 Device Room Air  Assess: MEWS Score  MEWS Temp 0  MEWS Systolic 0  MEWS Pulse 2  MEWS RR 0  MEWS LOC 0  MEWS Score 2  MEWS Score Color Yellow  Assess: if the MEWS score is Yellow or Red  Were vital signs taken at a resting state? Yes  Focused Assessment Change from prior assessment (see assessment flowsheet) (pt was sr in ed, afib on arrival to unit)  Early Detection of Sepsis Score *See Row Information* Low  MEWS guidelines implemented *See Row Information* Yes  Treat  MEWS Interventions Administered scheduled meds/treatments;Escalated (See documentation below)  Pain Scale 0-10  Pain Score 0  Take Vital Signs  Increase Vital Sign Frequency  Yellow: Q 2hr X 2 then Q 4hr X 2, if remains yellow, continue Q 4hrs  Escalate  MEWS: Escalate Yellow: discuss with charge nurse/RN and consider discussing with provider and RRT  Notify: Charge Nurse/RN  Name of Charge Nurse/RN Notified Danae Chen, RN  Date Charge Nurse/RN Notified 03/03/20  Time Charge Nurse/RN Notified 2030  Notify: Provider  Provider Name/Title Ahkter, MD  Date Provider Notified 03/03/20  Time Provider Notified 2025  Notification Type Page  Notification Reason Change in status  Response Other (Comment) (increase drip rate and monitor)  Date of Provider Response 03/03/20  Time of Provider Response 2026  Notify: Rapid Response  Name of Rapid Response RN Notified  (n/a)  Document  Patient Outcome Stabilized after interventions  Progress note created (see row info) Yes   Patient arrived to unit in afib with rvr.  Patient MEWS is yellow due to HR.  Yellow MEWS protocol initated.  MD notified of rhythm change.

## 2020-03-04 NOTE — Progress Notes (Signed)
ANTICOAGULATION CONSULT NOTE - Initial Consult  Pharmacy Consult for warfarin Indication: atrial fibrillation  Allergies  Allergen Reactions  . Peanut-Containing Drug Products Rash    Patient Measurements: Height: 5\' 2"  (157.5 cm) Weight: 47.2 kg (104 lb) IBW/kg (Calculated) : 50.1  Vital Signs: Temp: 97.6 F (36.4 C) (09/04 0839) Temp Source: Oral (09/04 0839) BP: 125/92 (09/04 0400) Pulse Rate: 88 (09/04 0400)  Labs: Recent Labs    03/03/20 1601 03/03/20 1601 03/03/20 2038 03/03/20 2204 03/04/20 0125  HGB 13.8  --   --   --  13.2  HCT 42.4  --   --   --  41.0  PLT 191  --   --   --  181  LABPROT 24.1*  --  23.0*  --  23.8*  INR 2.3*  --  2.1*  --  2.2*  CREATININE 0.93  --   --   --  0.90  TROPONINIHS <2   < > 4 4 5    < > = values in this interval not displayed.    Estimated Creatinine Clearance: 33.4 mL/min (by C-G formula based on SCr of 0.9 mg/dL).   Medical History: Past Medical History:  Diagnosis Date  . Coronary artery disease     Medications:  Infusions:  . diltiazem (CARDIZEM) infusion 7.5 mg/hr (03/04/20 0942)    Assessment: 88yoF restarting warfarin from home, s/p cardioversion in the ED for afib w/ RVR. Warfarin dose is 10mg  on Tuesdays and 7.5mg  on all other days. INR therapeutic on admission at 2.3.   INR continues to be therapeutic at 2.2 on home dose of warfarin. LFTs stable, no bleeding noted.   Goal of Therapy:  INR 2-3 Monitor platelets by anticoagulation protocol: Yes   Plan:  Continue warfarin 10mg  on Tuesdays and 7.5mg  on all other days Monitor INR, CBC  Romilda Garret, PharmD PGY1 Acute Care Pharmacy Resident Phone: [phone] 03/04/2020 10:07 AM  Please check [email or web address] for unit specific pharmacy phone numbers.

## 2020-03-04 NOTE — Progress Notes (Signed)
  Echocardiogram 2D Echocardiogram has been performed.  Cynthia Vargas 03/04/2020, 11:16 AM

## 2020-03-04 NOTE — Progress Notes (Signed)
Progress Note  Patient Name: Cynthia Vargas Date of Encounter: 03/04/2020  [facility] Cardiologist: Fransico Him, MD   Subjective   Feeling weak/tired but better with slower heart rate.   Inpatient Medications    Scheduled Meds:  atorvastatin  20 mg Oral Daily   diltiazem  180 mg Oral Daily   gatifloxacin  1 drop Right Eye QID   ketorolac  1 drop Right Eye QID   prednisoLONE acetate  1 drop Right Eye QID   [START ON 03/07/2020] warfarin  10 mg Oral Once per day on Tue   warfarin  7.5 mg Oral Once per day on Sun Mon Wed Thu Fri Sat   Warfarin - Pharmacist Dosing Inpatient   Does not apply q1600   Continuous Infusions:  diltiazem (CARDIZEM) infusion 7.5 mg/hr (03/04/20 0942)   PRN Meds: acetaminophen, ondansetron (ZOFRAN) IV   Vital Signs    Vitals:   03/04/20 0100 03/04/20 0353 03/04/20 0400 03/04/20 0839  BP: 104/74  (!) 125/92   Pulse: 71  88   Resp: 18  12   Temp: 97.9 F (36.6 C) 98.1 F (36.7 C)  97.6 F (36.4 C)  TempSrc:  Oral  Oral  SpO2: 94%  94%   Weight:  47.2 kg    Height:        Intake/Output Summary (Last 24 hours) at 03/04/2020 1306 Last data filed at 03/04/2020 0347 Gross per 24 hour  Intake 526.35 ml  Output 1250 ml  Net -723.65 ml   Last 3 Weights 03/04/2020 03/03/2020 03/03/2020  Weight (lbs) 104 lb 102 lb 105 lb  Weight (kg) 47.174 kg 46.267 kg 47.628 kg      Telemetry    Back in atrial fibrillation, briefly in sinus overnight but largely rate controlled afib - Personally Reviewed  ECG    9/3 post cardioversion, SR at 73 bpm. ECG on presentation appears to be atypical atrial flutter with variable conduction- Personally Reviewed  Physical Exam   GEN: No acute distress.   Neck: No JVD Cardiac: irregularly irregular, no murmurs, rubs, or gallops.  Respiratory: Clear to auscultation bilaterally. GI: Soft, nontender, non-distended  MS: No edema; No deformity. Neuro:  Nonfocal  Psych: Normal affect   Labs    High  Sensitivity Troponin:   Recent Labs  Lab 03/03/20 1601 03/03/20 2038 03/03/20 2204 03/04/20 0125  TROPONINIHS 2 4 4 5       Chemistry Recent Labs  Lab 03/03/20 1601 03/04/20 0125  NA 131* 136  K 4.7 4.0  CL 101 101  CO2 19* 26  GLUCOSE 101* 116*  BUN 14 10  CREATININE 0.93 0.90  CALCIUM 8.8* 8.9  PROT 6.7  --   ALBUMIN 4.1  --   AST 30  --   ALT 19  --   ALKPHOS 45  --   BILITOT 0.7  --   GFRNONAA 56* 58*  GFRAA >60 >60  ANIONGAP 11 9     Hematology Recent Labs  Lab 03/03/20 1601 03/04/20 0125  WBC 5.7 4.6  RBC 4.50 4.41  HGB 13.8 13.2  HCT 42.4 41.0  MCV 94.2 93.0  MCH 30.7 29.9  MCHC 32.5 32.2  RDW 14.2 14.2  PLT 191 181    BNP Recent Labs  Lab 03/03/20 1601  BNP 479.8*     DDimer No results for input(s): DDIMER in the last 168 hours.   Radiology    DG Chest 1 View  Result Date: 03/03/2020 CLINICAL DATA:  Chest pain and weakness X 1 month, syncopal event. EXAM: CHEST  1 VIEW COMPARISON:  Chest x-ray 09/29/2015, CT chest 01/14/2018 next FINDINGS: Tubes and medical apparatus overlying the right hemithorax. The heart size and mediastinal contours are unchanged. Redemonstration of a aortic arch calcification. Curvilinear well-defined edge projecting over the lateral apical aspect of the right lung with suggestion of vascular markings extending beyond that line superolaterally. Otherwise both lungs are clear. The visualized skeletal structures are unremarkable. Surgical clips overlie the right hemithorax. IMPRESSION: Curvilinear well-defined edge projecting over the lateral apical aspect of the right lung likely represents a skin fold considering vascular markings extend beyond that line. If high clinical suspicion for pneumothorax, consider repeating chest x-ray for re-evaluation. No active cardiopulmonary disease. Electronically Signed   By: Iven Finn M.D.   On: 03/03/2020 16:19   CT Head Wo Contrast  Result Date: 03/03/2020 CLINICAL DATA:  Head  trauma, minor. Spine fracture, cervical, traumatic. EXAM: CT HEAD WITHOUT CONTRAST CT CERVICAL SPINE WITHOUT CONTRAST TECHNIQUE: Multidetector CT imaging of the head and cervical spine was performed following the standard protocol without intravenous contrast. Multiplanar CT image reconstructions of the cervical spine were also generated. COMPARISON:  Head CT 03/30/2013. FINDINGS: CT HEAD FINDINGS Brain: Moderate generalized parenchymal atrophy. Advanced ill-defined hypoattenuation within the cerebral white matter which is nonspecific, but consistent with chronic small vessel ischemic disease. These findings have progressed as compared to the prior head CT of 03/29/2013. Additionally, a tiny chronic lacunar infarct within the right basal ganglia is new as compared to this prior exam. There is no acute intracranial hemorrhage. No demarcated cortical infarct. No extra-axial fluid collection. No evidence of intracranial mass. No midline shift. Vascular: No hyperdense vessel.  Atherosclerotic calcifications Skull: Normal. Negative for fracture or focal lesion. Sinuses/Orbits: Visualized orbits show no acute finding. Mild ethmoid sinus mucosal thickening. No significant mastoid effusion. CT CERVICAL SPINE FINDINGS Mildly motion degraded examination. Alignment: Rightward rotation of C1 upon C2 may be related to patient head positioning at the time of examination trace C4-C5 grade 1 retrolisthesis. Trace C6-C7 grade 1 anterolisthesis. Skull base and vertebrae: The basion-dental and atlanto-dental intervals are maintained.No evidence of acute fracture to the cervical spine. Soft tissues and spinal canal: No prevertebral fluid or swelling. No visible canal hematoma. Disc levels: Will spondylosis with multilevel disc space narrowing, posterior disc osteophytes, uncovertebral and facet hypertrophy. No high-grade bony spinal canal narrowing. Multilevel bony neural foraminal narrowing. Upper chest: No consolidation within the  imaged lung apices. No visible pneumothorax. Other: Calcified atherosclerotic plaque within the visualized aortic arch, proximal major branch vessels of the neck and at carotid bifurcations. IMPRESSION: CT head: 1. No evidence of acute intracranial abnormality. 2. Moderate generalized parenchymal atrophy with advanced cerebral white matter chronic small vessel ischemic disease. These findings have progressed as compared to the head CT of 03/29/2013. 3. Tiny chronic right basal ganglia lacunar infarct, new as compared to this prior exam. 4. Mild ethmoid sinus mucosal thickening. CT cervical spine: 1. Mildly motion degraded examination. 2. No evidence of acute fracture to the cervical spine. 3. Rightward rotation of C1 upon C2 which may be related to patient head positioning at the time of examination. Clinical correlation is recommended. 4. Trace C4-C5 grade 1 retrolisthesis and C6-C7 grade 1 anterolisthesis. 5. Cervical spondylosis as described. Electronically Signed   By: Kellie Simmering DO   On: 03/03/2020 17:26   CT Cervical Spine Wo Contrast  Result Date: 03/03/2020 CLINICAL DATA:  Head trauma, minor. Spine fracture,  cervical, traumatic. EXAM: CT HEAD WITHOUT CONTRAST CT CERVICAL SPINE WITHOUT CONTRAST TECHNIQUE: Multidetector CT imaging of the head and cervical spine was performed following the standard protocol without intravenous contrast. Multiplanar CT image reconstructions of the cervical spine were also generated. COMPARISON:  Head CT 03/30/2013. FINDINGS: CT HEAD FINDINGS Brain: Moderate generalized parenchymal atrophy. Advanced ill-defined hypoattenuation within the cerebral white matter which is nonspecific, but consistent with chronic small vessel ischemic disease. These findings have progressed as compared to the prior head CT of 03/29/2013. Additionally, a tiny chronic lacunar infarct within the right basal ganglia is new as compared to this prior exam. There is no acute intracranial hemorrhage. No  demarcated cortical infarct. No extra-axial fluid collection. No evidence of intracranial mass. No midline shift. Vascular: No hyperdense vessel.  Atherosclerotic calcifications Skull: Normal. Negative for fracture or focal lesion. Sinuses/Orbits: Visualized orbits show no acute finding. Mild ethmoid sinus mucosal thickening. No significant mastoid effusion. CT CERVICAL SPINE FINDINGS Mildly motion degraded examination. Alignment: Rightward rotation of C1 upon C2 may be related to patient head positioning at the time of examination trace C4-C5 grade 1 retrolisthesis. Trace C6-C7 grade 1 anterolisthesis. Skull base and vertebrae: The basion-dental and atlanto-dental intervals are maintained.No evidence of acute fracture to the cervical spine. Soft tissues and spinal canal: No prevertebral fluid or swelling. No visible canal hematoma. Disc levels: Will spondylosis with multilevel disc space narrowing, posterior disc osteophytes, uncovertebral and facet hypertrophy. No high-grade bony spinal canal narrowing. Multilevel bony neural foraminal narrowing. Upper chest: No consolidation within the imaged lung apices. No visible pneumothorax. Other: Calcified atherosclerotic plaque within the visualized aortic arch, proximal major branch vessels of the neck and at carotid bifurcations. IMPRESSION: CT head: 1. No evidence of acute intracranial abnormality. 2. Moderate generalized parenchymal atrophy with advanced cerebral white matter chronic small vessel ischemic disease. These findings have progressed as compared to the head CT of 03/29/2013. 3. Tiny chronic right basal ganglia lacunar infarct, new as compared to this prior exam. 4. Mild ethmoid sinus mucosal thickening. CT cervical spine: 1. Mildly motion degraded examination. 2. No evidence of acute fracture to the cervical spine. 3. Rightward rotation of C1 upon C2 which may be related to patient head positioning at the time of examination. Clinical correlation is  recommended. 4. Trace C4-C5 grade 1 retrolisthesis and C6-C7 grade 1 anterolisthesis. 5. Cervical spondylosis as described. Electronically Signed   By: Kellie Simmering DO   On: 03/03/2020 17:26   ECHOCARDIOGRAM COMPLETE  Result Date: 03/04/2020    ECHOCARDIOGRAM REPORT   Patient Name:   Cynthia Vargas [facility] Date of Exam: 03/04/2020 Medical Rec #:  [id number]          Height:       62.0 in Accession #:    [id number]         Weight:       104.0 lb Date of Birth:  1931-12-25          BSA:          1.448 m Patient Age:    86 years           BP:           125/92 mmHg Patient Gender: F                  HR:           68 bpm. Exam Location:  Inpatient Procedure: 2D Echo Indications:    syncope 780.2  History:  Patient has no prior history of Echocardiogram examinations.                 Signs/Symptoms:Syncope.  Sonographer:    Johny Chess Referring Phys: [id number] [place]  1. Left ventricular ejection fraction, by estimation, is 60 to 65%. The left ventricle has normal function. The left ventricle has no regional wall motion abnormalities. Left ventricular diastolic parameters are indeterminate.  2. Right ventricular systolic function is normal. The right ventricular size is normal. There is normal pulmonary artery systolic pressure.  3. Left atrial size was severely dilated.  4. Right atrial size was mild to moderately dilated.  5. The mitral valve is normal in structure. Mild mitral valve regurgitation. No evidence of mitral stenosis.  6. Tricuspid valve regurgitation is moderate.  7. The aortic valve is tricuspid. Aortic valve regurgitation is mild. Mild aortic valve sclerosis is present, with no evidence of aortic valve stenosis.  8. The inferior vena cava is normal in size with greater than 50% respiratory variability, suggesting right atrial pressure of 3 mmHg. Comparison(s): No significant change from prior study. Conclusion(s)/Recommendation(s): Otherwise normal echocardiogram, with  minor abnormalities described in the report. FINDINGS  Left Ventricle: Left ventricular ejection fraction, by estimation, is 60 to 65%. The left ventricle has normal function. The left ventricle has no regional wall motion abnormalities. The left ventricular internal cavity size was normal in size. There is  no left ventricular hypertrophy. Left ventricular diastolic parameters are indeterminate. Right Ventricle: The right ventricular size is normal. No increase in right ventricular wall thickness. Right ventricular systolic function is normal. There is normal pulmonary artery systolic pressure. The tricuspid regurgitant velocity is 2.59 m/s, and  with an assumed right atrial pressure of 3 mmHg, the estimated right ventricular systolic pressure is 23.5 mmHg. Left Atrium: Left atrial size was severely dilated. Right Atrium: Right atrial size was mild to moderately dilated. Pericardium: A small pericardial effusion is present. The pericardial effusion is circumferential. Mitral Valve: The mitral valve is normal in structure. Mild mitral annular calcification. Mild mitral valve regurgitation. No evidence of mitral valve stenosis. Tricuspid Valve: The tricuspid valve is normal in structure. Tricuspid valve regurgitation is moderate . No evidence of tricuspid stenosis. Aortic Valve: The aortic valve is tricuspid. Aortic valve regurgitation is mild. Aortic regurgitation PHT measures 522 msec. Mild aortic valve sclerosis is present, with no evidence of aortic valve stenosis. Pulmonic Valve: The pulmonic valve was grossly normal. Pulmonic valve regurgitation is trivial. Aorta: The aortic root, ascending aorta, aortic arch and descending aorta are all structurally normal, with no evidence of dilitation or obstruction. Venous: The inferior vena cava is normal in size with greater than 50% respiratory variability, suggesting right atrial pressure of 3 mmHg. IAS/Shunts: No atrial level shunt detected by color flow Doppler.   LEFT VENTRICLE PLAX 2D LVIDd:         4.10 cm  Diastology LVIDs:         2.30 cm  LV e' lateral:   9.14 cm/s LV PW:         0.80 cm  LV E/e' lateral: 10.9 LV IVS:        0.70 cm  LV e' medial:    8.49 cm/s LVOT diam:     1.50 cm  LV E/e' medial:  11.8 LV SV:         40 LV SV Index:   28 LVOT Area:     1.77 cm  RIGHT VENTRICLE  IVC RV S prime:     10.90 cm/s  IVC diam: 1.70 cm TAPSE (M-mode): 1.6 cm LEFT ATRIUM             Index       RIGHT ATRIUM           Index LA diam:        3.80 cm 2.62 cm/m  RA Area:     15.20 cm LA Vol (A2C):   56.2 ml 38.82 ml/m RA Volume:   34.20 ml  23.62 ml/m LA Vol (A4C):   58.0 ml 40.06 ml/m LA Biplane Vol: 58.1 ml 40.13 ml/m  AORTIC VALVE LVOT Vmax:   104.00 cm/s LVOT Vmean:  71.700 cm/s LVOT VTI:    0.226 m AI PHT:      522 msec  AORTA Ao Root diam: 3.40 cm Ao Asc diam:  3.70 cm MITRAL VALVE               TRICUSPID VALVE MV Area (PHT): 3.72 cm    TR Peak grad:   26.8 mmHg MV Decel Time: 204 msec    TR Vmax:        259.00 cm/s MV E velocity: 99.80 cm/s MV A velocity: 34.90 cm/s  SHUNTS MV E/A ratio:  2.86        Systemic VTI:  0.23 m                            Systemic Diam: 1.50 cm Buford Dresser MD Electronically signed by Buford Dresser MD Signature Date/Time: 03/04/2020/12:21:08 PM    Final     Cardiac Studies   Echo performed today: 1. Left ventricular ejection fraction, by estimation, is 60 to 65%. The  left ventricle has normal function. The left ventricle has no regional  wall motion abnormalities. Left ventricular diastolic parameters are  indeterminate.  2. Right ventricular systolic function is normal. The right ventricular  size is normal. There is normal pulmonary artery systolic pressure.  3. Left atrial size was severely dilated.  4. Right atrial size was mild to moderately dilated.  5. The mitral valve is normal in structure. Mild mitral valve  regurgitation. No evidence of mitral stenosis.  6. Tricuspid valve  regurgitation is moderate.  7. The aortic valve is tricuspid. Aortic valve regurgitation is mild.  Mild aortic valve sclerosis is present, with no evidence of aortic valve  stenosis.  8. The inferior vena cava is normal in size with greater than 50%  respiratory variability, suggesting right atrial pressure of 3 mmHg.   Patient Profile     86 y.o. female with paroxysmal atrial fibrillation, non-obstructive CAD, stage IIb lung cancer, prior pericarditis with pericardial effusion, hyperlipidemia and GERD admitted with syncope  Assessment & Plan    Syncope: -no pauses or ventricular rhythms on telemetry -echo unchanged -atrial fibrillation can go fast, but hasn't been so fast that I would expect a low perfusing rhythm given normal EF -may be reflex syncope or noncardiac cause  Atrial fibrillation, paroxymsal with atrial flutter with RVR -CHA2DS2/VAS Stroke Risk Points= at least 4 -anticoagulation with warfarin -cardioverted in ER, but return to afib. Had brief spontaneous conversion to SR over night, but now back in afib -rate controlled on drip -with normal EF, will transition back to oral cardizem. Will increase home dose to 180 mg daily (was 120), which is equivalent to her current dilt drip rate. Would gradually wean diltiazem drip after oral  is started, but can be on up to 7.5 mg/hr IV for rate control even with this dose of oral diltiazem -may need to consider antiarrhythmic if rate is difficult to control  Hypertension: well controlled currently  Nonobstructive CAD: no chest pain  Not addressed this admission: -stage 2b lung cancer  For questions or updates, please contact [place] Please consult [email or web address] for contact info under     Signed, Buford Dresser, MD  03/04/2020, 1:06 PM

## 2020-03-04 NOTE — Care Management Obs Status (Signed)
[facility] NOTIFICATION   Patient Details  Name: MERION CATON MRN: [id number] Date of Birth: May 11, 1932   Medicare Observation Status Notification Given:  Yes    Claudie Leach, RN 03/04/2020, 6:03 PM

## 2020-03-05 DIAGNOSIS — I480 Paroxysmal atrial fibrillation: Secondary | ICD-10-CM | POA: Diagnosis present

## 2020-03-05 DIAGNOSIS — Z7983 Long term (current) use of bisphosphonates: Secondary | ICD-10-CM | POA: Diagnosis not present

## 2020-03-05 DIAGNOSIS — Z7901 Long term (current) use of anticoagulants: Secondary | ICD-10-CM | POA: Diagnosis not present

## 2020-03-05 DIAGNOSIS — I2510 Atherosclerotic heart disease of native coronary artery without angina pectoris: Secondary | ICD-10-CM | POA: Diagnosis present

## 2020-03-05 DIAGNOSIS — R079 Chest pain, unspecified: Secondary | ICD-10-CM | POA: Diagnosis present

## 2020-03-05 DIAGNOSIS — I313 Pericardial effusion (noninflammatory): Secondary | ICD-10-CM | POA: Diagnosis present

## 2020-03-05 DIAGNOSIS — Z91010 Allergy to peanuts: Secondary | ICD-10-CM | POA: Diagnosis not present

## 2020-03-05 DIAGNOSIS — Z8249 Family history of ischemic heart disease and other diseases of the circulatory system: Secondary | ICD-10-CM | POA: Diagnosis not present

## 2020-03-05 DIAGNOSIS — W19XXXA Unspecified fall, initial encounter: Secondary | ICD-10-CM | POA: Diagnosis present

## 2020-03-05 DIAGNOSIS — E785 Hyperlipidemia, unspecified: Secondary | ICD-10-CM | POA: Diagnosis present

## 2020-03-05 DIAGNOSIS — I4892 Unspecified atrial flutter: Secondary | ICD-10-CM | POA: Diagnosis present

## 2020-03-05 DIAGNOSIS — Z8711 Personal history of peptic ulcer disease: Secondary | ICD-10-CM | POA: Diagnosis not present

## 2020-03-05 DIAGNOSIS — K219 Gastro-esophageal reflux disease without esophagitis: Secondary | ICD-10-CM | POA: Diagnosis present

## 2020-03-05 DIAGNOSIS — C3490 Malignant neoplasm of unspecified part of unspecified bronchus or lung: Secondary | ICD-10-CM | POA: Diagnosis present

## 2020-03-05 DIAGNOSIS — Z20822 Contact with and (suspected) exposure to covid-19: Secondary | ICD-10-CM | POA: Diagnosis present

## 2020-03-05 DIAGNOSIS — M810 Age-related osteoporosis without current pathological fracture: Secondary | ICD-10-CM | POA: Diagnosis present

## 2020-03-05 DIAGNOSIS — R55 Syncope and collapse: Secondary | ICD-10-CM | POA: Diagnosis present

## 2020-03-05 DIAGNOSIS — Z79899 Other long term (current) drug therapy: Secondary | ICD-10-CM | POA: Diagnosis not present

## 2020-03-05 DIAGNOSIS — I083 Combined rheumatic disorders of mitral, aortic and tricuspid valves: Secondary | ICD-10-CM | POA: Diagnosis present

## 2020-03-05 DIAGNOSIS — Z9049 Acquired absence of other specified parts of digestive tract: Secondary | ICD-10-CM | POA: Diagnosis not present

## 2020-03-05 DIAGNOSIS — I10 Essential (primary) hypertension: Secondary | ICD-10-CM | POA: Diagnosis present

## 2020-03-05 LAB — CBC
HCT: 41.1 % (ref 36.0–46.0)
Hemoglobin: 13.2 g/dL (ref 12.0–15.0)
MCH: 29.7 pg (ref 26.0–34.0)
MCHC: 32.1 g/dL (ref 30.0–36.0)
MCV: 92.6 fL (ref 80.0–100.0)
Platelets: 196 10*3/uL (ref 150–400)
RBC: 4.44 MIL/uL (ref 3.87–5.11)
RDW: 14.2 % (ref 11.5–15.5)
WBC: 3.9 10*3/uL — ABNORMAL LOW (ref 4.0–10.5)
nRBC: 0 % (ref 0.0–0.2)

## 2020-03-05 MED ORDER — AMIODARONE LOAD VIA INFUSION
150.0000 mg | Freq: Once | INTRAVENOUS | Status: AC
  Administered 2020-03-05: 150 mg via INTRAVENOUS
  Filled 2020-03-05: qty 83.34

## 2020-03-05 MED ORDER — AMIODARONE HCL IN DEXTROSE 360-4.14 MG/200ML-% IV SOLN
30.0000 mg/h | INTRAVENOUS | Status: DC
  Administered 2020-03-05: 30 mg/h via INTRAVENOUS
  Filled 2020-03-05 (×2): qty 200

## 2020-03-05 MED ORDER — AMIODARONE HCL IN DEXTROSE 360-4.14 MG/200ML-% IV SOLN
60.0000 mg/h | INTRAVENOUS | Status: AC
  Administered 2020-03-05: 60 mg/h via INTRAVENOUS
  Filled 2020-03-05: qty 200

## 2020-03-05 MED ORDER — AMIODARONE HCL 200 MG PO TABS
200.0000 mg | ORAL_TABLET | Freq: Two times a day (BID) | ORAL | Status: DC
  Administered 2020-03-05 – 2020-03-06 (×2): 200 mg via ORAL
  Filled 2020-03-05 (×2): qty 1

## 2020-03-05 MED ORDER — AMIODARONE HCL 200 MG PO TABS: 200.0000 mg | ORAL_TABLET | Freq: Every day | ORAL | Status: DC

## 2020-03-05 NOTE — Progress Notes (Signed)
[place] for warfarin Indication: atrial fibrillation  Allergies  Allergen Reactions  . Peanut-Containing Drug Products Rash    Patient Measurements: Height: 5\' 2"  (157.5 cm) Weight: 47.8 kg (105 lb 4.8 oz) IBW/kg (Calculated) : 50.1  Vital Signs: Temp: 98 F (36.7 C) (09/05 0700) Temp Source: Oral (09/05 0420) BP: 127/91 (09/05 0700) Pulse Rate: 90 (09/05 0700)  Labs: Recent Labs    03/03/20 1601 03/03/20 1601 03/03/20 2038 03/03/20 2204 03/04/20 0125 03/05/20 0540  HGB 13.8   < >  --   --  13.2 13.2  HCT 42.4  --   --   --  41.0 41.1  PLT 191  --   --   --  181 196  LABPROT 24.1*  --  23.0*  --  23.8*  --   INR 2.3*  --  2.1*  --  2.2*  --   CREATININE 0.93  --   --   --  0.90  --   TROPONINIHS <2   < > 4 4 5   --    < > = values in this interval not displayed.    Estimated Creatinine Clearance: 33.9 mL/min (by C-G formula based on SCr of 0.9 mg/dL).   Medical History: Past Medical History:  Diagnosis Date  . Coronary artery disease     Medications:  Infusions:  . amiodarone     Followed by  . amiodarone    . diltiazem (CARDIZEM) infusion Stopped (03/04/20 1511)    Assessment: 88yoF on warfarin PTA s/p cardioversion in the ED for afib w/ RVR. Warfarin PTA dose is 10mg  on Tuesdays and 7.5mg  on all other days. INR therapeutic on admission at 2.3 and continues to be therapeutic during hospital stay.   Patient started on amiodarone which can increase the anticoagulant effects of warfarin but may not be seen for days/weeks. Will continue to closely monitor INR in the hospital and will require close follow up as an outpatient. LFTs and CBC stable, no bleeding noted.   Goal of Therapy:  INR 2-3 Monitor platelets by anticoagulation protocol: Yes   Plan:  Continue warfarin 10mg  on Tuesdays and 7.5mg  on all other days Daily INR, monitor CBC  Romilda Garret, PharmD PGY1 Acute Care Pharmacy Resident Phone:  [phone] 03/05/2020 10:47 AM  Please check [email or web address] for unit specific pharmacy phone numbers.

## 2020-03-05 NOTE — Progress Notes (Signed)
  Amiodarone Drug - Drug Interaction Consult Note  Recommendations: Significant drug interactions were identified. Close monitoring is recommended as follows:  - Monitor INR closely while on warfarin and amiodarone. Increased INR may not be seen for days to weeks.  - Monitor for increased myopathy while on amiodarone and atorvastatin.  - Monitor for increased risk of bradycardia while on amiodarone and diltiazem  Amiodarone is metabolized by the cytochrome P450 system and therefore has the potential to cause many drug interactions. Amiodarone has an average plasma half-life of 50 days (range 20 to 100 days).   There is potential for drug interactions to occur several weeks or months after stopping treatment and the onset of drug interactions may be slow after initiating amiodarone.   [x]  Statins: Increased risk of myopathy. Simvastatin- restrict dose to 20mg  daily. Other statins: counsel patients to report any muscle pain or weakness immediately.  [x]  Anticoagulants: Amiodarone can increase anticoagulant effect. Consider warfarin dose reduction. Patients should be monitored closely and the dose of anticoagulant altered accordingly, remembering that amiodarone levels take several weeks to stabilize.  []  Antiepileptics: Amiodarone can increase plasma concentration of phenytoin, the dose should be reduced. Note that small changes in phenytoin dose can result in large changes in levels. Monitor patient and counsel on signs of toxicity.  []  Beta blockers: increased risk of bradycardia, AV block and myocardial depression. Sotalol - avoid concomitant use.  [x]   Calcium channel blockers (diltiazem and verapamil): increased risk of bradycardia, AV block and myocardial depression.  []   Cyclosporine: Amiodarone increases levels of cyclosporine. Reduced dose of cyclosporine is recommended.  []  Digoxin dose should be halved when amiodarone is started.  []  Diuretics: increased risk of cardiotoxicity if  hypokalemia occurs.  []  Oral hypoglycemic agents (glyburide, glipizide, glimepiride): increased risk of hypoglycemia. Patient's glucose levels should be monitored closely when initiating amiodarone therapy.   []  Drugs that prolong the QT interval:  Torsades de pointes risk may be increased with concurrent use - avoid if possible.  Monitor QTc, also keep magnesium/potassium WNL if concurrent therapy can't be avoided. [name] Antibiotics: e.g. fluoroquinolones, erythromycin. . Antiarrhythmics: e.g. quinidine, procainamide, disopyramide, sotalol. . Antipsychotics: e.g. phenothiazines, haloperidol.  . Lithium, tricyclic antidepressants, and methadone. Thank You,   Romilda Garret, PharmD PGY1 Acute Care Pharmacy Resident Phone: [phone] 03/05/2020 10:43 AM  Please check [email or web address] for unit specific pharmacy phone numbers.

## 2020-03-05 NOTE — Progress Notes (Signed)
Progress Note  Patient Name: Cynthia Vargas Date of Encounter: 03/05/2020  [facility] Cardiologist: Fransico Him, MD   Subjective   Continues to feel tired, hasn't been able to do more than sit on the edge of the bed. HR in computer inaccurate, based on telemetry has been 95-150 bpm over last 24 hours. Spikes to 130 bpm range just while speaking with her. We discussed options for management at length, see below.  Inpatient Medications    Scheduled Meds: . amiodarone  150 mg Intravenous Once  . amiodarone  200 mg Oral Q12H   Followed by  . [START ON 03/13/2020] amiodarone  200 mg Oral Daily  . atorvastatin  20 mg Oral Daily  . diltiazem  180 mg Oral Daily  . gatifloxacin  1 drop Right Eye QID  . ketorolac  1 drop Right Eye QID  . prednisoLONE acetate  1 drop Right Eye QID  . [START ON 03/07/2020] warfarin  10 mg Oral Once per day on Tue  . warfarin  7.5 mg Oral Once per day on Sun Mon Wed Thu Fri Sat  . Warfarin - Pharmacist Dosing Inpatient   Does not apply q1600   Continuous Infusions: . amiodarone     Followed by  . amiodarone    . diltiazem (CARDIZEM) infusion Stopped (03/04/20 1511)   PRN Meds: acetaminophen, ondansetron (ZOFRAN) IV   Vital Signs    Vitals:   03/04/20 2100 03/05/20 0300 03/05/20 0420 03/05/20 0700  BP: 114/79 135/85 124/86 (!) 127/91  Pulse: 93 90  90  Resp: 16 19 18 17   Temp: 99.6 F (37.6 C) 98.2 F (36.8 C) 98.2 F (36.8 C) 98 F (36.7 C)  TempSrc: Oral  Oral   SpO2: 94% 91% 94% 91%  Weight:  47.8 kg    Height:        Intake/Output Summary (Last 24 hours) at 03/05/2020 0945 Last data filed at 03/05/2020 0413 Gross per 24 hour  Intake 240 ml  Output 1800 ml  Net -1560 ml   Last 3 Weights 03/05/2020 03/04/2020 03/03/2020  Weight (lbs) 105 lb 4.8 oz 104 lb 102 lb  Weight (kg) 47.764 kg 47.174 kg 46.267 kg      Telemetry    Atrial fibrillation, with heart rate spikes to 150s.- Personally Reviewed  ECG    9/3 post cardioversion,  SR at 73 bpm. ECG on presentation appears to be atypical atrial flutter with variable conduction- Personally Reviewed  Physical Exam   GEN: Well nourished, well developed in no acute distress NECK: No JVD CARDIAC: irregularly irregular rhythm, normal S1 and S2, no rubs or gallops. No murmur. VASCULAR: Radial pulses 2+ bilaterally.  RESPIRATORY:  Clear to auscultation without rales, wheezing or rhonchi  ABDOMEN: Soft, non-tender, non-distended MUSCULOSKELETAL:  Moves all 4 limbs independently SKIN: Warm and dry, no edema NEUROLOGIC:  No focal neuro deficits noted. PSYCHIATRIC:  Normal affect   Labs    High Sensitivity Troponin:   Recent Labs  Lab 03/03/20 1601 03/03/20 2038 03/03/20 2204 03/04/20 0125  TROPONINIHS 2 4 4 5       Chemistry Recent Labs  Lab 03/03/20 1601 03/04/20 0125  NA 131* 136  K 4.7 4.0  CL 101 101  CO2 19* 26  GLUCOSE 101* 116*  BUN 14 10  CREATININE 0.93 0.90  CALCIUM 8.8* 8.9  PROT 6.7  --   ALBUMIN 4.1  --   AST 30  --   ALT 19  --  ALKPHOS 45  --   BILITOT 0.7  --   GFRNONAA 56* 58*  GFRAA >60 >60  ANIONGAP 11 9     Hematology Recent Labs  Lab 03/03/20 1601 03/04/20 0125 03/05/20 0540  WBC 5.7 4.6 3.9*  RBC 4.50 4.41 4.44  HGB 13.8 13.2 13.2  HCT 42.4 41.0 41.1  MCV 94.2 93.0 92.6  MCH 30.7 29.9 29.7  MCHC 32.5 32.2 32.1  RDW 14.2 14.2 14.2  PLT 191 181 196    BNP Recent Labs  Lab 03/03/20 1601  BNP 479.8*     DDimer No results for input(s): DDIMER in the last 168 hours.   Radiology    DG Chest 1 View  Result Date: 03/03/2020 CLINICAL DATA:  Chest pain and weakness X 1 month, syncopal event. EXAM: CHEST  1 VIEW COMPARISON:  Chest x-ray 09/29/2015, CT chest 01/14/2018 next FINDINGS: Tubes and medical apparatus overlying the right hemithorax. The heart size and mediastinal contours are unchanged. Redemonstration of a aortic arch calcification. Curvilinear well-defined edge projecting over the lateral apical aspect  of the right lung with suggestion of vascular markings extending beyond that line superolaterally. Otherwise both lungs are clear. The visualized skeletal structures are unremarkable. Surgical clips overlie the right hemithorax. IMPRESSION: Curvilinear well-defined edge projecting over the lateral apical aspect of the right lung likely represents a skin fold considering vascular markings extend beyond that line. If high clinical suspicion for pneumothorax, consider repeating chest x-ray for re-evaluation. No active cardiopulmonary disease. Electronically Signed   By: Iven Finn M.D.   On: 03/03/2020 16:19   CT Head Wo Contrast  Result Date: 03/03/2020 CLINICAL DATA:  Head trauma, minor. Spine fracture, cervical, traumatic. EXAM: CT HEAD WITHOUT CONTRAST CT CERVICAL SPINE WITHOUT CONTRAST TECHNIQUE: Multidetector CT imaging of the head and cervical spine was performed following the standard protocol without intravenous contrast. Multiplanar CT image reconstructions of the cervical spine were also generated. COMPARISON:  Head CT 03/30/2013. FINDINGS: CT HEAD FINDINGS Brain: Moderate generalized parenchymal atrophy. Advanced ill-defined hypoattenuation within the cerebral white matter which is nonspecific, but consistent with chronic small vessel ischemic disease. These findings have progressed as compared to the prior head CT of 03/29/2013. Additionally, a tiny chronic lacunar infarct within the right basal ganglia is new as compared to this prior exam. There is no acute intracranial hemorrhage. No demarcated cortical infarct. No extra-axial fluid collection. No evidence of intracranial mass. No midline shift. Vascular: No hyperdense vessel.  Atherosclerotic calcifications Skull: Normal. Negative for fracture or focal lesion. Sinuses/Orbits: Visualized orbits show no acute finding. Mild ethmoid sinus mucosal thickening. No significant mastoid effusion. CT CERVICAL SPINE FINDINGS Mildly motion degraded  examination. Alignment: Rightward rotation of C1 upon C2 may be related to patient head positioning at the time of examination trace C4-C5 grade 1 retrolisthesis. Trace C6-C7 grade 1 anterolisthesis. Skull base and vertebrae: The basion-dental and atlanto-dental intervals are maintained.No evidence of acute fracture to the cervical spine. Soft tissues and spinal canal: No prevertebral fluid or swelling. No visible canal hematoma. Disc levels: Will spondylosis with multilevel disc space narrowing, posterior disc osteophytes, uncovertebral and facet hypertrophy. No high-grade bony spinal canal narrowing. Multilevel bony neural foraminal narrowing. Upper chest: No consolidation within the imaged lung apices. No visible pneumothorax. Other: Calcified atherosclerotic plaque within the visualized aortic arch, proximal major branch vessels of the neck and at carotid bifurcations. IMPRESSION: CT head: 1. No evidence of acute intracranial abnormality. 2. Moderate generalized parenchymal atrophy with advanced cerebral white matter chronic  small vessel ischemic disease. These findings have progressed as compared to the head CT of 03/29/2013. 3. Tiny chronic right basal ganglia lacunar infarct, new as compared to this prior exam. 4. Mild ethmoid sinus mucosal thickening. CT cervical spine: 1. Mildly motion degraded examination. 2. No evidence of acute fracture to the cervical spine. 3. Rightward rotation of C1 upon C2 which may be related to patient head positioning at the time of examination. Clinical correlation is recommended. 4. Trace C4-C5 grade 1 retrolisthesis and C6-C7 grade 1 anterolisthesis. 5. Cervical spondylosis as described. Electronically Signed   By: Kellie Simmering DO   On: 03/03/2020 17:26   CT Cervical Spine Wo Contrast  Result Date: 03/03/2020 CLINICAL DATA:  Head trauma, minor. Spine fracture, cervical, traumatic. EXAM: CT HEAD WITHOUT CONTRAST CT CERVICAL SPINE WITHOUT CONTRAST TECHNIQUE: Multidetector CT  imaging of the head and cervical spine was performed following the standard protocol without intravenous contrast. Multiplanar CT image reconstructions of the cervical spine were also generated. COMPARISON:  Head CT 03/30/2013. FINDINGS: CT HEAD FINDINGS Brain: Moderate generalized parenchymal atrophy. Advanced ill-defined hypoattenuation within the cerebral white matter which is nonspecific, but consistent with chronic small vessel ischemic disease. These findings have progressed as compared to the prior head CT of 03/29/2013. Additionally, a tiny chronic lacunar infarct within the right basal ganglia is new as compared to this prior exam. There is no acute intracranial hemorrhage. No demarcated cortical infarct. No extra-axial fluid collection. No evidence of intracranial mass. No midline shift. Vascular: No hyperdense vessel.  Atherosclerotic calcifications Skull: Normal. Negative for fracture or focal lesion. Sinuses/Orbits: Visualized orbits show no acute finding. Mild ethmoid sinus mucosal thickening. No significant mastoid effusion. CT CERVICAL SPINE FINDINGS Mildly motion degraded examination. Alignment: Rightward rotation of C1 upon C2 may be related to patient head positioning at the time of examination trace C4-C5 grade 1 retrolisthesis. Trace C6-C7 grade 1 anterolisthesis. Skull base and vertebrae: The basion-dental and atlanto-dental intervals are maintained.No evidence of acute fracture to the cervical spine. Soft tissues and spinal canal: No prevertebral fluid or swelling. No visible canal hematoma. Disc levels: Will spondylosis with multilevel disc space narrowing, posterior disc osteophytes, uncovertebral and facet hypertrophy. No high-grade bony spinal canal narrowing. Multilevel bony neural foraminal narrowing. Upper chest: No consolidation within the imaged lung apices. No visible pneumothorax. Other: Calcified atherosclerotic plaque within the visualized aortic arch, proximal major branch  vessels of the neck and at carotid bifurcations. IMPRESSION: CT head: 1. No evidence of acute intracranial abnormality. 2. Moderate generalized parenchymal atrophy with advanced cerebral white matter chronic small vessel ischemic disease. These findings have progressed as compared to the head CT of 03/29/2013. 3. Tiny chronic right basal ganglia lacunar infarct, new as compared to this prior exam. 4. Mild ethmoid sinus mucosal thickening. CT cervical spine: 1. Mildly motion degraded examination. 2. No evidence of acute fracture to the cervical spine. 3. Rightward rotation of C1 upon C2 which may be related to patient head positioning at the time of examination. Clinical correlation is recommended. 4. Trace C4-C5 grade 1 retrolisthesis and C6-C7 grade 1 anterolisthesis. 5. Cervical spondylosis as described. Electronically Signed   By: Kellie Simmering DO   On: 03/03/2020 17:26   ECHOCARDIOGRAM COMPLETE  Result Date: 03/04/2020    ECHOCARDIOGRAM REPORT   Patient Name:   Cynthia Vargas [facility] Date of Exam: 03/04/2020 Medical Rec #:  [id number]          Height:       62.0 in Accession #:  [id number]         Weight:       104.0 lb Date of Birth:  11-Jul-1931          BSA:          1.448 m Patient Age:    29 years           BP:           125/92 mmHg Patient Gender: F                  HR:           68 bpm. Exam Location:  Inpatient Procedure: 2D Echo Indications:    syncope 780.2  History:        Patient has no prior history of Echocardiogram examinations.                 Signs/Symptoms:Syncope.  Sonographer:    Johny Chess Referring Phys: [id number] [place]  1. Left ventricular ejection fraction, by estimation, is 60 to 65%. The left ventricle has normal function. The left ventricle has no regional wall motion abnormalities. Left ventricular diastolic parameters are indeterminate.  2. Right ventricular systolic function is normal. The right ventricular size is normal. There is normal pulmonary  artery systolic pressure.  3. Left atrial size was severely dilated.  4. Right atrial size was mild to moderately dilated.  5. The mitral valve is normal in structure. Mild mitral valve regurgitation. No evidence of mitral stenosis.  6. Tricuspid valve regurgitation is moderate.  7. The aortic valve is tricuspid. Aortic valve regurgitation is mild. Mild aortic valve sclerosis is present, with no evidence of aortic valve stenosis.  8. The inferior vena cava is normal in size with greater than 50% respiratory variability, suggesting right atrial pressure of 3 mmHg. Comparison(s): No significant change from prior study. Conclusion(s)/Recommendation(s): Otherwise normal echocardiogram, with minor abnormalities described in the report. FINDINGS  Left Ventricle: Left ventricular ejection fraction, by estimation, is 60 to 65%. The left ventricle has normal function. The left ventricle has no regional wall motion abnormalities. The left ventricular internal cavity size was normal in size. There is  no left ventricular hypertrophy. Left ventricular diastolic parameters are indeterminate. Right Ventricle: The right ventricular size is normal. No increase in right ventricular wall thickness. Right ventricular systolic function is normal. There is normal pulmonary artery systolic pressure. The tricuspid regurgitant velocity is 2.59 m/s, and  with an assumed right atrial pressure of 3 mmHg, the estimated right ventricular systolic pressure is 46.2 mmHg. Left Atrium: Left atrial size was severely dilated. Right Atrium: Right atrial size was mild to moderately dilated. Pericardium: A small pericardial effusion is present. The pericardial effusion is circumferential. Mitral Valve: The mitral valve is normal in structure. Mild mitral annular calcification. Mild mitral valve regurgitation. No evidence of mitral valve stenosis. Tricuspid Valve: The tricuspid valve is normal in structure. Tricuspid valve regurgitation is moderate . No  evidence of tricuspid stenosis. Aortic Valve: The aortic valve is tricuspid. Aortic valve regurgitation is mild. Aortic regurgitation PHT measures 522 msec. Mild aortic valve sclerosis is present, with no evidence of aortic valve stenosis. Pulmonic Valve: The pulmonic valve was grossly normal. Pulmonic valve regurgitation is trivial. Aorta: The aortic root, ascending aorta, aortic arch and descending aorta are all structurally normal, with no evidence of dilitation or obstruction. Venous: The inferior vena cava is normal in size with greater than 50% respiratory variability, suggesting right atrial pressure of 3 mmHg.  IAS/Shunts: No atrial level shunt detected by color flow Doppler.  LEFT VENTRICLE PLAX 2D LVIDd:         4.10 cm  Diastology LVIDs:         2.30 cm  LV e' lateral:   9.14 cm/s LV PW:         0.80 cm  LV E/e' lateral: 10.9 LV IVS:        0.70 cm  LV e' medial:    8.49 cm/s LVOT diam:     1.50 cm  LV E/e' medial:  11.8 LV SV:         40 LV SV Index:   28 LVOT Area:     1.77 cm  RIGHT VENTRICLE             IVC RV S prime:     10.90 cm/s  IVC diam: 1.70 cm TAPSE (M-mode): 1.6 cm LEFT ATRIUM             Index       RIGHT ATRIUM           Index LA diam:        3.80 cm 2.62 cm/m  RA Area:     15.20 cm LA Vol (A2C):   56.2 ml 38.82 ml/m RA Volume:   34.20 ml  23.62 ml/m LA Vol (A4C):   58.0 ml 40.06 ml/m LA Biplane Vol: 58.1 ml 40.13 ml/m  AORTIC VALVE LVOT Vmax:   104.00 cm/s LVOT Vmean:  71.700 cm/s LVOT VTI:    0.226 m AI PHT:      522 msec  AORTA Ao Root diam: 3.40 cm Ao Asc diam:  3.70 cm MITRAL VALVE               TRICUSPID VALVE MV Area (PHT): 3.72 cm    TR Peak grad:   26.8 mmHg MV Decel Time: 204 msec    TR Vmax:        259.00 cm/s MV E velocity: 99.80 cm/s MV A velocity: 34.90 cm/s  SHUNTS MV E/A ratio:  2.86        Systemic VTI:  0.23 m                            Systemic Diam: 1.50 cm Buford Dresser MD Electronically signed by Buford Dresser MD Signature Date/Time:  03/04/2020/12:21:08 PM    Final     Cardiac Studies   Echo 03/04/20: 1. Left ventricular ejection fraction, by estimation, is 60 to 65%. The  left ventricle has normal function. The left ventricle has no regional  wall motion abnormalities. Left ventricular diastolic parameters are  indeterminate.  2. Right ventricular systolic function is normal. The right ventricular  size is normal. There is normal pulmonary artery systolic pressure.  3. Left atrial size was severely dilated.  4. Right atrial size was mild to moderately dilated.  5. The mitral valve is normal in structure. Mild mitral valve  regurgitation. No evidence of mitral stenosis.  6. Tricuspid valve regurgitation is moderate.  7. The aortic valve is tricuspid. Aortic valve regurgitation is mild.  Mild aortic valve sclerosis is present, with no evidence of aortic valve  stenosis.  8. The inferior vena cava is normal in size with greater than 50%  respiratory variability, suggesting right atrial pressure of 3 mmHg.   Patient Profile     86 y.o. female with paroxysmal atrial fibrillation, non-obstructive CAD, stage  IIb lung cancer, prior pericarditis with pericardial effusion, hyperlipidemia and GERD admitted with syncope  Assessment & Plan    Syncope: -no pauses or ventricular rhythms on telemetry -echo unchanged -her atrial fibrillation can be in the 150-160 bpm range based on her presentation, but hasn't been so fast that I would expect a low perfusing rhythm given normal EF. However, she endorses feeling weak, dizzy, exhausted with her afib, so while it may not be a direct cardiac cause, could be influenced by her afib  Atrial fibrillation, paroxymsal with atrial flutter with RVR -CHA2DS2/VAS Stroke Risk Points= at least 4 -anticoagulation with warfarin. INR 2.2 -cardioverted in ER, but return to afib.  -we discussed options for management at length today. Despite increased dose of diltiazem, even with drip, she  intermittently goes into fast RVR. She feels very fatigued with her afib -we discussed antiarrhythmics for rhythm control vs. Rate control. She wishes to pursue rhythm control. We spoke about several options, focused on tikosyn and amiodarone after discussion. After shared decision making, she would like to proceed with amiodarone -will give IV load for 24 hours, then load orally -will likely need adjustment to coumadin dose with initiation of amiodarone -continue diltiazem while loading, may be able to stop once in sinus rhythm -if she does not convert to sinus with amio alone, would recommend cardioversion on 9/7. She was emergently cardioverted in ER, and she has not been low INR since charted. However, would clear with Dr. Audie Box as he will be performing the procedure.  Hypertension: well controlled currently  Nonobstructive CAD: no chest pain  Not addressed this admission: -stage 2b lung cancer  Disposition: given her severe symptomatic afib with RVR, expect she will need to remain inpatient until sinus rhythm restored, either with amiodarone or cardioversion.  Total time of encounter: 40 minutes total time of encounter, including 35 minutes spent in face-to-face patient care. This time includes coordination of care and counseling regarding options for management of atrial fibrillation. Remainder of non-face-to-face time involved reviewing chart documents/testing relevant to the patient encounter and documentation in the medical record.  Buford Dresser, MD, PhD [facility]   For questions or updates, please contact [place] Please consult [email or web address] for contact info under     Signed, Buford Dresser, MD  03/05/2020, 9:45 AM

## 2020-03-06 ENCOUNTER — Other Ambulatory Visit: Payer: Self-pay | Admitting: Physician Assistant

## 2020-03-06 LAB — BASIC METABOLIC PANEL
Anion gap: 12 (ref 5–15)
BUN: 23 mg/dL (ref 8–23)
CO2: 20 mmol/L — ABNORMAL LOW (ref 22–32)
Calcium: 8.7 mg/dL — ABNORMAL LOW (ref 8.9–10.3)
Chloride: 97 mmol/L — ABNORMAL LOW (ref 98–111)
Creatinine, Ser: 1.23 mg/dL — ABNORMAL HIGH (ref 0.44–1.00)
GFR calc Af Amer: 46 mL/min — ABNORMAL LOW (ref >60)
GFR calc non Af Amer: 40 mL/min — ABNORMAL LOW (ref >60)
Glucose, Bld: 90 mg/dL (ref 70–99)
Potassium: 4.3 mmol/L (ref 3.5–5.1)
Sodium: 129 mmol/L — ABNORMAL LOW (ref 135–145)

## 2020-03-06 LAB — CBC
HCT: 41.4 % (ref 36.0–46.0)
Hemoglobin: 13.7 g/dL (ref 12.0–15.0)
MCH: 30.9 pg (ref 26.0–34.0)
MCHC: 33.1 g/dL (ref 30.0–36.0)
MCV: 93.5 fL (ref 80.0–100.0)
Platelets: 238 10*3/uL (ref 150–400)
RBC: 4.43 MIL/uL (ref 3.87–5.11)
RDW: 14.3 % (ref 11.5–15.5)
WBC: 5.6 10*3/uL (ref 4.0–10.5)
nRBC: 0 % (ref 0.0–0.2)

## 2020-03-06 LAB — PROTIME-INR
INR: 3 — ABNORMAL HIGH (ref 0.8–1.2)
Prothrombin Time: 30.2 seconds — ABNORMAL HIGH (ref 11.4–15.2)

## 2020-03-06 MED ORDER — DILTIAZEM HCL ER COATED BEADS 180 MG PO CP24: 180.0000 mg | ORAL_CAPSULE | Freq: Every day | ORAL | 6 refills | Status: DC

## 2020-03-06 MED ORDER — AMIODARONE HCL 200 MG PO TABS: 200.0000 mg | ORAL_TABLET | Freq: Two times a day (BID) | ORAL | 6 refills | Status: AC

## 2020-03-06 MED ORDER — AMIODARONE HCL 200 MG PO TABS: 200.0000 mg | ORAL_TABLET | Freq: Two times a day (BID) | ORAL | 6 refills | Status: DC

## 2020-03-06 NOTE — Discharge Summary (Signed)
Discharge Summary    Patient ID: Cynthia Vargas MRN: [id number]; DOB: 10/21/1931  Admit date: 03/03/2020 Discharge date: 03/06/2020  Primary Care Provider: Hoyt Koch, MD  Primary Cardiologist: Fransico Him, MD  Primary Electrophysiologist:  None   Discharge Diagnoses    Active Problems:   Syncope   Paroxysmal atrial fibrillation with RVR ([place])   Allergies Allergies  Allergen Reactions   Peanut-Containing Drug Products Rash    Diagnostic Studies/Procedures    ECHO: 03/04/2020 1. Left ventricular ejection fraction, by estimation, is 60 to 65%. The  left ventricle has normal function. The left ventricle has no regional  wall motion abnormalities. Left ventricular diastolic parameters are  indeterminate.  2. Right ventricular systolic function is normal. The right ventricular  size is normal. There is normal pulmonary artery systolic pressure.  3. Left atrial size was severely dilated.  4. Right atrial size was mild to moderately dilated.  5. The mitral valve is normal in structure. Mild mitral valve  regurgitation. No evidence of mitral stenosis.  6. Tricuspid valve regurgitation is moderate.  7. The aortic valve is tricuspid. Aortic valve regurgitation is mild.  Mild aortic valve sclerosis is present, with no evidence of aortic valve  stenosis.  8. The inferior vena cava is normal in size with greater than 50%  respiratory variability, suggesting right atrial pressure of 3 mmHg.   Procedure: Electrical Cardioversion 03/03/2020 Indications:  Atrial Flutter with RVR  Time Out: Verified patient identification, verified procedure,medications/allergies/relevent history reviewed, required imaging and test results available.  Performed  Procedure Details  The patient was NPO after midnight. Anesthesia was administered at the beside by Dr. Gerlene Fee with 5mg  of Etomidate.  Cardioversion was done with synchronized biphasic defibrillation with AP  pads with 150watts.  The patient converted to normal sinus rhythm. The patient tolerated the procedure well   IMPRESSION:  Successful cardioversion of atrial flutter  _____________   History of Present Illness     Cynthia Vargas is a 86 y.o. female with paroxysmal atrial fibrillation, non-obstructive CAD, stage IIb lung cancer, prior pericarditis with pericardial effusion, hyperlipidemia and GERD who was admitted 09/03 with syncope, rapid atrial fibrillation and chest pressure.  Hospital Course     Consultants: None   # Syncope:  It is unclear what caused Cynthia Vargas's syncope. She is in atrial fibrillation with poorly controlled heart rates. However it is unlikely that heart rates in the 120s because her syncope. She has a history of pericardial effusion but does not appear to be in tamponade at this time. She has no lower extremity edema and is laying flat in the bed comfortably. We will get an echocardiogram to assess for any changes in her systolic function or valvular heart disease. She appears as though she could be a little dry, however BNP is elevated to 500 so we will not give IV fluids.    She had no HR high or low enough to cause syncope. HR control is better now. If symptoms recur, consider outpatient monitor.  # PAF:  Rates remain uncontrolled on diltiazem infusion at 10 mg. I suspect she would feel better in sinus rhythm. INRs have been in range on each of her monthly check for several months. We will plan for cardioversion in the ED. Continue IV diltiazem for now. Would increase her home dose of oral diltiazem if her blood pressure allows.   CHA2DS2-VASc = 4 (age x 2, female, CAD).  Continue warfarin. Her INR was therapeutic on admission  and followed during her stay.   Cynthia Vargas was cardioverted in the ER >> SR. However, she went back into rapid atrial fib before arriving on the floor. Cardizem was uptitrated for better HR control. However, she had problems w/  bradycardia and the drip was discontinued. It was restarted since her HR was not well-controlled. The drip was converted to CD at 180 mg/day, a little higher than previous dose of 120 mg/day. Currently tolerating this.   Dr Harrell Gave reviewed options with her. She is very symptomatic with the atrial fib, even if the rate is controlled. She will benefit from maintaining SR, she was started on amiodarone, initially IV. She will complete the load as an outpatient. Continue diltiazem for now, may be able to stop it once in SR. Check LFTs, TFTs and PFTs as an outpatient. Schedule DCCV as an outpatient if she does not spontaneously convert to SR.   # Chest pressure:  She only has chest heaviness when laying down after doing strenuous activities. She has no discomfort with exertion. High-sensitivity troponin is within normal limits. She had a coronary CT-a in 2019 as above. Doubt this is ACS. We will work on getting her back into sinus rhythm and gentle diuresis. If she continues to have chest discomfort consider repeat ischemia evaluation at that time.  Echo results are above, her EF is normal w/ no WMA, +biatrial dilatation. Chest pain resolved with improvement in her HR. Troponin's were checked and were negative. No hx CAD. No further workup was indicated and she is considered stable for discharge, to follow up as an outpatient.   On 09/06, she was seen by Dr Harrell Gave and all data were reviewed. HR control was improved. She ambulated and her HR and symptoms remained stable. No further inpatient workup is indicated and she is considered stable for discharge, to follow up as an outpatient.  Did the patient have an acute coronary syndrome (MI, NSTEMI, STEMI, etc) this admission?:  No                               Did the patient have a percutaneous coronary intervention (stent / angioplasty)?:  No.   _____________  Discharge Vitals Blood pressure 129/75, pulse 73, temperature 97.9 F (36.6 C),  temperature source Oral, resp. rate 18, height 5\' 2"  (1.575 m), weight 48.2 kg, SpO2 100 %.  Filed Weights   03/04/20 0353 03/05/20 0300 03/06/20 0400  Weight: 47.2 kg 47.8 kg 48.2 kg    Labs & Radiologic Studies    CBC Recent Labs    03/05/20 0540 03/06/20 0443  WBC 3.9* 5.6  HGB 13.2 13.7  HCT 41.1 41.4  MCV 92.6 93.5  PLT 196 882   Basic Metabolic Panel Recent Labs    03/03/20 1601 03/03/20 1601 03/04/20 0125 03/06/20 0443  NA 131*   < > 136 129*  K 4.7   < > 4.0 4.3  CL 101   < > 101 97*  CO2 19*   < > 26 20*  GLUCOSE 101*   < > 116* 90  BUN 14   < > 10 23  CREATININE 0.93   < > 0.90 1.23*  CALCIUM 8.8*   < > 8.9 8.7*  MG 2.1  --   --   --    < > = values in this interval not displayed.   Liver Function Tests Recent Labs    03/03/20 1601  AST 30  ALT 19  ALKPHOS 45  BILITOT 0.7  PROT 6.7  ALBUMIN 4.1   No results for input(s): LIPASE, AMYLASE in the last 72 hours. High Sensitivity Troponin:   Recent Labs  Lab 03/03/20 1601 03/03/20 2038 03/03/20 2204 03/04/20 0125  TROPONINIHS 2 4 4 5     BNP Invalid input(s): POCBNP D-Dimer No results for input(s): DDIMER in the last 72 hours. Hemoglobin A1C No results for input(s): HGBA1C in the last 72 hours. Fasting Lipid Panel No results for input(s): CHOL, HDL, LDLCALC, TRIG, CHOLHDL, LDLDIRECT in the last 72 hours. Thyroid Function Tests Recent Labs    03/03/20 2038  TSH 7.271*   Lab Results  Component Value Date   INR 3.0 (H) 03/06/2020   INR 2.2 (H) 03/04/2020   INR 2.1 (H) 03/03/2020    _____________  DG Chest 1 View  Result Date: 03/03/2020 CLINICAL DATA:  Chest pain and weakness X 1 month, syncopal event. EXAM: CHEST  1 VIEW COMPARISON:  Chest x-ray 09/29/2015, CT chest 01/14/2018 next FINDINGS: Tubes and medical apparatus overlying the right hemithorax. The heart size and mediastinal contours are unchanged. Redemonstration of a aortic arch calcification. Curvilinear well-defined edge  projecting over the lateral apical aspect of the right lung with suggestion of vascular markings extending beyond that line superolaterally. Otherwise both lungs are clear. The visualized skeletal structures are unremarkable. Surgical clips overlie the right hemithorax. IMPRESSION: Curvilinear well-defined edge projecting over the lateral apical aspect of the right lung likely represents a skin fold considering vascular markings extend beyond that line. If high clinical suspicion for pneumothorax, consider repeating chest x-ray for re-evaluation. No active cardiopulmonary disease. Electronically Signed   By: Iven Finn M.D.   On: 03/03/2020 16:19   CT Head Wo Contrast  Result Date: 03/03/2020 CLINICAL DATA:  Head trauma, minor. Spine fracture, cervical, traumatic. EXAM: CT HEAD WITHOUT CONTRAST CT CERVICAL SPINE WITHOUT CONTRAST TECHNIQUE: Multidetector CT imaging of the head and cervical spine was performed following the standard protocol without intravenous contrast. Multiplanar CT image reconstructions of the cervical spine were also generated. COMPARISON:  Head CT 03/30/2013. FINDINGS: CT HEAD FINDINGS Brain: Moderate generalized parenchymal atrophy. Advanced ill-defined hypoattenuation within the cerebral white matter which is nonspecific, but consistent with chronic small vessel ischemic disease. These findings have progressed as compared to the prior head CT of 03/29/2013. Additionally, a tiny chronic lacunar infarct within the right basal ganglia is new as compared to this prior exam. There is no acute intracranial hemorrhage. No demarcated cortical infarct. No extra-axial fluid collection. No evidence of intracranial mass. No midline shift. Vascular: No hyperdense vessel.  Atherosclerotic calcifications Skull: Normal. Negative for fracture or focal lesion. Sinuses/Orbits: Visualized orbits show no acute finding. Mild ethmoid sinus mucosal thickening. No significant mastoid effusion. CT CERVICAL  SPINE FINDINGS Mildly motion degraded examination. Alignment: Rightward rotation of C1 upon C2 may be related to patient head positioning at the time of examination trace C4-C5 grade 1 retrolisthesis. Trace C6-C7 grade 1 anterolisthesis. Skull base and vertebrae: The basion-dental and atlanto-dental intervals are maintained.No evidence of acute fracture to the cervical spine. Soft tissues and spinal canal: No prevertebral fluid or swelling. No visible canal hematoma. Disc levels: Will spondylosis with multilevel disc space narrowing, posterior disc osteophytes, uncovertebral and facet hypertrophy. No high-grade bony spinal canal narrowing. Multilevel bony neural foraminal narrowing. Upper chest: No consolidation within the imaged lung apices. No visible pneumothorax. Other: Calcified atherosclerotic plaque within the visualized aortic arch, proximal major branch vessels  of the neck and at carotid bifurcations. IMPRESSION: CT head: 1. No evidence of acute intracranial abnormality. 2. Moderate generalized parenchymal atrophy with advanced cerebral white matter chronic small vessel ischemic disease. These findings have progressed as compared to the head CT of 03/29/2013. 3. Tiny chronic right basal ganglia lacunar infarct, new as compared to this prior exam. 4. Mild ethmoid sinus mucosal thickening. CT cervical spine: 1. Mildly motion degraded examination. 2. No evidence of acute fracture to the cervical spine. 3. Rightward rotation of C1 upon C2 which may be related to patient head positioning at the time of examination. Clinical correlation is recommended. 4. Trace C4-C5 grade 1 retrolisthesis and C6-C7 grade 1 anterolisthesis. 5. Cervical spondylosis as described. Electronically Signed   By: Kellie Simmering DO   On: 03/03/2020 17:26   CT Cervical Spine Wo Contrast  Result Date: 03/03/2020 CLINICAL DATA:  Head trauma, minor. Spine fracture, cervical, traumatic. EXAM: CT HEAD WITHOUT CONTRAST CT CERVICAL SPINE WITHOUT  CONTRAST TECHNIQUE: Multidetector CT imaging of the head and cervical spine was performed following the standard protocol without intravenous contrast. Multiplanar CT image reconstructions of the cervical spine were also generated. COMPARISON:  Head CT 03/30/2013. FINDINGS: CT HEAD FINDINGS Brain: Moderate generalized parenchymal atrophy. Advanced ill-defined hypoattenuation within the cerebral white matter which is nonspecific, but consistent with chronic small vessel ischemic disease. These findings have progressed as compared to the prior head CT of 03/29/2013. Additionally, a tiny chronic lacunar infarct within the right basal ganglia is new as compared to this prior exam. There is no acute intracranial hemorrhage. No demarcated cortical infarct. No extra-axial fluid collection. No evidence of intracranial mass. No midline shift. Vascular: No hyperdense vessel.  Atherosclerotic calcifications Skull: Normal. Negative for fracture or focal lesion. Sinuses/Orbits: Visualized orbits show no acute finding. Mild ethmoid sinus mucosal thickening. No significant mastoid effusion. CT CERVICAL SPINE FINDINGS Mildly motion degraded examination. Alignment: Rightward rotation of C1 upon C2 may be related to patient head positioning at the time of examination trace C4-C5 grade 1 retrolisthesis. Trace C6-C7 grade 1 anterolisthesis. Skull base and vertebrae: The basion-dental and atlanto-dental intervals are maintained.No evidence of acute fracture to the cervical spine. Soft tissues and spinal canal: No prevertebral fluid or swelling. No visible canal hematoma. Disc levels: Will spondylosis with multilevel disc space narrowing, posterior disc osteophytes, uncovertebral and facet hypertrophy. No high-grade bony spinal canal narrowing. Multilevel bony neural foraminal narrowing. Upper chest: No consolidation within the imaged lung apices. No visible pneumothorax. Other: Calcified atherosclerotic plaque within the visualized  aortic arch, proximal major branch vessels of the neck and at carotid bifurcations. IMPRESSION: CT head: 1. No evidence of acute intracranial abnormality. 2. Moderate generalized parenchymal atrophy with advanced cerebral white matter chronic small vessel ischemic disease. These findings have progressed as compared to the head CT of 03/29/2013. 3. Tiny chronic right basal ganglia lacunar infarct, new as compared to this prior exam. 4. Mild ethmoid sinus mucosal thickening. CT cervical spine: 1. Mildly motion degraded examination. 2. No evidence of acute fracture to the cervical spine. 3. Rightward rotation of C1 upon C2 which may be related to patient head positioning at the time of examination. Clinical correlation is recommended. 4. Trace C4-C5 grade 1 retrolisthesis and C6-C7 grade 1 anterolisthesis. 5. Cervical spondylosis as described. Electronically Signed   By: Kellie Simmering DO   On: 03/03/2020 17:26   ECHOCARDIOGRAM COMPLETE  Result Date: 03/04/2020    ECHOCARDIOGRAM REPORT   Patient Name:   Cynthia Vargas [facility] Date  of Exam: 03/04/2020 Medical Rec #:  [id number]          Height:       62.0 in Accession #:    [id number]         Weight:       104.0 lb Date of Birth:  07/13/1931          BSA:          1.448 m Patient Age:    89 years           BP:           125/92 mmHg Patient Gender: F                  HR:           68 bpm. Exam Location:  Inpatient Procedure: 2D Echo Indications:    syncope 780.2  History:        Patient has no prior history of Echocardiogram examinations.                 Signs/Symptoms:Syncope.  Sonographer:    Johny Chess Referring Phys: [id number] [place]  1. Left ventricular ejection fraction, by estimation, is 60 to 65%. The left ventricle has normal function. The left ventricle has no regional wall motion abnormalities. Left ventricular diastolic parameters are indeterminate.  2. Right ventricular systolic function is normal. The right ventricular size is  normal. There is normal pulmonary artery systolic pressure.  3. Left atrial size was severely dilated.  4. Right atrial size was mild to moderately dilated.  5. The mitral valve is normal in structure. Mild mitral valve regurgitation. No evidence of mitral stenosis.  6. Tricuspid valve regurgitation is moderate.  7. The aortic valve is tricuspid. Aortic valve regurgitation is mild. Mild aortic valve sclerosis is present, with no evidence of aortic valve stenosis.  8. The inferior vena cava is normal in size with greater than 50% respiratory variability, suggesting right atrial pressure of 3 mmHg. Comparison(s): No significant change from prior study. Conclusion(s)/Recommendation(s): Otherwise normal echocardiogram, with minor abnormalities described in the report. FINDINGS  Left Ventricle: Left ventricular ejection fraction, by estimation, is 60 to 65%. The left ventricle has normal function. The left ventricle has no regional wall motion abnormalities. The left ventricular internal cavity size was normal in size. There is  no left ventricular hypertrophy. Left ventricular diastolic parameters are indeterminate. Right Ventricle: The right ventricular size is normal. No increase in right ventricular wall thickness. Right ventricular systolic function is normal. There is normal pulmonary artery systolic pressure. The tricuspid regurgitant velocity is 2.59 m/s, and  with an assumed right atrial pressure of 3 mmHg, the estimated right ventricular systolic pressure is 69.6 mmHg. Left Atrium: Left atrial size was severely dilated. Right Atrium: Right atrial size was mild to moderately dilated. Pericardium: A small pericardial effusion is present. The pericardial effusion is circumferential. Mitral Valve: The mitral valve is normal in structure. Mild mitral annular calcification. Mild mitral valve regurgitation. No evidence of mitral valve stenosis. Tricuspid Valve: The tricuspid valve is normal in structure. Tricuspid  valve regurgitation is moderate . No evidence of tricuspid stenosis. Aortic Valve: The aortic valve is tricuspid. Aortic valve regurgitation is mild. Aortic regurgitation PHT measures 522 msec. Mild aortic valve sclerosis is present, with no evidence of aortic valve stenosis. Pulmonic Valve: The pulmonic valve was grossly normal. Pulmonic valve regurgitation is trivial. Aorta: The aortic root, ascending aorta, aortic arch and descending aorta are all  structurally normal, with no evidence of dilitation or obstruction. Venous: The inferior vena cava is normal in size with greater than 50% respiratory variability, suggesting right atrial pressure of 3 mmHg. IAS/Shunts: No atrial level shunt detected by color flow Doppler.  LEFT VENTRICLE PLAX 2D LVIDd:         4.10 cm  Diastology LVIDs:         2.30 cm  LV e' lateral:   9.14 cm/s LV PW:         0.80 cm  LV E/e' lateral: 10.9 LV IVS:        0.70 cm  LV e' medial:    8.49 cm/s LVOT diam:     1.50 cm  LV E/e' medial:  11.8 LV SV:         40 LV SV Index:   28 LVOT Area:     1.77 cm  RIGHT VENTRICLE             IVC RV S prime:     10.90 cm/s  IVC diam: 1.70 cm TAPSE (M-mode): 1.6 cm LEFT ATRIUM             Index       RIGHT ATRIUM           Index LA diam:        3.80 cm 2.62 cm/m  RA Area:     15.20 cm LA Vol (A2C):   56.2 ml 38.82 ml/m RA Volume:   34.20 ml  23.62 ml/m LA Vol (A4C):   58.0 ml 40.06 ml/m LA Biplane Vol: 58.1 ml 40.13 ml/m  AORTIC VALVE LVOT Vmax:   104.00 cm/s LVOT Vmean:  71.700 cm/s LVOT VTI:    0.226 m AI PHT:      522 msec  AORTA Ao Root diam: 3.40 cm Ao Asc diam:  3.70 cm MITRAL VALVE               TRICUSPID VALVE MV Area (PHT): 3.72 cm    TR Peak grad:   26.8 mmHg MV Decel Time: 204 msec    TR Vmax:        259.00 cm/s MV E velocity: 99.80 cm/s MV A velocity: 34.90 cm/s  SHUNTS MV E/A ratio:  2.86        Systemic VTI:  0.23 m                            Systemic Diam: 1.50 cm Buford Dresser MD Electronically signed by Buford Dresser MD Signature Date/Time: 03/04/2020/12:21:08 PM    Final    Disposition   Pt is being discharged home today in improved condition.  Follow-up Plans & Appointments     Follow-up Information    Sueanne Margarita, MD Follow up.   Specialty: Cardiology Why: The office will call. Contact information: [address] [place] [id number] [phone]              Discharge Instructions    Amb referral to AFIB Clinic   Complete by: As directed    Diet - low sodium heart healthy   Complete by: As directed    Increase activity slowly   Complete by: As directed       Discharge Medications   Allergies as of 03/06/2020      Reactions   Peanut-containing Drug Products Rash      Medication List    TAKE  these medications   amiodarone 200 MG tablet Commonly known as: PACERONE Take 1 tablet (200 mg total) by mouth every 12 (twelve) hours. 1 tab bid till 09/13, then 1 tab daily.   ascorbic acid 500 MG tablet Commonly known as: VITAMIN C Take 1,000 mg by mouth 2 (two) times daily.   atorvastatin 20 MG tablet Commonly known as: LIPITOR Take 20 mg by mouth daily.   cyanocobalamin 2000 MCG tablet Take 2,000 mcg by mouth daily.   diltiazem 180 MG 24 hr capsule Commonly known as: CARDIZEM CD Take 1 capsule (180 mg total) by mouth daily. What changed:   medication strength  how much to take   moxifloxacin 0.5 % ophthalmic solution Commonly known as: VIGAMOX Place 1 drop into the right eye 4 (four) times daily.   multivitamin with minerals Tabs tablet Take 1 tablet by mouth daily.   prednisoLONE acetate 1 % ophthalmic suspension Commonly known as: PRED FORTE Place 1 drop into the right eye 4 (four) times daily.   Prolensa 0.07 % Soln Generic drug: Bromfenac Sodium Place 1 drop into the right eye at bedtime.   warfarin 5 MG tablet Commonly known as: COUMADIN Take 7.5-10 mg by mouth See admin instructions. Take 2 tablets (10 mg) by mouth on  Tuesday nights, take 1 1/2 tablets (7.5 mg) on all other nights of the week.          Outstanding Labs/Studies   None  Duration of Discharge Encounter   Greater than 30 minutes including physician time.  Signed, Rosaria Ferries, PA-C 03/06/2020, 1:23 PM

## 2020-03-06 NOTE — Progress Notes (Signed)
[place] for warfarin Indication: atrial fibrillation  Allergies  Allergen Reactions  . Peanut-Containing Drug Products Rash    Patient Measurements: Height: 5\' 2"  (157.5 cm) Weight: 48.2 kg (106 lb 4.8 oz) IBW/kg (Calculated) : 50.1  Vital Signs: Temp: 98.1 F (36.7 C) (09/06 0719) Temp Source: Oral (09/06 0719) BP: 129/75 (09/06 0900) Pulse Rate: 73 (09/06 0900)  Labs: Recent Labs    03/03/20 1601 03/03/20 1601 03/03/20 2038 03/03/20 2204 03/04/20 0125 03/04/20 0125 03/05/20 0540 03/06/20 0443  HGB 13.8   < >  --   --  13.2   < > 13.2 13.7  HCT 42.4   < >  --   --  41.0  --  41.1 41.4  PLT 191   < >  --   --  181  --  196 238  LABPROT 24.1*   < > 23.0*  --  23.8*  --   --  30.2*  INR 2.3*   < > 2.1*  --  2.2*  --   --  3.0*  CREATININE 0.93  --   --   --  0.90  --   --  1.23*  TROPONINIHS <2   < > 4 4 5   --   --   --    < > = values in this interval not displayed.    Estimated Creatinine Clearance: 25 mL/min (A) (by C-G formula based on SCr of 1.23 mg/dL (H)).   Medical History: Past Medical History:  Diagnosis Date  . Coronary artery disease     Assessment: 88yoF on warfarin PTA s/p cardioversion in the ED for afib w/ RVR. Warfarin PTA dose is 10mg  on Tuesdays and 7.5mg  on all other days. INR therapeutic on admission at 2.3 and continues to be therapeutic during hospital stay.    Patient started on amiodarone which can increase the anticoagulant effects of warfarin but may not be seen for days/weeks. Will continue to closely monitor INR in the hospital and will require close follow up as an outpatient.   INR is 3.0 today which is higher end of target range with INR trending up during stay. Patient may be discharged later today and will be followed closely as an outpatient. Will follow for discharge plan. LFTs and CBC stable, no bleeding noted.   Goal of Therapy:  INR 2-3 Monitor platelets by anticoagulation  protocol: Yes   Plan:  Continue warfarin 10mg  on Tuesdays and 7.5mg  on all other days Daily INR, monitor CBC  Romilda Garret, PharmD PGY1 Acute Care Pharmacy Resident Phone: [phone] 03/06/2020 9:42 AM  Please check [email or web address] for unit specific pharmacy phone numbers.

## 2020-03-06 NOTE — Progress Notes (Signed)
Progress Note  Patient Name: Cynthia Vargas Date of Encounter: 03/06/2020  [facility] Cardiologist: Fransico Him, MD   Subjective   Converted to sinus rhythm at 1 PM yesterday. Feeling much better, but hasn't gotten out of bed yet. Would like to see if she can go home today. No chest pain or shortness of breath.  Inpatient Medications    Scheduled Meds: . amiodarone  200 mg Oral Q12H   Followed by  . [START ON 03/13/2020] amiodarone  200 mg Oral Daily  . atorvastatin  20 mg Oral Daily  . diltiazem  180 mg Oral Daily  . ketorolac  1 drop Right Eye QID  . prednisoLONE acetate  1 drop Right Eye QID  . [START ON 03/07/2020] warfarin  10 mg Oral Once per day on Tue  . warfarin  7.5 mg Oral Once per day on Sun Mon Wed Thu Fri Sat  . Warfarin - Pharmacist Dosing Inpatient   Does not apply q1600   Continuous Infusions:  PRN Meds: acetaminophen, ondansetron (ZOFRAN) IV   Vital Signs    Vitals:   03/05/20 1234 03/05/20 2100 03/06/20 0400 03/06/20 0719  BP: 127/88 128/78 129/71   Pulse: 74 66 63   Resp: 20 19 13    Temp: 98.2 F (36.8 C) 98 F (36.7 C) 98.2 F (36.8 C) 98.1 F (36.7 C)  TempSrc:  Oral Oral Oral  SpO2:  93% 92%   Weight:   48.2 kg   Height:        Intake/Output Summary (Last 24 hours) at 03/06/2020 2993 Last data filed at 03/06/2020 0820 Gross per 24 hour  Intake 960 ml  Output 1700 ml  Net -740 ml   Last 3 Weights 03/06/2020 03/05/2020 03/04/2020  Weight (lbs) 106 lb 4.8 oz 105 lb 4.8 oz 104 lb  Weight (kg) 48.217 kg 47.764 kg 47.174 kg      Telemetry    Afib RVR until 1300 yesterday, then has been normal sinus rhythm since- Personally Reviewed  ECG    9/3 post cardioversion, SR at 73 bpm. ECG on presentation appears to be atypical atrial flutter with variable conduction- Personally Reviewed  Physical Exam   GEN: Well nourished, well developed in no acute distress NECK: No JVD CARDIAC: regular rhythm, normal S1 and S2, no rubs or gallops. No  murmur. VASCULAR: Radial pulses 2+ bilaterally.  RESPIRATORY:  Clear to auscultation without rales, wheezing or rhonchi  ABDOMEN: Soft, non-tender, non-distended MUSCULOSKELETAL:  Moves all 4 limbs independently SKIN: Warm and dry, no edema NEUROLOGIC:  No focal neuro deficits noted. PSYCHIATRIC:  Normal affect   Labs    High Sensitivity Troponin:   Recent Labs  Lab 03/03/20 1601 03/03/20 2038 03/03/20 2204 03/04/20 0125  TROPONINIHS 2 4 4 5       Chemistry Recent Labs  Lab 03/03/20 1601 03/04/20 0125 03/06/20 0443  NA 131* 136 129*  K 4.7 4.0 4.3  CL 101 101 97*  CO2 19* 26 20*  GLUCOSE 101* 116* 90  BUN 14 10 23   CREATININE 0.93 0.90 1.23*  CALCIUM 8.8* 8.9 8.7*  PROT 6.7  --   --   ALBUMIN 4.1  --   --   AST 30  --   --   ALT 19  --   --   ALKPHOS 45  --   --   BILITOT 0.7  --   --   GFRNONAA 56* 58* 40*  GFRAA >60 >60 46*  ANIONGAP 11  9 12     Hematology Recent Labs  Lab 03/04/20 0125 03/05/20 0540 03/06/20 0443  WBC 4.6 3.9* 5.6  RBC 4.41 4.44 4.43  HGB 13.2 13.2 13.7  HCT 41.0 41.1 41.4  MCV 93.0 92.6 93.5  MCH 29.9 29.7 30.9  MCHC 32.2 32.1 33.1  RDW 14.2 14.2 14.3  PLT 181 196 238    BNP Recent Labs  Lab 03/03/20 1601  BNP 479.8*     DDimer No results for input(s): DDIMER in the last 168 hours.   Radiology    ECHOCARDIOGRAM COMPLETE  Result Date: 03/04/2020    ECHOCARDIOGRAM REPORT   Patient Name:   ISOLA MEHLMAN [facility] Date of Exam: 03/04/2020 Medical Rec #:  [id number]          Height:       62.0 in Accession #:    [id number]         Weight:       104.0 lb Date of Birth:  06-Sep-1931          BSA:          1.448 m Patient Age:    86 years           BP:           125/92 mmHg Patient Gender: F                  HR:           68 bpm. Exam Location:  Inpatient Procedure: 2D Echo Indications:    syncope 780.2  History:        Patient has no prior history of Echocardiogram examinations.                 Signs/Symptoms:Syncope.  Sonographer:     Johny Chess Referring Phys: [id number] [place]  1. Left ventricular ejection fraction, by estimation, is 60 to 65%. The left ventricle has normal function. The left ventricle has no regional wall motion abnormalities. Left ventricular diastolic parameters are indeterminate.  2. Right ventricular systolic function is normal. The right ventricular size is normal. There is normal pulmonary artery systolic pressure.  3. Left atrial size was severely dilated.  4. Right atrial size was mild to moderately dilated.  5. The mitral valve is normal in structure. Mild mitral valve regurgitation. No evidence of mitral stenosis.  6. Tricuspid valve regurgitation is moderate.  7. The aortic valve is tricuspid. Aortic valve regurgitation is mild. Mild aortic valve sclerosis is present, with no evidence of aortic valve stenosis.  8. The inferior vena cava is normal in size with greater than 50% respiratory variability, suggesting right atrial pressure of 3 mmHg. Comparison(s): No significant change from prior study. Conclusion(s)/Recommendation(s): Otherwise normal echocardiogram, with minor abnormalities described in the report. FINDINGS  Left Ventricle: Left ventricular ejection fraction, by estimation, is 60 to 65%. The left ventricle has normal function. The left ventricle has no regional wall motion abnormalities. The left ventricular internal cavity size was normal in size. There is  no left ventricular hypertrophy. Left ventricular diastolic parameters are indeterminate. Right Ventricle: The right ventricular size is normal. No increase in right ventricular wall thickness. Right ventricular systolic function is normal. There is normal pulmonary artery systolic pressure. The tricuspid regurgitant velocity is 2.59 m/s, and  with an assumed right atrial pressure of 3 mmHg, the estimated right ventricular systolic pressure is 97.0 mmHg. Left Atrium: Left atrial size was severely dilated. Right Atrium:  Right atrial size  was mild to moderately dilated. Pericardium: A small pericardial effusion is present. The pericardial effusion is circumferential. Mitral Valve: The mitral valve is normal in structure. Mild mitral annular calcification. Mild mitral valve regurgitation. No evidence of mitral valve stenosis. Tricuspid Valve: The tricuspid valve is normal in structure. Tricuspid valve regurgitation is moderate . No evidence of tricuspid stenosis. Aortic Valve: The aortic valve is tricuspid. Aortic valve regurgitation is mild. Aortic regurgitation PHT measures 522 msec. Mild aortic valve sclerosis is present, with no evidence of aortic valve stenosis. Pulmonic Valve: The pulmonic valve was grossly normal. Pulmonic valve regurgitation is trivial. Aorta: The aortic root, ascending aorta, aortic arch and descending aorta are all structurally normal, with no evidence of dilitation or obstruction. Venous: The inferior vena cava is normal in size with greater than 50% respiratory variability, suggesting right atrial pressure of 3 mmHg. IAS/Shunts: No atrial level shunt detected by color flow Doppler.  LEFT VENTRICLE PLAX 2D LVIDd:         4.10 cm  Diastology LVIDs:         2.30 cm  LV e' lateral:   9.14 cm/s LV PW:         0.80 cm  LV E/e' lateral: 10.9 LV IVS:        0.70 cm  LV e' medial:    8.49 cm/s LVOT diam:     1.50 cm  LV E/e' medial:  11.8 LV SV:         40 LV SV Index:   28 LVOT Area:     1.77 cm  RIGHT VENTRICLE             IVC RV S prime:     10.90 cm/s  IVC diam: 1.70 cm TAPSE (M-mode): 1.6 cm LEFT ATRIUM             Index       RIGHT ATRIUM           Index LA diam:        3.80 cm 2.62 cm/m  RA Area:     15.20 cm LA Vol (A2C):   56.2 ml 38.82 ml/m RA Volume:   34.20 ml  23.62 ml/m LA Vol (A4C):   58.0 ml 40.06 ml/m LA Biplane Vol: 58.1 ml 40.13 ml/m  AORTIC VALVE LVOT Vmax:   104.00 cm/s LVOT Vmean:  71.700 cm/s LVOT VTI:    0.226 m AI PHT:      522 msec  AORTA Ao Root diam: 3.40 cm Ao Asc diam:  3.70 cm  MITRAL VALVE               TRICUSPID VALVE MV Area (PHT): 3.72 cm    TR Peak grad:   26.8 mmHg MV Decel Time: 204 msec    TR Vmax:        259.00 cm/s MV E velocity: 99.80 cm/s MV A velocity: 34.90 cm/s  SHUNTS MV E/A ratio:  2.86        Systemic VTI:  0.23 m                            Systemic Diam: 1.50 cm Buford Dresser MD Electronically signed by Buford Dresser MD Signature Date/Time: 03/04/2020/12:21:08 PM    Final     Cardiac Studies   Echo 03/04/20: 1. Left ventricular ejection fraction, by estimation, is 60 to 65%. The  left ventricle has normal function. The left ventricle has  no regional  wall motion abnormalities. Left ventricular diastolic parameters are  indeterminate.  2. Right ventricular systolic function is normal. The right ventricular  size is normal. There is normal pulmonary artery systolic pressure.  3. Left atrial size was severely dilated.  4. Right atrial size was mild to moderately dilated.  5. The mitral valve is normal in structure. Mild mitral valve  regurgitation. No evidence of mitral stenosis.  6. Tricuspid valve regurgitation is moderate.  7. The aortic valve is tricuspid. Aortic valve regurgitation is mild.  Mild aortic valve sclerosis is present, with no evidence of aortic valve  stenosis.  8. The inferior vena cava is normal in size with greater than 50%  respiratory variability, suggesting right atrial pressure of 3 mmHg.   Patient Profile     86 y.o. female with paroxysmal atrial fibrillation, non-obstructive CAD, stage IIb lung cancer, prior pericarditis with pericardial effusion, hyperlipidemia and GERD admitted with syncope  Assessment & Plan    Syncope: -no pauses or ventricular rhythms on telemetry -echo unchanged -her atrial fibrillation can be in the 150-160 bpm range based on her presentation, but hasn't been so fast that I would expect a low perfusing rhythm given normal EF. However, she endorses feeling weak, dizzy,  exhausted with her afib, so while it may not be a direct cardiac cause, could be influenced by her afib -aim for normal sinus rhythm, monitor for symptoms. If symptoms recur, consider outpatient monitor.  Atrial fibrillation, paroxymsal with atrial flutter with RVR -CHA2DS2/VAS Stroke Risk Points= at least 4 -anticoagulation with warfarin. INR 3.0 today -cardioverted in ER, but return to afib.  -will likely need adjustment to coumadin dose with initiation of amiodarone. Will need close follow up with coumadin clinic to monitor -continue diltiazem while loading, may be able to stop once in sinus rhythm -converting to oral amiodarone today.  -ambulate hall, make sure she is doing well before discharge -follow up with Dr. Radford Pax as outpatient -check LFTs, TFTs, PFTs as outpatient  Hypertension: well controlled currently  Nonobstructive CAD: no chest pain  Not addressed this admission: -stage 2b lung cancer  Disposition: if she ambulates and does well, can be discharged to home later today  Buford Dresser, MD, PhD [facility]  [facility]   For questions or updates, please contact [facility] Please consult [email or web address] for contact info under     Signed, Buford Dresser, MD  03/06/2020, 9:07 AM

## 2020-03-06 NOTE — Progress Notes (Signed)
SATURATION QUALIFICATIONS: (This note is used to comply with regulatory documentation for home oxygen)  Patient Saturations on Room Air at Rest = 97%  Patient Saturations on Room Air while Ambulating = 95%    Please briefly explain why patient needs home oxygen: Patient does not need home oxygen

## 2020-03-07 ENCOUNTER — Other Ambulatory Visit: Payer: Self-pay

## 2020-03-07 ENCOUNTER — Ambulatory Visit (INDEPENDENT_AMBULATORY_CARE_PROVIDER_SITE_OTHER): Payer: Medicare Other | Admitting: *Deleted

## 2020-03-07 DIAGNOSIS — Z7901 Long term (current) use of anticoagulants: Secondary | ICD-10-CM

## 2020-03-07 DIAGNOSIS — I4891 Unspecified atrial fibrillation: Secondary | ICD-10-CM | POA: Diagnosis not present

## 2020-03-07 LAB — POCT INR: INR: 3.3 — AB (ref 2.0–3.0)

## 2020-03-07 NOTE — Patient Instructions (Signed)
Description   Do not take any Warfarin today then start taking 1.5 tablets daily.  Recheck in 1 week. Coumadin Clinic [phone].

## 2020-03-09 ENCOUNTER — Encounter: Payer: Self-pay | Admitting: Nurse Practitioner

## 2020-03-14 ENCOUNTER — Other Ambulatory Visit: Payer: Self-pay

## 2020-03-14 ENCOUNTER — Ambulatory Visit (INDEPENDENT_AMBULATORY_CARE_PROVIDER_SITE_OTHER): Payer: Medicare Other | Admitting: *Deleted

## 2020-03-14 DIAGNOSIS — I4891 Unspecified atrial fibrillation: Secondary | ICD-10-CM | POA: Diagnosis not present

## 2020-03-14 DIAGNOSIS — Z7901 Long term (current) use of anticoagulants: Secondary | ICD-10-CM | POA: Diagnosis not present

## 2020-03-14 LAB — POCT INR: INR: 2.6 (ref 2.0–3.0)

## 2020-03-14 NOTE — Patient Instructions (Addendum)
Description   Continue taking Warfarin 1.5 tablets daily.  Recheck in 10 days-AMIO 200mg  BID 03/03/20-03/13/20 then today on 03/14/2020 QD. Coumadin Clinic [phone].

## 2020-03-15 ENCOUNTER — Encounter: Payer: Self-pay | Admitting: Physician Assistant

## 2020-03-17 ENCOUNTER — Encounter: Payer: Self-pay | Admitting: Physician Assistant

## 2020-03-17 ENCOUNTER — Other Ambulatory Visit: Payer: Self-pay

## 2020-03-17 ENCOUNTER — Ambulatory Visit (INDEPENDENT_AMBULATORY_CARE_PROVIDER_SITE_OTHER): Payer: Medicare Other | Admitting: Medical

## 2020-03-17 ENCOUNTER — Telehealth: Payer: Self-pay | Admitting: Radiology

## 2020-03-17 VITALS — BP 140/80 | HR 61 | Ht 62.0 in | Wt 108.6 lb

## 2020-03-17 DIAGNOSIS — R946 Abnormal results of thyroid function studies: Secondary | ICD-10-CM | POA: Diagnosis not present

## 2020-03-17 DIAGNOSIS — I10 Essential (primary) hypertension: Secondary | ICD-10-CM

## 2020-03-17 DIAGNOSIS — I2510 Atherosclerotic heart disease of native coronary artery without angina pectoris: Secondary | ICD-10-CM | POA: Diagnosis not present

## 2020-03-17 DIAGNOSIS — N179 Acute kidney failure, unspecified: Secondary | ICD-10-CM | POA: Diagnosis not present

## 2020-03-17 DIAGNOSIS — R7989 Other specified abnormal findings of blood chemistry: Secondary | ICD-10-CM | POA: Diagnosis not present

## 2020-03-17 DIAGNOSIS — R55 Syncope and collapse: Secondary | ICD-10-CM | POA: Diagnosis not present

## 2020-03-17 DIAGNOSIS — I480 Paroxysmal atrial fibrillation: Secondary | ICD-10-CM

## 2020-03-17 DIAGNOSIS — E871 Hypo-osmolality and hyponatremia: Secondary | ICD-10-CM

## 2020-03-17 MED ORDER — DILTIAZEM HCL 30 MG PO TABS: 30.0000 mg | ORAL_TABLET | Freq: Two times a day (BID) | ORAL | 0 refills | Status: AC | PRN

## 2020-03-17 NOTE — Telephone Encounter (Signed)
Enrolled patient for a 14 day Zio AT monitor to be mailed to patients home.  

## 2020-03-17 NOTE — Patient Instructions (Addendum)
Medication Instructions:  Your physician has recommended you make the following change in your medication:  1.  STOP the Diltiazem 180 2.  START Diltiazem 30 mg taking 1 tablet twice a day ONLY AS NEEDED FOR RATE CONTROL   *If you need a refill on your cardiac medications before your next appointment, please call your pharmacy*   Lab Work: TODAY:  BMET  If you have labs (blood work) drawn today and your tests are completely normal, you will receive your results only by: [name] MyChart Message (if you have MyChart) OR . A paper copy in the mail If you have any lab test that is abnormal or we need to change your treatment, we will call you to review the results.   Testing/Procedures:  ZIO AT Long term monitor-Live Telemetry  Your physician has requested you wear a ZIO patch monitor for  14 days.  This is a single patch monitor. [employer] supplies one patch monitor per enrollment. Additional stickers are not available.  Please do not apply patch if you will be having a Nuclear Stress Test, Echocardiogram, Cardiac CT, MRI, or Chest Xray during the time frame you would be wearing the monitor. The patch cannot be worn during these tests. You cannot remove and re-apply the ZIO AT patch monitor.   Your ZIO patch monitor will be sent Fed Ex from [employer] directly to your home address. The monitor may also be mailed to a PO BOX if home delivery is not available. It may take 3-5 days to receive your monitor after you have been enrolled.  Once you have received you monitor, please review enclosed instructions. Your monitor has already been registered assigning a specific monitor serial # to you.   Applying the monitor  Shave hair from upper left chest.  Hold abrader disc by orange tab. Rub abrader in 40 strokes over left upper chest as indicated in your monitor instructions.  Clean area with 4 enclosed alcohol pads. Use all pads to ensure the area is cleaned thoroughly. Let dry.  Apply patch  as indicated in monitor instructions. Patch will be placed under collarbone on left side of chest with arrow pointing upward.  Rub patch adhesive wings for 2 minutes. Remove the white label marked "1". Remove the white label marked "2". Rub patch adhesive wings for 2 additional minutes.  While looking in a mirror, press and release button in center of patch. A small green light will flash 3-4 times. This will be your only indicator the monitor has been turned on.  Do not shower for the first 24 hours. You may shower after the first 24 hours.  Press the button if you feel a symptom. You will hear a small click. Record Date, Time and Symptom in the Patient Log.   Starting the Gateway  In your kit there is a Hydrographic surveyor box the size of a cellphone. This is Airline pilot. It transmits all your recorded data to [facility]. This box must stay within 10 feet of you at all times. Open the box and push the * button. There will be a light that blinks orange and then green a few times. When the light stops blinking, the Gateway is connected to the ZIO patch.  Call [employer] at [phone] to confirm your monitor is transmitting.   Returning your monitor  Remove your patch and place it inside the [place]. In the lower half of the Gateway there is a white bag with prepaid postage on it. Place Gateway  in bag and seal. Mail package back to [facility] as soon as possible. Your physician should have your final report approximately 7 days after you have mailed back your monitor.   Call [facility] at [phone] if you have questions regarding your ZIO AT patch monitor. Call them immediately if you see an orange light blinking on your monitor.  If your monitor falls off in less than 4 days contact our Monitor department at [phone]. If your monitor becomes loose or falls off after 4 days call [employer] at [phone] for suggestions on securing your monitor.   Follow-Up: At [facility],  you and your health needs are our priority.  As part of our continuing mission to provide you with exceptional heart care, we have created designated Provider Care Teams.  These Care Teams include your primary Cardiologist (physician) and Advanced Practice Providers (APPs -  Physician Assistants and Nurse Practitioners) who all work together to provide you with the care you need, when you need it.  We recommend signing up for the patient portal called "MyChart".  Sign up information is provided on this After Visit Summary.  MyChart is used to connect with patients for Virtual Visits (Telemedicine).  Patients are able to view lab/test results, encounter notes, upcoming appointments, etc.  Non-urgent messages can be sent to your provider as well.   To learn more about what you can do with MyChart, go to [email or web address].    Your next appointment:   3 month(s)  The format for your next appointment:   In Person  Provider:   You may see Fransico Him, MD or one of the following Advanced Practice Providers on your designated Care Team:    Melina Copa, PA-C  Ermalinda Barrios, PA-C      Other Instructions Monitor your blood pressure, 3 hours after morning medications.  Keep a log and call the readings into the office in 1 week.

## 2020-03-17 NOTE — Progress Notes (Signed)
Cardiology Office Note  Date:  03/17/2020   ID:  Cynthia, Vargas [birth year], MRN [id number]  PCP:  Hoyt Koch, MD  Cardiologist:  Dr. Radford Pax  _____________  Hospital follow-up  _____________   History of Present Illness: Cynthia Vargas is a 86 y.o. female with pmh of paroxysmal afib, non-obstructive CAD by CT 12/2017, stage IIb lung cancer, prior pericarditis with pericardial effusion, hyperlipidemia, GERD, occult CVA by CT 03/2020 who presents for post-hospital follow-up. She was recently admitted with syncope and rapid afib and chest pressure. Etiology of her syncope was unclear. She was cardioverted in the ED but went back into afib. Cardizem was uptitrated for better HR conrol. However, she had problems with bradycardia and the drip was discontinued. She was converted to oral dilt. She was also started on amiodarone for maintenance of NSR with recommendations to consider stopped dilt once in NSR. Echo showed EF 60-65%, normal RV, severe LAE, mod MR, mod TR, mild AI. Trops negative. On the day of discharge labs showed mild AKI and hyponatremia.   Today, she is accompanied by her daughter who has lots of questions and is very detailed oriented. The patient is generally doing well since discharge. No recurrent syncope or pre-syncope. Patient says she is feeling very tired and has less energy than before. She was walking a mile prior to discharge and has not been able to complete. They think that her symptoms are due to her medications. EKG shows that she is in sinus rhythm. Heart rate is 61, BP a little high today. We can stop diltiazem and given short-acting to use as needed. She is drinking decaffeinated coffee daily. She denies symptoms of palpitations, chest pain, shortness of breath, orthopnea, PND, lower extremity edema, claudication, dizziness, presyncope, syncope, bleeding, or neurologic sequela. The patient is tolerating medications without difficulties and is  otherwise without complaint today.  _____________   Past Medical History:  Diagnosis Date  . Acute pericarditis 11/01/2015   a. moderate pericardial effusion by echo 09/2015 - signficantly improved with trivial effusion on repeat echo after NSAIDS and colchicine 10/2015; b. 07/2019 Small effusion on echo.  [name] CALCANEAL FRACTURE, RIGHT 11/04/2007  . Essential hypertension   . GERD (gastroesophageal reflux disease)   . Hyperlipidemia LDL goal <70 02/08/2016  . KNEE PAIN, RIGHT, CHRONIC 03/19/2010  . Lung nodule    a. found on CT 2017 - possible stage IIB carcinoma.  . Mild Aortic insufficiency   . Mild Mitral regurgitation   . Moderate tricuspid regurgitation   . Nonobstructive CAD (coronary artery disease)    a. 11/2017 MV: EF 71%. No ischemia; b. 12/2017 Cor CTA: 0-25% ostial LM and 40-69% pLAD with normal FFR.  . OSTEOPOROSIS 05/02/2007  . PAF (paroxysmal atrial fibrillation) ([place])    a. Dx 03/2018--converted on oral Dilt.  CHA2DS2VASc = 5-->warfarin.  [name] PEPTIC ULCER DISEASE 05/02/2007  . Syncope    Past Surgical History:  Procedure Laterality Date  . Calcaneal fracture, right    . CHOLECYSTECTOMY    . DILATION AND CURETTAGE OF UTERUS    . ESOPHAGOGASTRODUODENOSCOPY N/A 03/30/2013   Procedure: ESOPHAGOGASTRODUODENOSCOPY (EGD);  Surgeon: Jerene Bears, MD;  Location: [facility];  Service: Endoscopy;  Laterality: N/A;  . FUDUCIAL PLACEMENT N/A 09/29/2015   Procedure:  PLACEMENT OF FUDUCIAL Marker times three;  Surgeon: [facility] Isaac, MD;  Location: [facility];  Service: Thoracic;  Laterality: N/A;  . LUNG BIOPSY N/A 09/29/2015   Procedure: LUNG BIOPSY;  Surgeon: Percell Miller  Maryruth Bun, MD;  Location: [facility];  Service: Thoracic;  Laterality: N/A;  . ORIF HIP FRACTURE    . TONSILLECTOMY    . VIDEO BRONCHOSCOPY WITH ENDOBRONCHIAL NAVIGATION N/A 09/29/2015   Procedure: VIDEO BRONCHOSCOPY WITH ENDOBRONCHIAL NAVIGATION;  Surgeon: [facility] Isaac, MD;  Location: [facility];  Service: Thoracic;  Laterality: N/A;    _____________  Current Outpatient Medications  Medication Sig Dispense Refill  . alendronate (FOSAMAX) 70 MG tablet Take 70 mg by mouth once a week. Take with a full glass of water on an empty stomach.    [name] amiodarone (PACERONE) 200 MG tablet Take 1 tablet (200 mg total) by mouth every 12 (twelve) hours. 1 tab bid till 09/13, then 1 tab daily. 40 tablet 6  . ascorbic acid (VITAMIN C) 500 MG tablet Take 1,000 mg by mouth 2 (two) times daily.    [name] atorvastatin (LIPITOR) 20 MG tablet Take 1 tablet (20 mg total) by mouth daily at 6 PM. 90 tablet 2  . atorvastatin (LIPITOR) 20 MG tablet Take 20 mg by mouth daily.    . Bromfenac Sodium (PROLENSA) 0.07 % SOLN Place 1 drop into the right eye at bedtime.     . cyanocobalamin 2000 MCG tablet Take 2,000 mcg by mouth daily.    [name] diltiazem (CARDIZEM CD) 120 MG 24 hr capsule Take 1 capsule (120 mg total) by mouth daily. 90 capsule 3  . diltiazem (CARDIZEM CD) 180 MG 24 hr capsule Take 1 capsule (180 mg total) by mouth daily. 30 capsule 6  . moxifloxacin (VIGAMOX) 0.5 % ophthalmic solution Place 1 drop into the right eye 4 (four) times daily.     . Multiple Vitamin (MULTIVITAMIN WITH MINERALS) TABS tablet Take 1 tablet by mouth daily.    . Multiple Vitamin (MULTIVITAMIN) tablet Take 1 tablet by mouth daily.    . Multiple Vitamins-Minerals (MULTIVITAMIN PO) Take 1 tablet by mouth.    . nitroGLYCERIN (NITROSTAT) 0.4 MG SL tablet Place 0.4 mg under the tongue every 5 (five) minutes as needed for chest pain.    . prednisoLONE acetate (PRED FORTE) 1 % ophthalmic suspension Place 1 drop into the right eye 4 (four) times daily.     [name] warfarin (COUMADIN) 5 MG tablet TAKE 1 AND 1/2 TABLET DAILY EXCEPT 2 TABLETS ON TUESDAY AND THURSDAY OR AS DIRECTED BY COUMADIN CLINIC 60 tablet 2  . warfarin (COUMADIN) 5 MG tablet Take 7.5-10 mg by mouth See admin instructions. Take 2 tablets (10 mg) by mouth on Tuesday nights, take 1 1/2 tablets (7.5 mg) on all other nights of the  week.     No current facility-administered medications for this visit.   _____________   Allergies:   Peanut-containing drug products and Peanut-containing drug products  _____________   Social History:  The patient  reports that she has never smoked. She has never used smokeless tobacco. She reports current alcohol use of about 1.0 standard drink of alcohol per week. She reports that she does not use drugs.  _____________   Family History:  The patient's family history includes Cancer in her father; Heart disease in her mother.  _____________   ROS:  Please see the history of present illness.   Positive for generalized fatigue,   All other systems are reviewed and negative.  _____________   PHYSICAL EXAM: VS:  There were no vitals taken for this visit. , BMI There is no height or weight on file to calculate BMI. GEN: Well nourished, well  developed, in no acute distress  HEENT: normal  Neck: no JVD, carotid bruits, or masses Cardiac: RRR; no murmurs, rubs, or gallops. No clubbing, cyanosis, edema.  Radials/DP/PT 2+ and equal bilaterally.  Respiratory:  clear to auscultation bilaterally, normal work of breathing GI: soft, nontender, nondistended, + BS MS: no deformity or atrophy  Skin: warm and dry, no rash Neuro:  Strength and sensation are intact Psych: euthymic mood, full affect _____________  EKG:   The ekg ordered today shows NSR, 61bpm, nonspecific T wave changes  Recent Labs: 03/03/2020: ALT 19; B Natriuretic Peptide 479.8; Magnesium 2.1; TSH 7.271 03/06/2020: BUN 23; Creatinine, Ser 1.23; Hemoglobin 13.7; Platelets 238; Potassium 4.3; Sodium 129  05/25/2019: Cholesterol 156; HDL 88.10; LDL Cholesterol 64; Total CHOL/HDL Ratio 2; Triglycerides 20.0; VLDL 4.0  Estimated Creatinine Clearance: 25 mL/min (A) (by C-G formula based on SCr of 1.23 mg/dL (H)).  Wt Readings from Last 3 Encounters:  03/06/20 106 lb 4.8 oz (48.2 kg)  07/13/19 106 lb 9.6 oz (48.4 kg)  05/25/19 106 lb  (48.1 kg)    Studies reviewed:  ECHO: 03/04/2020 1. Left ventricular ejection fraction, by estimation, is 60 to 65%. The  left ventricle has normal function. The left ventricle has no regional  wall motion abnormalities. Left ventricular diastolic parameters are  indeterminate.  2. Right ventricular systolic function is normal. The right ventricular  size is normal. There is normal pulmonary artery systolic pressure.  3. Left atrial size was severely dilated.  4. Right atrial size was mild to moderately dilated.  5. The mitral valve is normal in structure. Mild mitral valve  regurgitation. No evidence of mitral stenosis.  6. Tricuspid valve regurgitation is moderate.  7. The aortic valve is tricuspid. Aortic valve regurgitation is mild.  Mild aortic valve sclerosis is present, with no evidence of aortic valve  stenosis.  8. The inferior vena cava is normal in size with greater than 50%  respiratory variability, suggesting right atrial pressure of 3 mmHg.   Coronary CT 12/2017  IMPRESSION: 1. Coronary calcium score of 127. This was 71 percentile for age and sex matched control.  2. Normal coronary origin with right dominance.  3. Mild CAD in the ostial left main and moderate CAD in the proximal LAD. Additional analysis with CT FFR will be submitted.   Electronically Signed   By: Ena Dawley   On: 01/14/2018 19:20  FFR 1. Left Main:  No significant stenosis.  2. LAD: No significant stenosis. 3. LCX: No significant stenosis. 4. RCA: No significant stenosis.  IMPRESSION: 1.  CT FFR analysis didn't show any significant stenosis.  Myoview stress test 11/2017  Nuclear stress EF: 71%.  There was no ST segment deviation noted during stress.  The study is normal.  The left ventricular ejection fraction is hyperdynamic (>65%).   1. EF 71%, normal wall motion.  2. No evidence for ischemia or infarction by perfusion images.   _____________      ASSESSMENT AND PLAN:  Paroxysmal Afib Recently admitted for syncope and rapid afib s/p unsuccessful DCCv in the ER. Had bradycardia with dilt. Started on amiodarone. Today she is in NSR. BP a little high. Since she is in sinus can stop dilt. Will provide short acting diltiazem to take as needed. Went over instructions with patient. Continue amiodarone. Baseline LFTs normal. TSH mildly elevated with normal FT4. Re-check TSH today. Discussed with patient, she does not want to check PFTs. Recommended eye exam every 6 months. CHADSVASC at least  4 (agex2, female, CAD). Continue warfarin. No bleeding issues.   Syncope Unclear etiology. Happened in the setting of afib RVR. No recurrence. Plan for 2 week live event monitor.   AKI/hyponatremia Re-check BMET. Patient reports she is eating and drinking normally  Abnormal thyroid function In the hospital TSH 7.2 with Ft4 normal. Continue to monitor with amiodarone. Re-check TSH today  Nonobstructive CAD By coronary CT in 2019. No chest pain or SOB  Hypertension BP 140/80 today. Recommended patient obtain BP cuff and take daily Bps and call back in 1 week with readings. Might be high given we are stopping diltiazem.      Disposition:   FU with 3 month wth Dr. Radford Pax   Signed, Sombrillo, PA-C 03/17/2020 10:50 AM    _____________ [facility] [address] [place] [address]  [phone] (office) [phone] (fax)

## 2020-03-18 LAB — BASIC METABOLIC PANEL
BUN/Creatinine Ratio: 19 (ref 12–28)
BUN: 18 mg/dL (ref 8–27)
CO2: 23 mmol/L (ref 20–29)
Calcium: 9.1 mg/dL (ref 8.7–10.3)
Chloride: 99 mmol/L (ref 96–106)
Creatinine, Ser: 0.97 mg/dL (ref 0.57–1.00)
GFR calc Af Amer: 61 mL/min/{1.73_m2} (ref >59)
GFR calc non Af Amer: 53 mL/min/{1.73_m2} — ABNORMAL LOW (ref >59)
Glucose: 84 mg/dL (ref 65–99)
Potassium: 4.8 mmol/L (ref 3.5–5.2)
Sodium: 136 mmol/L (ref 134–144)

## 2020-03-20 ENCOUNTER — Other Ambulatory Visit: Payer: Self-pay | Admitting: Medical

## 2020-03-20 DIAGNOSIS — R7989 Other specified abnormal findings of blood chemistry: Secondary | ICD-10-CM

## 2020-03-21 LAB — SPECIMEN STATUS REPORT

## 2020-03-21 LAB — TSH: TSH: 8.19 u[IU]/mL — ABNORMAL HIGH (ref 0.450–4.500)

## 2020-03-22 ENCOUNTER — Other Ambulatory Visit: Payer: Self-pay | Admitting: Medical

## 2020-03-22 DIAGNOSIS — R7989 Other specified abnormal findings of blood chemistry: Secondary | ICD-10-CM

## 2020-03-24 ENCOUNTER — Other Ambulatory Visit (INDEPENDENT_AMBULATORY_CARE_PROVIDER_SITE_OTHER): Payer: Medicare Other

## 2020-03-24 ENCOUNTER — Other Ambulatory Visit: Payer: Self-pay | Admitting: *Deleted

## 2020-03-24 ENCOUNTER — Ambulatory Visit (INDEPENDENT_AMBULATORY_CARE_PROVIDER_SITE_OTHER): Payer: Medicare Other | Admitting: *Deleted

## 2020-03-24 ENCOUNTER — Other Ambulatory Visit: Payer: Self-pay

## 2020-03-24 ENCOUNTER — Ambulatory Visit: Payer: Self-pay

## 2020-03-24 DIAGNOSIS — I480 Paroxysmal atrial fibrillation: Secondary | ICD-10-CM

## 2020-03-24 DIAGNOSIS — I4891 Unspecified atrial fibrillation: Secondary | ICD-10-CM | POA: Diagnosis not present

## 2020-03-24 DIAGNOSIS — H43821 Vitreomacular adhesion, right eye: Secondary | ICD-10-CM | POA: Diagnosis not present

## 2020-03-24 DIAGNOSIS — Z5181 Encounter for therapeutic drug level monitoring: Secondary | ICD-10-CM | POA: Diagnosis not present

## 2020-03-24 DIAGNOSIS — R7989 Other specified abnormal findings of blood chemistry: Secondary | ICD-10-CM | POA: Diagnosis not present

## 2020-03-24 DIAGNOSIS — R55 Syncope and collapse: Secondary | ICD-10-CM | POA: Diagnosis not present

## 2020-03-24 DIAGNOSIS — Z7901 Long term (current) use of anticoagulants: Secondary | ICD-10-CM | POA: Diagnosis not present

## 2020-03-24 LAB — POCT INR: INR: 4.7 — AB (ref 2.0–3.0)

## 2020-03-24 NOTE — Addendum Note (Signed)
Addended by: Johny Shock B on: 03/24/2020 02:32 PM   Modules accepted: Orders

## 2020-03-24 NOTE — Patient Instructions (Signed)
Description   Hold warfarin today and tomorrow then start taking 1.5 tablet daily except for 1 tablet on Monday and Fridays. Recheck INR in 1 week. AMIO 200mg  BID 03/03/20-03/13/20 then today on 03/14/2020 QD. [facility] [phone].

## 2020-03-25 DIAGNOSIS — R55 Syncope and collapse: Secondary | ICD-10-CM | POA: Diagnosis not present

## 2020-03-25 DIAGNOSIS — I480 Paroxysmal atrial fibrillation: Secondary | ICD-10-CM | POA: Diagnosis not present

## 2020-03-25 LAB — T4, FREE: Free T4: 1.25 ng/dL (ref 0.82–1.77)

## 2020-03-27 ENCOUNTER — Telehealth: Payer: Self-pay

## 2020-03-27 NOTE — Telephone Encounter (Signed)
-----   Message from Forrest, PA-C sent at 03/25/2020  8:29 AM EDT ----- Please call patient and tell her second thyroid function test Free T4 was normal . PCP can continue to monitor TSH in the future, nothing to do right now. Keep scheduled follow-up.

## 2020-03-27 NOTE — Telephone Encounter (Signed)
Attempted phone call to pt and left voicemail to contact Triage RN at [facility] at [phone]

## 2020-03-31 ENCOUNTER — Ambulatory Visit (INDEPENDENT_AMBULATORY_CARE_PROVIDER_SITE_OTHER): Payer: Medicare Other | Admitting: *Deleted

## 2020-03-31 ENCOUNTER — Other Ambulatory Visit: Payer: Self-pay

## 2020-03-31 DIAGNOSIS — I4891 Unspecified atrial fibrillation: Secondary | ICD-10-CM

## 2020-03-31 DIAGNOSIS — Z23 Encounter for immunization: Secondary | ICD-10-CM

## 2020-03-31 DIAGNOSIS — Z7901 Long term (current) use of anticoagulants: Secondary | ICD-10-CM

## 2020-03-31 DIAGNOSIS — Z961 Presence of intraocular lens: Secondary | ICD-10-CM | POA: Diagnosis not present

## 2020-03-31 LAB — POCT INR: INR: 2.1 (ref 2.0–3.0)

## 2020-03-31 NOTE — Patient Instructions (Signed)
Description   Continue taking Warfarin 1.5 tablets daily except for 1 tablet on Mondays and Fridays. Recheck INR in 1 week. AMIO 200mg  BID 03/03/20-03/13/20 then today on 03/14/2020 QD. Coumadin Clinic [phone].

## 2020-04-08 ENCOUNTER — Emergency Department (HOSPITAL_COMMUNITY): Payer: Medicare Other

## 2020-04-08 ENCOUNTER — Encounter (HOSPITAL_COMMUNITY): Payer: Self-pay | Admitting: Emergency Medicine

## 2020-04-08 ENCOUNTER — Inpatient Hospital Stay (HOSPITAL_COMMUNITY): Payer: Medicare Other

## 2020-04-08 ENCOUNTER — Other Ambulatory Visit: Payer: Self-pay

## 2020-04-08 ENCOUNTER — Inpatient Hospital Stay (HOSPITAL_COMMUNITY): Payer: Medicare Other | Admitting: Critical Care Medicine

## 2020-04-08 ENCOUNTER — Inpatient Hospital Stay (HOSPITAL_COMMUNITY)
Admission: EM | Admit: 2020-04-08 | Discharge: 2020-05-01 | DRG: 025 | Disposition: E | Payer: Medicare Other | Attending: Neurological Surgery | Admitting: Neurological Surgery

## 2020-04-08 ENCOUNTER — Encounter (HOSPITAL_COMMUNITY): Admission: EM | Disposition: E | Payer: Self-pay | Source: Home / Self Care | Attending: Neurological Surgery

## 2020-04-08 DIAGNOSIS — I6201 Nontraumatic acute subdural hemorrhage: Secondary | ICD-10-CM | POA: Diagnosis not present

## 2020-04-08 DIAGNOSIS — R4020 Unspecified coma: Secondary | ICD-10-CM | POA: Diagnosis not present

## 2020-04-08 DIAGNOSIS — S065X9A Traumatic subdural hemorrhage with loss of consciousness of unspecified duration, initial encounter: Principal | ICD-10-CM | POA: Diagnosis present

## 2020-04-08 DIAGNOSIS — R0689 Other abnormalities of breathing: Secondary | ICD-10-CM

## 2020-04-08 DIAGNOSIS — G9389 Other specified disorders of brain: Secondary | ICD-10-CM | POA: Diagnosis not present

## 2020-04-08 DIAGNOSIS — R410 Disorientation, unspecified: Secondary | ICD-10-CM | POA: Diagnosis not present

## 2020-04-08 DIAGNOSIS — G8194 Hemiplegia, unspecified affecting left nondominant side: Secondary | ICD-10-CM | POA: Diagnosis not present

## 2020-04-08 DIAGNOSIS — Z8711 Personal history of peptic ulcer disease: Secondary | ICD-10-CM

## 2020-04-08 DIAGNOSIS — I480 Paroxysmal atrial fibrillation: Secondary | ICD-10-CM | POA: Diagnosis present

## 2020-04-08 DIAGNOSIS — I081 Rheumatic disorders of both mitral and tricuspid valves: Secondary | ICD-10-CM | POA: Diagnosis present

## 2020-04-08 DIAGNOSIS — I619 Nontraumatic intracerebral hemorrhage, unspecified: Secondary | ICD-10-CM | POA: Diagnosis not present

## 2020-04-08 DIAGNOSIS — J939 Pneumothorax, unspecified: Secondary | ICD-10-CM

## 2020-04-08 DIAGNOSIS — I700 Atherosclerosis of aorta: Secondary | ICD-10-CM | POA: Diagnosis not present

## 2020-04-08 DIAGNOSIS — Z515 Encounter for palliative care: Secondary | ICD-10-CM

## 2020-04-08 DIAGNOSIS — J9601 Acute respiratory failure with hypoxia: Secondary | ICD-10-CM | POA: Diagnosis not present

## 2020-04-08 DIAGNOSIS — Z7901 Long term (current) use of anticoagulants: Secondary | ICD-10-CM | POA: Diagnosis not present

## 2020-04-08 DIAGNOSIS — M810 Age-related osteoporosis without current pathological fracture: Secondary | ICD-10-CM | POA: Diagnosis present

## 2020-04-08 DIAGNOSIS — E440 Moderate protein-calorie malnutrition: Secondary | ICD-10-CM | POA: Diagnosis not present

## 2020-04-08 DIAGNOSIS — L8990 Pressure ulcer of unspecified site, unspecified stage: Secondary | ICD-10-CM | POA: Insufficient documentation

## 2020-04-08 DIAGNOSIS — I2510 Atherosclerotic heart disease of native coronary artery without angina pectoris: Secondary | ICD-10-CM | POA: Diagnosis not present

## 2020-04-08 DIAGNOSIS — M47814 Spondylosis without myelopathy or radiculopathy, thoracic region: Secondary | ICD-10-CM | POA: Diagnosis not present

## 2020-04-08 DIAGNOSIS — G935 Compression of brain: Secondary | ICD-10-CM | POA: Diagnosis not present

## 2020-04-08 DIAGNOSIS — R64 Cachexia: Secondary | ICD-10-CM | POA: Diagnosis present

## 2020-04-08 DIAGNOSIS — I6200 Nontraumatic subdural hemorrhage, unspecified: Secondary | ICD-10-CM | POA: Diagnosis not present

## 2020-04-08 DIAGNOSIS — Z8249 Family history of ischemic heart disease and other diseases of the circulatory system: Secondary | ICD-10-CM | POA: Diagnosis not present

## 2020-04-08 DIAGNOSIS — Z20822 Contact with and (suspected) exposure to covid-19: Secondary | ICD-10-CM | POA: Diagnosis not present

## 2020-04-08 DIAGNOSIS — Z681 Body mass index (BMI) 19 or less, adult: Secondary | ICD-10-CM | POA: Diagnosis not present

## 2020-04-08 DIAGNOSIS — I4891 Unspecified atrial fibrillation: Secondary | ICD-10-CM | POA: Diagnosis not present

## 2020-04-08 DIAGNOSIS — Z66 Do not resuscitate: Secondary | ICD-10-CM | POA: Diagnosis not present

## 2020-04-08 DIAGNOSIS — Z4659 Encounter for fitting and adjustment of other gastrointestinal appliance and device: Secondary | ICD-10-CM

## 2020-04-08 DIAGNOSIS — Z79899 Other long term (current) drug therapy: Secondary | ICD-10-CM | POA: Diagnosis not present

## 2020-04-08 DIAGNOSIS — E785 Hyperlipidemia, unspecified: Secondary | ICD-10-CM | POA: Diagnosis not present

## 2020-04-08 DIAGNOSIS — R54 Age-related physical debility: Secondary | ICD-10-CM | POA: Diagnosis present

## 2020-04-08 DIAGNOSIS — I10 Essential (primary) hypertension: Secondary | ICD-10-CM | POA: Diagnosis present

## 2020-04-08 DIAGNOSIS — I672 Cerebral atherosclerosis: Secondary | ICD-10-CM | POA: Diagnosis not present

## 2020-04-08 DIAGNOSIS — Z4682 Encounter for fitting and adjustment of non-vascular catheter: Secondary | ICD-10-CM | POA: Diagnosis not present

## 2020-04-08 DIAGNOSIS — R4182 Altered mental status, unspecified: Secondary | ICD-10-CM | POA: Diagnosis not present

## 2020-04-08 DIAGNOSIS — K219 Gastro-esophageal reflux disease without esophagitis: Secondary | ICD-10-CM | POA: Diagnosis present

## 2020-04-08 DIAGNOSIS — R0902 Hypoxemia: Secondary | ICD-10-CM | POA: Diagnosis not present

## 2020-04-08 DIAGNOSIS — S065XAA Traumatic subdural hemorrhage with loss of consciousness status unknown, initial encounter: Secondary | ICD-10-CM | POA: Diagnosis present

## 2020-04-08 DIAGNOSIS — G4489 Other headache syndrome: Secondary | ICD-10-CM | POA: Diagnosis not present

## 2020-04-08 DIAGNOSIS — Z9911 Dependence on respirator [ventilator] status: Secondary | ICD-10-CM | POA: Diagnosis not present

## 2020-04-08 DIAGNOSIS — Z7401 Bed confinement status: Secondary | ICD-10-CM | POA: Diagnosis not present

## 2020-04-08 DIAGNOSIS — R404 Transient alteration of awareness: Secondary | ICD-10-CM | POA: Diagnosis not present

## 2020-04-08 DIAGNOSIS — Z452 Encounter for adjustment and management of vascular access device: Secondary | ICD-10-CM | POA: Diagnosis not present

## 2020-04-08 DIAGNOSIS — S065X0A Traumatic subdural hemorrhage without loss of consciousness, initial encounter: Secondary | ICD-10-CM | POA: Diagnosis not present

## 2020-04-08 HISTORY — PX: CRANIOTOMY: SHX93

## 2020-04-08 LAB — POCT I-STAT 7, (LYTES, BLD GAS, ICA,H+H)
Acid-Base Excess: 2 mmol/L (ref 0.0–2.0)
Bicarbonate: 26.3 mmol/L (ref 20.0–28.0)
Calcium, Ion: 1.09 mmol/L — ABNORMAL LOW (ref 1.15–1.40)
HCT: 30 % — ABNORMAL LOW (ref 36.0–46.0)
Hemoglobin: 10.2 g/dL — ABNORMAL LOW (ref 12.0–15.0)
O2 Saturation: 100 %
Patient temperature: 97.8
Potassium: 3.6 mmol/L (ref 3.5–5.1)
Sodium: 138 mmol/L (ref 135–145)
TCO2: 27 mmol/L (ref 22–32)
pCO2 arterial: 38.3 mmHg (ref 32.0–48.0)
pH, Arterial: 7.442 (ref 7.350–7.450)
pO2, Arterial: 451 mmHg — ABNORMAL HIGH (ref 83.0–108.0)

## 2020-04-08 LAB — CBC
HCT: 30.6 % — ABNORMAL LOW (ref 36.0–46.0)
Hemoglobin: 10.2 g/dL — ABNORMAL LOW (ref 12.0–15.0)
MCH: 31 pg (ref 26.0–34.0)
MCHC: 33.3 g/dL (ref 30.0–36.0)
MCV: 93 fL (ref 80.0–100.0)
Platelets: 153 10*3/uL (ref 150–400)
RBC: 3.29 MIL/uL — ABNORMAL LOW (ref 3.87–5.11)
RDW: 13.4 % (ref 11.5–15.5)
WBC: 7.5 10*3/uL (ref 4.0–10.5)
nRBC: 0 % (ref 0.0–0.2)

## 2020-04-08 LAB — COMPREHENSIVE METABOLIC PANEL
ALT: 37 U/L (ref 0–44)
AST: 51 U/L — ABNORMAL HIGH (ref 15–41)
Albumin: 4.3 g/dL (ref 3.5–5.0)
Alkaline Phosphatase: 46 U/L (ref 38–126)
Anion gap: 14 (ref 5–15)
BUN: 16 mg/dL (ref 8–23)
CO2: 23 mmol/L (ref 22–32)
Calcium: 9.1 mg/dL (ref 8.9–10.3)
Chloride: 99 mmol/L (ref 98–111)
Creatinine, Ser: 0.96 mg/dL (ref 0.44–1.00)
GFR, Estimated: 54 mL/min — ABNORMAL LOW (ref >60)
Glucose, Bld: 135 mg/dL — ABNORMAL HIGH (ref 70–99)
Potassium: 3.6 mmol/L (ref 3.5–5.1)
Sodium: 136 mmol/L (ref 135–145)
Total Bilirubin: 0.6 mg/dL (ref 0.3–1.2)
Total Protein: 7 g/dL (ref 6.5–8.1)

## 2020-04-08 LAB — CBC WITH DIFFERENTIAL/PLATELET
Abs Immature Granulocytes: 0.06 10*3/uL (ref 0.00–0.07)
Basophils Absolute: 0.1 10*3/uL (ref 0.0–0.1)
Basophils Relative: 1 %
Eosinophils Absolute: 0.2 10*3/uL (ref 0.0–0.5)
Eosinophils Relative: 2 %
HCT: 40 % (ref 36.0–46.0)
Hemoglobin: 13.1 g/dL (ref 12.0–15.0)
Immature Granulocytes: 1 %
Lymphocytes Relative: 8 %
Lymphs Abs: 0.8 10*3/uL (ref 0.7–4.0)
MCH: 30.2 pg (ref 26.0–34.0)
MCHC: 32.8 g/dL (ref 30.0–36.0)
MCV: 92.2 fL (ref 80.0–100.0)
Monocytes Absolute: 0.7 10*3/uL (ref 0.1–1.0)
Monocytes Relative: 6 %
Neutro Abs: 8.6 10*3/uL — ABNORMAL HIGH (ref 1.7–7.7)
Neutrophils Relative %: 82 %
Platelets: 191 10*3/uL (ref 150–400)
RBC: 4.34 MIL/uL (ref 3.87–5.11)
RDW: 13.5 % (ref 11.5–15.5)
WBC: 10.4 10*3/uL (ref 4.0–10.5)
nRBC: 0 % (ref 0.0–0.2)

## 2020-04-08 LAB — I-STAT ARTERIAL BLOOD GAS, ED
Acid-Base Excess: 8 mmol/L — ABNORMAL HIGH (ref 0.0–2.0)
Bicarbonate: 30.9 mmol/L — ABNORMAL HIGH (ref 20.0–28.0)
Calcium, Ion: 1.12 mmol/L — ABNORMAL LOW (ref 1.15–1.40)
HCT: 41 % (ref 36.0–46.0)
Hemoglobin: 13.9 g/dL (ref 12.0–15.0)
O2 Saturation: 100 %
Patient temperature: 96.3
Potassium: 3.1 mmol/L — ABNORMAL LOW (ref 3.5–5.1)
Sodium: 136 mmol/L (ref 135–145)
TCO2: 32 mmol/L (ref 22–32)
pCO2 arterial: 35.3 mmHg (ref 32.0–48.0)
pH, Arterial: 7.546 — ABNORMAL HIGH (ref 7.350–7.450)
pO2, Arterial: 349 mmHg — ABNORMAL HIGH (ref 83.0–108.0)

## 2020-04-08 LAB — AMMONIA: Ammonia: 20 umol/L (ref 9–35)

## 2020-04-08 LAB — RESPIRATORY PANEL BY RT PCR (FLU A&B, COVID)
Influenza A by PCR: NEGATIVE
Influenza B by PCR: NEGATIVE
SARS Coronavirus 2 by RT PCR: NEGATIVE

## 2020-04-08 LAB — PROTIME-INR
INR: 2.8 — ABNORMAL HIGH (ref 0.8–1.2)
INR: 1.2 (ref 0.8–1.2)
INR: 1.3 — ABNORMAL HIGH (ref 0.8–1.2)
INR: 1.2 (ref 0.8–1.2)
Prothrombin Time: 28.8 seconds — ABNORMAL HIGH (ref 11.4–15.2)
Prothrombin Time: 14.4 seconds (ref 11.4–15.2)
Prothrombin Time: 16.1 seconds — ABNORMAL HIGH (ref 11.4–15.2)
Prothrombin Time: 14.5 seconds (ref 11.4–15.2)

## 2020-04-08 LAB — MRSA PCR SCREENING: MRSA by PCR: NEGATIVE

## 2020-04-08 LAB — TYPE AND SCREEN
ABO/RH(D): A POS
Antibody Screen: NEGATIVE

## 2020-04-08 LAB — CBG MONITORING, ED: Glucose-Capillary: 126 mg/dL — ABNORMAL HIGH (ref 70–99)

## 2020-04-08 SURGERY — CRANIOTOMY HEMATOMA EVACUATION SUBDURAL
Anesthesia: General | Site: Head | Laterality: Left

## 2020-04-08 MED ORDER — BACITRACIN ZINC 500 UNIT/GM EX OINT
TOPICAL_OINTMENT | CUTANEOUS | Status: DC | PRN
  Administered 2020-04-08: 1 via TOPICAL

## 2020-04-08 MED ORDER — PROPOFOL 1000 MG/100ML IV EMUL
5.0000 ug/kg/min | INTRAVENOUS | Status: DC
  Administered 2020-04-08: 20 ug/kg/min via INTRAVENOUS

## 2020-04-08 MED ORDER — FENTANYL CITRATE (PF) 100 MCG/2ML IJ SOLN: 25.0000 ug | INTRAMUSCULAR | Status: DC | PRN

## 2020-04-08 MED ORDER — CEFAZOLIN SODIUM-DEXTROSE 1-4 GM/50ML-% IV SOLN
INTRAVENOUS | Status: DC | PRN
  Administered 2020-04-08: 1 g via INTRAVENOUS

## 2020-04-08 MED ORDER — PROMETHAZINE HCL 25 MG PO TABS: 12.5000 mg | ORAL_TABLET | ORAL | Status: DC | PRN

## 2020-04-08 MED ORDER — EPHEDRINE SULFATE-NACL 50-0.9 MG/10ML-% IV SOSY
PREFILLED_SYRINGE | INTRAVENOUS | Status: DC | PRN
  Administered 2020-04-08: 10 mg via INTRAVENOUS

## 2020-04-08 MED ORDER — VITAMIN K1 10 MG/ML IJ SOLN
10.0000 mg | Freq: Once | INTRAVENOUS | Status: AC
  Administered 2020-04-08: 10 mg via INTRAVENOUS
  Filled 2020-04-08: qty 1

## 2020-04-08 MED ORDER — ACETAMINOPHEN 325 MG PO TABS: 650.0000 mg | ORAL_TABLET | ORAL | Status: DC | PRN

## 2020-04-08 MED ORDER — LIDOCAINE-EPINEPHRINE 1 %-1:100000 IJ SOLN
INTRAMUSCULAR | Status: AC
  Filled 2020-04-08: qty 1

## 2020-04-08 MED ORDER — THROMBIN 20000 UNITS EX SOLR
CUTANEOUS | Status: AC
  Filled 2020-04-08: qty 20000

## 2020-04-08 MED ORDER — HYDROCODONE-ACETAMINOPHEN 5-325 MG PO TABS: 1.0000 | ORAL_TABLET | ORAL | Status: DC | PRN

## 2020-04-08 MED ORDER — ROCURONIUM BROMIDE 100 MG/10ML IV SOLN
INTRAVENOUS | Status: DC | PRN
  Administered 2020-04-08: 50 mg via INTRAVENOUS

## 2020-04-08 MED ORDER — CHLORHEXIDINE GLUCONATE 0.12% ORAL RINSE (MEDLINE KIT)
15.0000 mL | Freq: Two times a day (BID) | OROMUCOSAL | Status: DC
  Administered 2020-04-08 – 2020-04-23 (×28): 15 mL via OROMUCOSAL

## 2020-04-08 MED ORDER — SODIUM CHLORIDE 0.9 % IV SOLN: INTRAVENOUS | Status: DC | PRN

## 2020-04-08 MED ORDER — THROMBIN 5000 UNITS EX SOLR
OROMUCOSAL | Status: DC | PRN
  Administered 2020-04-08: 5 mL via TOPICAL

## 2020-04-08 MED ORDER — AMIODARONE HCL 200 MG PO TABS
200.0000 mg | ORAL_TABLET | Freq: Every day | ORAL | Status: DC
  Administered 2020-04-08: 200 mg via ORAL
  Filled 2020-04-08: qty 1

## 2020-04-08 MED ORDER — DOCUSATE SODIUM 100 MG PO CAPS: 100.0000 mg | ORAL_CAPSULE | Freq: Two times a day (BID) | ORAL | Status: DC

## 2020-04-08 MED ORDER — DILTIAZEM HCL 30 MG PO TABS
30.0000 mg | ORAL_TABLET | Freq: Two times a day (BID) | ORAL | Status: DC | PRN
  Filled 2020-04-08: qty 1

## 2020-04-08 MED ORDER — FENTANYL CITRATE (PF) 250 MCG/5ML IJ SOLN
INTRAMUSCULAR | Status: DC | PRN
  Administered 2020-04-08 (×3): 50 ug via INTRAVENOUS

## 2020-04-08 MED ORDER — HEPARIN SODIUM (PORCINE) 5000 UNIT/ML IJ SOLN
5000.0000 [IU] | Freq: Three times a day (TID) | INTRAMUSCULAR | Status: DC
  Administered 2020-04-10 – 2020-04-17 (×23): 5000 [IU] via SUBCUTANEOUS
  Filled 2020-04-08 (×23): qty 1

## 2020-04-08 MED ORDER — 0.9 % SODIUM CHLORIDE (POUR BTL) OPTIME
TOPICAL | Status: DC | PRN
  Administered 2020-04-08 (×2): 1000 mL

## 2020-04-08 MED ORDER — ATORVASTATIN CALCIUM 10 MG PO TABS
20.0000 mg | ORAL_TABLET | Freq: Every day | ORAL | Status: DC
  Administered 2020-04-08: 20 mg via ORAL
  Filled 2020-04-08: qty 2

## 2020-04-08 MED ORDER — ALBUMIN HUMAN 5 % IV SOLN: INTRAVENOUS | Status: DC | PRN

## 2020-04-08 MED ORDER — FENTANYL CITRATE (PF) 250 MCG/5ML IJ SOLN
INTRAMUSCULAR | Status: AC
  Filled 2020-04-08: qty 5

## 2020-04-08 MED ORDER — PROPOFOL 10 MG/ML IV BOLUS
INTRAVENOUS | Status: AC
  Filled 2020-04-08: qty 20

## 2020-04-08 MED ORDER — PHENYLEPHRINE HCL-NACL 10-0.9 MG/250ML-% IV SOLN
INTRAVENOUS | Status: DC | PRN
  Administered 2020-04-08 (×2): 10 ug/min via INTRAVENOUS
  Administered 2020-04-08: 25 ug/min via INTRAVENOUS

## 2020-04-08 MED ORDER — CEFAZOLIN SODIUM-DEXTROSE 2-4 GM/100ML-% IV SOLN
2.0000 g | Freq: Three times a day (TID) | INTRAVENOUS | Status: AC
  Administered 2020-04-08 (×2): 2 g via INTRAVENOUS
  Filled 2020-04-08 (×2): qty 100

## 2020-04-08 MED ORDER — PROTHROMBIN COMPLEX CONC HUMAN 500 UNITS IV KIT
1613.0000 [IU] | PACK | Status: AC
  Administered 2020-04-08: 1613 [IU] via INTRAVENOUS
  Filled 2020-04-08: qty 1613

## 2020-04-08 MED ORDER — HYDROMORPHONE HCL 1 MG/ML IJ SOLN
0.5000 mg | INTRAMUSCULAR | Status: DC | PRN
  Administered 2020-04-21: 0.5 mg via INTRAVENOUS
  Filled 2020-04-08: qty 1

## 2020-04-08 MED ORDER — CHLORHEXIDINE GLUCONATE CLOTH 2 % EX PADS
6.0000 | MEDICATED_PAD | Freq: Every day | CUTANEOUS | Status: DC
  Administered 2020-04-08 – 2020-04-17 (×10): 6 via TOPICAL

## 2020-04-08 MED ORDER — PROPOFOL 1000 MG/100ML IV EMUL
5.0000 ug/kg/min | INTRAVENOUS | Status: DC
  Administered 2020-04-08: 15 ug/kg/min via INTRAVENOUS
  Filled 2020-04-08: qty 100

## 2020-04-08 MED ORDER — POLYETHYLENE GLYCOL 3350 17 G PO PACK: 17.0000 g | PACK | Freq: Every day | ORAL | Status: DC | PRN

## 2020-04-08 MED ORDER — VITAMIN K1 10 MG/ML IJ SOLN
10.0000 mg | INTRAVENOUS | Status: AC
  Administered 2020-04-08: 10 mg via INTRAVENOUS
  Filled 2020-04-08: qty 1

## 2020-04-08 MED ORDER — ORAL CARE MOUTH RINSE
15.0000 mL | OROMUCOSAL | Status: DC
  Administered 2020-04-08 – 2020-04-17 (×90): 15 mL via OROMUCOSAL

## 2020-04-08 MED ORDER — ONDANSETRON HCL 4 MG PO TABS: 4.0000 mg | ORAL_TABLET | ORAL | Status: DC | PRN

## 2020-04-08 MED ORDER — ACETAMINOPHEN 650 MG RE SUPP: 650.0000 mg | RECTAL | Status: DC | PRN

## 2020-04-08 MED ORDER — LIDOCAINE-EPINEPHRINE 1 %-1:100000 IJ SOLN
INTRAMUSCULAR | Status: DC | PRN
  Administered 2020-04-08: 7 mL via INTRADERMAL

## 2020-04-08 MED ORDER — BACITRACIN ZINC 500 UNIT/GM EX OINT
TOPICAL_OINTMENT | CUTANEOUS | Status: AC
  Filled 2020-04-08: qty 28.35

## 2020-04-08 MED ORDER — PHENYLEPHRINE 40 MCG/ML (10ML) SYRINGE FOR IV PUSH (FOR BLOOD PRESSURE SUPPORT)
PREFILLED_SYRINGE | INTRAVENOUS | Status: DC | PRN
  Administered 2020-04-08 (×2): 80 ug via INTRAVENOUS

## 2020-04-08 MED ORDER — PROPOFOL 1000 MG/100ML IV EMUL
INTRAVENOUS | Status: AC
  Administered 2020-04-08: 20 ug/kg/min via INTRAVENOUS
  Filled 2020-04-08: qty 100

## 2020-04-08 MED ORDER — THROMBIN 5000 UNITS EX SOLR
CUTANEOUS | Status: AC
  Filled 2020-04-08: qty 5000

## 2020-04-08 MED ORDER — THROMBIN 20000 UNITS EX SOLR
CUTANEOUS | Status: DC | PRN
  Administered 2020-04-08: 20 mL via TOPICAL

## 2020-04-08 MED ORDER — CLEVIDIPINE BUTYRATE 0.5 MG/ML IV EMUL
0.0000 mg/h | INTRAVENOUS | Status: DC
  Administered 2020-04-08 – 2020-04-09 (×2): 1 mg/h via INTRAVENOUS
  Filled 2020-04-08 (×3): qty 50

## 2020-04-08 MED ORDER — LABETALOL HCL 5 MG/ML IV SOLN
10.0000 mg | INTRAVENOUS | Status: DC | PRN
  Administered 2020-04-09: 20 mg via INTRAVENOUS
  Filled 2020-04-08: qty 4

## 2020-04-08 MED ORDER — ONDANSETRON HCL 4 MG/2ML IJ SOLN: 4.0000 mg | INTRAMUSCULAR | Status: DC | PRN

## 2020-04-08 SURGICAL SUPPLY — 72 items
BLADE CLIPPER SURG (BLADE) ×3 IMPLANT
BNDG GAUZE ELAST 4 BULKY (GAUZE/BANDAGES/DRESSINGS) IMPLANT
BUR ACORN 9.0 PRECISION (BURR) ×2 IMPLANT
BUR ACORN 9.0MM PRECISION (BURR) ×1
BUR MATCHSTICK NEURO 3.0 LAGG (BURR) IMPLANT
BUR SPIRAL ROUTER 2.3 (BUR) ×2 IMPLANT
BUR SPIRAL ROUTER 2.3MM (BUR) ×1
CANISTER SUCT 3000ML PPV (MISCELLANEOUS) ×3 IMPLANT
CATH VENTRIC 35X38 W/TROCAR LG (CATHETERS) ×2 IMPLANT
CLIP VESOCCLUDE MED 6/CT (CLIP) IMPLANT
COVER BURR HOLE 7 (Orthopedic Implant) ×4 IMPLANT
COVER WAND RF STERILE (DRAPES) ×3 IMPLANT
DRAPE NEUROLOGICAL W/INCISE (DRAPES) ×3 IMPLANT
DRAPE SHEET LG 3/4 BI-LAMINATE (DRAPES) ×3 IMPLANT
DRAPE SURG 17X23 STRL (DRAPES) IMPLANT
DRAPE WARM FLUID 44X44 (DRAPES) ×3 IMPLANT
DURAPREP 6ML APPLICATOR 50/CS (WOUND CARE) ×3 IMPLANT
ELECT REM PT RETURN 9FT ADLT (ELECTROSURGICAL) ×3
ELECTRODE REM PT RTRN 9FT ADLT (ELECTROSURGICAL) ×1 IMPLANT
EVACUATOR 1/8 PVC DRAIN (DRAIN) IMPLANT
EVACUATOR SILICONE 100CC (DRAIN) IMPLANT
GAUZE 4X4 16PLY RFD (DISPOSABLE) IMPLANT
GAUZE SPONGE 4X4 12PLY STRL (GAUZE/BANDAGES/DRESSINGS) ×3 IMPLANT
GLOVE BIO SURGEON STRL SZ 6.5 (GLOVE) ×4 IMPLANT
GLOVE BIO SURGEON STRL SZ7 (GLOVE) ×2 IMPLANT
GLOVE BIO SURGEON STRL SZ7.5 (GLOVE) ×6 IMPLANT
GLOVE BIO SURGEONS STRL SZ 6.5 (GLOVE) ×3
GLOVE BIOGEL PI IND STRL 6.5 (GLOVE) ×1 IMPLANT
GLOVE BIOGEL PI IND STRL 7.5 (GLOVE) ×2 IMPLANT
GLOVE BIOGEL PI INDICATOR 6.5 (GLOVE) ×2
GLOVE BIOGEL PI INDICATOR 7.5 (GLOVE) ×4
GLOVE EXAM NITRILE LRG STRL (GLOVE) IMPLANT
GLOVE EXAM NITRILE XL STR (GLOVE) IMPLANT
GLOVE EXAM NITRILE XS STR PU (GLOVE) IMPLANT
GOWN STRL REUS W/ TWL LRG LVL3 (GOWN DISPOSABLE) ×2 IMPLANT
GOWN STRL REUS W/ TWL XL LVL3 (GOWN DISPOSABLE) IMPLANT
GOWN STRL REUS W/TWL 2XL LVL3 (GOWN DISPOSABLE) IMPLANT
GOWN STRL REUS W/TWL LRG LVL3 (GOWN DISPOSABLE) ×12
GOWN STRL REUS W/TWL XL LVL3 (GOWN DISPOSABLE)
HEMOSTAT POWDER KIT SURGIFOAM (HEMOSTASIS) ×3 IMPLANT
HEMOSTAT SURGICEL 2X14 (HEMOSTASIS) IMPLANT
KIT BASIN OR (CUSTOM PROCEDURE TRAY) ×3 IMPLANT
KIT TURNOVER KIT B (KITS) ×3 IMPLANT
NEEDLE HYPO 22GX1.5 SAFETY (NEEDLE) ×3 IMPLANT
NS IRRIG 1000ML POUR BTL (IV SOLUTION) ×5 IMPLANT
PACK CRANIOTOMY CUSTOM (CUSTOM PROCEDURE TRAY) ×3 IMPLANT
PATTIES SURGICAL .5 X.5 (GAUZE/BANDAGES/DRESSINGS) IMPLANT
PATTIES SURGICAL .5 X3 (DISPOSABLE) IMPLANT
PATTIES SURGICAL 1X1 (DISPOSABLE) IMPLANT
PLATE DOUBLE Y CMF 6H (Plate) ×2 IMPLANT
SCREW UNIII AXS SD 1.5X4 (Screw) ×28 IMPLANT
SPONGE NEURO XRAY DETECT 1X3 (DISPOSABLE) IMPLANT
SPONGE SURGIFOAM ABS GEL 100 (HEMOSTASIS) ×3 IMPLANT
STAPLER VISISTAT 35W (STAPLE) ×3 IMPLANT
STOCKINETTE 6  STRL (DRAPES)
STOCKINETTE 6 STRL (DRAPES) ×1 IMPLANT
SUT ETHILON 3 0 FSL (SUTURE) IMPLANT
SUT ETHILON 3 0 PS 1 (SUTURE) IMPLANT
SUT MON AB 3-0 SH 27 (SUTURE) ×6
SUT MON AB 3-0 SH27 (SUTURE) IMPLANT
SUT NURALON 4 0 TR CR/8 (SUTURE) ×5 IMPLANT
SUT VIC AB 0 CT1 18XCR BRD8 (SUTURE) ×2 IMPLANT
SUT VIC AB 0 CT1 8-18 (SUTURE)
SUT VIC AB 2-0 CP2 18 (SUTURE) ×6 IMPLANT
SUT VIC AB 3-0 SH 8-18 (SUTURE) ×2 IMPLANT
TOWEL GREEN STERILE (TOWEL DISPOSABLE) ×3 IMPLANT
TOWEL GREEN STERILE FF (TOWEL DISPOSABLE) ×3 IMPLANT
TRAY FOLEY MTR SLVR 16FR STAT (SET/KITS/TRAYS/PACK) ×3 IMPLANT
TUBE CONNECTING 12'X1/4 (SUCTIONS) ×1
TUBE CONNECTING 12X1/4 (SUCTIONS) ×2 IMPLANT
UNDERPAD 30X36 HEAVY ABSORB (UNDERPADS AND DIAPERS) ×3 IMPLANT
WATER STERILE IRR 1000ML POUR (IV SOLUTION) ×3 IMPLANT

## 2020-04-08 NOTE — Code Documentation (Signed)
80mg  of roc given by this rn

## 2020-04-08 NOTE — Code Documentation (Addendum)
Pt unresponsive, vomiting and dry heaving at bedside. Dr Christy Gentles at bedside for intubation, 10mcg ectomidate and 43mcg roc verbal order at this time. Respiratory at bedside

## 2020-04-08 NOTE — Progress Notes (Signed)
NAME:  Cynthia Vargas, MRN:  [id number], DOB:  March 20, 1932, LOS: 0 ADMISSION DATE:  04/18/2020, CONSULTATION DATE:  04/29/2020 REFERRING MD: Zada Finders , CHIEF COMPLAINT: Altered mental status   Brief History   86 year old normally on Coumadin for paroxysmal atrial fibrillation who presented with altered mental status and a very large left subdural hematoma  History of present illness   Patient apparently complained of a headache and then became unresponsive.  She was brought to the department of emergency medicine she was posturing.  She was intubated for decreased mental status and a CT scan of the head obtained which showed a large left subdural hematoma with substantial midline shift.  She was subsequently taken for a craniotomy after reversal of her Coumadin and arrives in the intensive care unit intubated sedated and mechanically ventilated.  We can obtain no history from the patient.  Past Medical History  Chart indicates a history of hyperlipidemia, coronary artery disease, distant history of pericarditis, history of hyperlipidemia, and a history of GERD. Surgical history is remarkable for cholecystectomy ORIF of the right hip, tonsillectomy, and lung mass biopsy.  Significant Hospital Events     Consults:    Procedures:  Craniotomy on 10/9  Significant Diagnostic Tests:    Micro Data:    Antimicrobials:     Interim history/subjective:  As above  Objective   Blood pressure 125/72, pulse 66, temperature 97.8 F (36.6 C), temperature source Axillary, resp. rate (!) 24, height 5\' 2"  (1.575 m), weight 49 kg, SpO2 100 %.    Vent Mode: PRVC FiO2 (%):  [40 %-100 %] 40 % Set Rate:  [18 bmp-20 bmp] 18 bmp Vt Set:  [400 mL] 400 mL PEEP:  [5 cmH20] 5 cmH20 Plateau Pressure:  [15 cmH20-20 cmH20] 15 cmH20   Intake/Output Summary (Last 24 hours) at 04/17/2020 1616 Last data filed at 04/28/2020 1600 Gross per 24 hour  Intake 1881.14 ml  Output 1205 ml  Net 676.14 ml    Filed Weights   04/07/2020 0300  Weight: 49 kg    Examination: General: Somewhat cachectic appearing elderly female who is intubated and sedated on propofol HENT:  Lungs: Respirations are unlabored there is symmetric air movement a few scattered rhonchi and no wheezes Cardiovascular: 1 and S2 are currently regular without murmur rub or gallop Abdomen: Abdomen is soft without overt masses tenderness guarding or rebound Extremities: Limbs are warm without edema Neuro: There is no response to voice.  Face is symmetric pupils are equal.  In response to noxious stimuli she seemed to be semipurposeful with the right upper extremity and extends the left upper extremity GU:   Resolved Hospital Problem list     Assessment & Plan:  This is an 86 year old normally on Coumadin for history of paroxysmal atrial fibrillation who presented with altered mental status and the finding of a very large left subdural hematoma.  She is status post craniotomy for evacuation.  Post procedure CT scan shows resolution of the midline shift with substantial pneumocephalus.  Her Coumadin was reversed with [facility] but I do not see that she received a dose of vitamin K. Supportably vitamin K has been administered, and will be ventilating her on 90% oxygen in the hope of resorbing some of her pneumocephalus.  Extubation is currently precluded by her mental status.  Best practice:  Diet: Feeding to be initiated Pain/Anxiety/Delirium protocol (if indicated):  VAP protocol (if indicated): In place DVT prophylaxis:  GI prophylaxis: Pepcid has been ordered Glucose control: No  hypoglycemic agent indicated Mobility: Bedrest Code Status:  Family Communication: I am yet to meet family. Disposition:   Labs   CBC: Recent Labs  Lab 04/07/2020 0226 04/05/2020 0331 04/14/2020 0902  WBC 10.4  --  7.5  NEUTROABS 8.6*  --   --   HGB 13.1 13.9 10.2*  HCT 40.0 41.0 30.6*  MCV 92.2  --  93.0  PLT 191  --  009    Basic Metabolic  Panel: Recent Labs  Lab 04/30/2020 0226 04/17/2020 0331  NA 136 136  K 3.6 3.1*  CL 99  --   CO2 23  --   GLUCOSE 135*  --   BUN 16  --   CREATININE 0.96  --   CALCIUM 9.1  --    GFR: Estimated Creatinine Clearance: 32.5 mL/min (by C-G formula based on SCr of 0.96 mg/dL). Recent Labs  Lab 04/29/2020 0226 04/05/2020 0902  WBC 10.4 7.5    Liver Function Tests: Recent Labs  Lab 04/22/2020 0226  AST 51*  ALT 37  ALKPHOS 46  BILITOT 0.6  PROT 7.0  ALBUMIN 4.3   No results for input(s): LIPASE, AMYLASE in the last 168 hours. Recent Labs  Lab 04/10/2020 0226  AMMONIA 20    ABG    Component Value Date/Time   PHART 7.546 (H) 04/29/2020 0331   PCO2ART 35.3 04/02/2020 0331   PO2ART 349 (H) 04/19/2020 0331   HCO3 30.9 (H) 03/31/2020 0331   TCO2 32 04/22/2020 0331   O2SAT 100.0 04/10/2020 0331     Coagulation Profile: Recent Labs  Lab 04/18/2020 0226 04/26/2020 0534 04/07/2020 0902  INR 2.8* 1.2 1.3*    Cardiac Enzymes: No results for input(s): CKTOTAL, CKMB, CKMBINDEX, TROPONINI in the last 168 hours.  HbA1C: Hgb A1c MFr Bld  Date/Time Value Ref Range Status  09/18/2015 10:39 AM 5.7 (H) <5.7 % Final    Comment:                                                                           According to the ADA Clinical Practice Recommendations for 2011, when HbA1c is used as a screening test:     >=6.5%   Diagnostic of Diabetes Mellitus            (if abnormal result is confirmed)   5.7-6.4%   Increased risk of developing Diabetes Mellitus   References:Diagnosis and Classification of Diabetes Mellitus,Diabetes QZRA,0762,26(JFHLK 1):S62-S69 and Standards of Medical Care in         Diabetes - 2011,Diabetes TGYB,6389,37 (Suppl 1):S11-S61.       CBG: Recent Labs  Lab 04/26/2020 0223  GLUCAP 126*    Review of Systems:   Not obtainable  Past Medical History  She,  has a past medical history of Acute pericarditis (11/01/2015), CALCANEAL FRACTURE, RIGHT (11/04/2007),  Essential hypertension, GERD (gastroesophageal reflux disease), Hyperlipidemia LDL goal <70 (02/08/2016), KNEE PAIN, RIGHT, CHRONIC (03/19/2010), Lung nodule, Mild Aortic insufficiency, Mild Mitral regurgitation, Moderate tricuspid regurgitation, Nonobstructive CAD (coronary artery disease), OSTEOPOROSIS (05/02/2007), PAF (paroxysmal atrial fibrillation) ([place]), PEPTIC ULCER DISEASE (05/02/2007), and Syncope.   Surgical History    Past Surgical History:  Procedure Laterality Date  . Calcaneal fracture, right    .  CHOLECYSTECTOMY    . DILATION AND CURETTAGE OF UTERUS    . ESOPHAGOGASTRODUODENOSCOPY N/A 03/30/2013   Procedure: ESOPHAGOGASTRODUODENOSCOPY (EGD);  Surgeon: Jerene Bears, MD;  Location: [facility];  Service: Endoscopy;  Laterality: N/A;  . FUDUCIAL PLACEMENT N/A 09/29/2015   Procedure:  PLACEMENT OF FUDUCIAL Marker times three;  Surgeon: [facility], MD;  Location: [facility];  Service: Thoracic;  Laterality: N/A;  . LUNG BIOPSY N/A 09/29/2015   Procedure: LUNG BIOPSY;  Surgeon: [facility], MD;  Location: [place];  Service: Thoracic;  Laterality: N/A;  . ORIF HIP FRACTURE    . TONSILLECTOMY    . VIDEO BRONCHOSCOPY WITH ENDOBRONCHIAL NAVIGATION N/A 09/29/2015   Procedure: VIDEO BRONCHOSCOPY WITH ENDOBRONCHIAL NAVIGATION;  Surgeon: [facility], MD;  Location: [facility];  Service: Thoracic;  Laterality: N/A;     Social History   reports that she has never smoked. She has never used smokeless tobacco. She reports current alcohol use of about 1.0 standard drink of alcohol per week. She reports that she does not use drugs.   Family History   Her family history includes Cancer in her father; Heart disease in her mother. There is no history of Diabetes, Hyperlipidemia, or Hypertension.   Allergies Allergies  Allergen Reactions  . Peanut-Containing Drug Products Other (See Comments)    angioedema  . Peanut-Containing Drug Products Rash     Home Medications  Prior to Admission  medications   Medication Sig Start Date End Date Taking? Authorizing Provider  amiodarone (PACERONE) 200 MG tablet Take 1 tablet (200 mg total) by mouth every 12 (twelve) hours. 1 tab bid till 09/13, then 1 tab daily. 03/06/20   Barrett, Evelene Croon, PA-C  ascorbic acid (VITAMIN C) 500 MG tablet Take 1,000 mg by mouth 2 (two) times daily.    [provider]  atorvastatin (LIPITOR) 20 MG tablet Take 1 tablet (20 mg total) by mouth daily at 6 PM. 08/18/19   Turner, Eber Hong, MD  Bromfenac Sodium (PROLENSA) 0.07 % SOLN Place 1 drop into the right eye at bedtime.     [provider]  cyanocobalamin 2000 MCG tablet Take 2,000 mcg by mouth daily.    [provider]  diltiazem (CARDIZEM) 30 MG tablet Take 1 tablet (30 mg total) by mouth 2 (two) times daily as needed (RATE CONTROL). 03/17/20   Furth, Cadence H, PA-C  moxifloxacin (VIGAMOX) 0.5 % ophthalmic solution Place 1 drop into the right eye 4 (four) times daily.  02/08/20   [provider]  Multiple Vitamin (MULTIVITAMIN) tablet Take 1 tablet by mouth daily.    [provider]  nitroGLYCERIN (NITROSTAT) 0.4 MG SL tablet Place 0.4 mg under the tongue every 5 (five) minutes as needed for chest pain.    [provider]  prednisoLONE acetate (PRED FORTE) 1 % ophthalmic suspension Place 1 drop into the right eye 4 (four) times daily.  02/08/20   [provider]  warfarin (COUMADIN) 5 MG tablet TAKE 1 AND 1/2 TABLET DAILY EXCEPT 2 TABLETS ON TUESDAY AND THURSDAY OR AS DIRECTED BY COUMADIN CLINIC 01/06/20   Sueanne Margarita, MD     Critical care time: 33 minutes  Lars Masson, MD

## 2020-04-08 NOTE — Anesthesia Procedure Notes (Signed)
Arterial Line Insertion Start/End10/20/2021 5:50 AM, 04/16/2020 5:53 AM Performed by: Wilburn Cornelia, CRNA, CRNA  Patient location: OR. Preanesthetic checklist: patient identified, IV checked, site marked, risks and benefits discussed, surgical consent, monitors and equipment checked, pre-op evaluation, timeout performed and anesthesia consent Right, radial was placed Catheter size: 20 G Seldinger technique used Allen's test indicative of satisfactory collateral circulation Attempts: 1 Procedure performed without using ultrasound guided technique. Following insertion, Biopatch and dressing applied. Post procedure assessment: normal

## 2020-04-08 NOTE — ED Notes (Signed)
Respiratory at bedside for an abg

## 2020-04-08 NOTE — Progress Notes (Signed)
Bagged patient to OR with RN without any complications.

## 2020-04-08 NOTE — Anesthesia Preprocedure Evaluation (Addendum)
Anesthesia Evaluation  Patient identified by MRN, date of birth, ID band Patient unresponsive    Reviewed: Patient's Chart, lab work & pertinent test resultsPreop documentation limited or incomplete due to emergent nature of procedure.  Airway Mallampati: Intubated       Dental   Pulmonary    breath sounds clear to auscultation       Cardiovascular hypertension,  Rhythm:Regular Rate:Tachycardia     Neuro/Psych    GI/Hepatic   Endo/Other    Renal/GU      Musculoskeletal   Abdominal   Peds  Hematology   Anesthesia Other Findings   Reproductive/Obstetrics                             Anesthesia Physical Anesthesia Plan  ASA: IV and emergent  Anesthesia Plan: General   Post-op Pain Management:    Induction: Intravenous  PONV Risk Score and Plan:   Airway Management Planned: Oral ETT  Additional Equipment: Arterial line, CVP and Ultrasound Guidance Line Placement  Intra-op Plan:   Post-operative Plan: Post-operative intubation/ventilation  Informed Consent: I have reviewed the patients History and Physical, chart, labs and discussed the procedure including the risks, benefits and alternatives for the proposed anesthesia with the patient or authorized representative who has indicated his/her understanding and acceptance.       Plan Discussed with: CRNA and Anesthesiologist  Anesthesia Plan Comments:         Anesthesia Quick Evaluation

## 2020-04-08 NOTE — Progress Notes (Signed)
Pt set up on vent in PACU post surgery.  Pt is on previous vent settings.  No issues noted.  Rn at bedside.  RT will continue to monitor.

## 2020-04-08 NOTE — ED Provider Notes (Signed)
[facility] EMERGENCY DEPARTMENT Provider Note   CSN: [id number] Arrival date & time: 04/21/2020  7846     History Chief Complaint  Patient presents with  . Altered Mental Status   Level 5 caveat due to altered mental status/unresponsiveness Cynthia Vargas is a 86 y.o. female.  The history is provided by the EMS personnel. The history is limited by the condition of the patient.  Altered Mental Status Presenting symptoms: unresponsiveness   Severity:  Severe Most recent episode:  Today Episode history:  Single Timing:  Constant Progression:  Worsening Chronicity:  New Patient with history of GERD, hyperlipidemia, paroxysmal atrial fibrillation on coumadin, CAD, presents with unresponsiveness Per EMS, patient was at dinner and reporting sudden onset of worst headache of life and then became unresponsive.  Per EMS patient had possible posturing.  Patient is known to be on Coumadin. No other details known on arrival     Past Medical History:  Diagnosis Date  . Acute pericarditis 11/01/2015   a. moderate pericardial effusion by echo 09/2015 - signficantly improved with trivial effusion on repeat echo after NSAIDS and colchicine 10/2015; b. 07/2019 Small effusion on echo.  [name] CALCANEAL FRACTURE, RIGHT 11/04/2007  . Essential hypertension   . GERD (gastroesophageal reflux disease)   . Hyperlipidemia LDL goal <70 02/08/2016  . KNEE PAIN, RIGHT, CHRONIC 03/19/2010  . Lung nodule    a. found on CT 2017 - possible stage IIB carcinoma.  . Mild Aortic insufficiency   . Mild Mitral regurgitation   . Moderate tricuspid regurgitation   . Nonobstructive CAD (coronary artery disease)    a. 11/2017 MV: EF 71%. No ischemia; b. 12/2017 Cor CTA: 0-25% ostial LM and 40-69% pLAD with normal FFR.  . OSTEOPOROSIS 05/02/2007  . PAF (paroxysmal atrial fibrillation) ([place])    a. Dx 03/2018--converted on oral Dilt.  CHA2DS2VASc = 5-->warfarin.  [name] PEPTIC ULCER DISEASE 05/02/2007  . Syncope      Patient Active Problem List   Diagnosis Date Noted  . Paroxysmal atrial fibrillation with RVR ([place]) 03/05/2020  . Syncope 03/03/2020  . Atrial flutter with rapid ventricular response ([place])   . Long term (current) use of anticoagulants 07/09/2018  . Urine incontinence 04/09/2018  . Atrial fibrillation with RVR ([place]) 03/04/2018  . CAD (coronary artery disease), native coronary artery   . Abnormal WBC count 07/04/2016  . Abnormal TSH 07/04/2016  . Hyperlipidemia LDL goal <70 02/08/2016  . CAD (coronary artery disease) 11/23/2015  . Lung mass 11/23/2015  . Aortic insufficiency 11/23/2015  . Pericardial effusion 11/01/2015  . Mass of lung 10/08/2015  . GERD (gastroesophageal reflux disease) 11/08/2013  . Weakness 08/18/2013  . Routine health maintenance 08/18/2013  . Anemia 03/30/2013  . Allergic rhinitis, cause unspecified 06/30/2011  . KNEE PAIN, RIGHT, CHRONIC 03/19/2010  . Essential hypertension 05/02/2007  . Osteoporosis 05/02/2007    Past Surgical History:  Procedure Laterality Date  . Calcaneal fracture, right    . CHOLECYSTECTOMY    . DILATION AND CURETTAGE OF UTERUS    . ESOPHAGOGASTRODUODENOSCOPY N/A 03/30/2013   Procedure: ESOPHAGOGASTRODUODENOSCOPY (EGD);  Surgeon: Jerene Bears, MD;  Location: [facility];  Service: Endoscopy;  Laterality: N/A;  . FUDUCIAL PLACEMENT N/A 09/29/2015   Procedure:  PLACEMENT OF FUDUCIAL Marker times three;  Surgeon: Grace Isaac, MD;  Location: [facility];  Service: Thoracic;  Laterality: N/A;  . LUNG BIOPSY N/A 09/29/2015   Procedure: LUNG BIOPSY;  Surgeon: Grace Isaac, MD;  Location: [facility];  Service: Thoracic;  Laterality: N/A;  . ORIF HIP FRACTURE    . TONSILLECTOMY    . VIDEO BRONCHOSCOPY WITH ENDOBRONCHIAL NAVIGATION N/A 09/29/2015   Procedure: VIDEO BRONCHOSCOPY WITH ENDOBRONCHIAL NAVIGATION;  Surgeon: Grace Isaac, MD;  Location: [facility];  Service: Thoracic;  Laterality: N/A;     OB History   No obstetric history on  file.     Family History  Problem Relation Age of Onset  . Cancer Father   . Heart disease Mother   . Diabetes Neg Hx   . Hyperlipidemia Neg Hx   . Hypertension Neg Hx     Social History   Tobacco Use  . Smoking status: Never Smoker  . Smokeless tobacco: Never Used  Vaping Use  . Vaping Use: Never used  Substance Use Topics  . Alcohol use: Yes    Alcohol/week: 1.0 standard drink    Types: 1 Glasses of wine per week    Comment: very rarely on special occassionally  . Drug use: No    Home Medications Prior to Admission medications   Medication Sig Start Date End Date Taking? Authorizing Provider  amiodarone (PACERONE) 200 MG tablet Take 1 tablet (200 mg total) by mouth every 12 (twelve) hours. 1 tab bid till 09/13, then 1 tab daily. 03/06/20   Barrett, Evelene Croon, PA-C  ascorbic acid (VITAMIN C) 500 MG tablet Take 1,000 mg by mouth 2 (two) times daily.    [provider]  atorvastatin (LIPITOR) 20 MG tablet Take 1 tablet (20 mg total) by mouth daily at 6 PM. 08/18/19   Turner, Eber Hong, MD  Bromfenac Sodium (PROLENSA) 0.07 % SOLN Place 1 drop into the right eye at bedtime.     [provider]  cyanocobalamin 2000 MCG tablet Take 2,000 mcg by mouth daily.    [provider]  diltiazem (CARDIZEM) 30 MG tablet Take 1 tablet (30 mg total) by mouth 2 (two) times daily as needed (RATE CONTROL). 03/17/20   Furth, Cadence H, PA-C  moxifloxacin (VIGAMOX) 0.5 % ophthalmic solution Place 1 drop into the right eye 4 (four) times daily.  02/08/20   [provider]  Multiple Vitamin (MULTIVITAMIN) tablet Take 1 tablet by mouth daily.    [provider]  nitroGLYCERIN (NITROSTAT) 0.4 MG SL tablet Place 0.4 mg under the tongue every 5 (five) minutes as needed for chest pain.    [provider]  prednisoLONE acetate (PRED FORTE) 1 % ophthalmic suspension Place 1 drop into the right eye 4 (four) times daily.  02/08/20   [provider]    warfarin (COUMADIN) 5 MG tablet TAKE 1 AND 1/2 TABLET DAILY EXCEPT 2 TABLETS ON TUESDAY AND THURSDAY OR AS DIRECTED BY COUMADIN CLINIC 01/06/20   Sueanne Margarita, MD    Allergies    Peanut-containing drug products and Peanut-containing drug products  Review of Systems   Review of Systems  Unable to perform ROS: Patient unresponsive    Physical Exam Updated Vital Signs BP (!) 200/126   Pulse (!) 110   Temp (!) 97.3 F (36.3 C) (Tympanic)   Resp 20   Ht 1.575 m (5\' 2" )   Wt 49 kg   SpO2 100%   BMI 19.76 kg/m   Physical Exam CONSTITUTIONAL: Elderly, unresponsive HEAD: Normocephalic/atraumatic, no signs of trauma EYES: Pupils pinpoint bilaterally ENMT: Mucous membranes moist NECK: supple no meningeal signs CV: S1/S2 noted, no murmurs/rubs/gallops noted LUNGS: Lungs are clear to auscultation bilaterally, no apparent distress ABDOMEN:  soft NEURO: Pt is unresponsive.  She will respond to pain.  She does not respond to voice.  Patient has rigidity to her legs.  Patient is flexing her left arm only GCS equals 8 EXTREMITIES: pulses normal/equal, no deformities SKIN: warm, color normal PSYCH: Unable to assess  ED Results / Procedures / Treatments   Labs (all labs ordered are listed, but only abnormal results are displayed) Labs Reviewed  CBC WITH DIFFERENTIAL/PLATELET - Abnormal; Notable for the following components:      Result Value   Neutro Abs 8.6 (*)    All other components within normal limits  COMPREHENSIVE METABOLIC PANEL - Abnormal; Notable for the following components:   Glucose, Bld 135 (*)    AST 51 (*)    GFR, Estimated 54 (*)    All other components within normal limits  PROTIME-INR - Abnormal; Notable for the following components:   Prothrombin Time 28.8 (*)    INR 2.8 (*)    All other components within normal limits  CBG MONITORING, ED - Abnormal; Notable for the following components:   Glucose-Capillary 126 (*)    All other components within normal  limits  RESPIRATORY PANEL BY RT PCR (FLU A&B, COVID)  URINALYSIS, ROUTINE W REFLEX MICROSCOPIC  AMMONIA  TYPE AND SCREEN    EKG EKG Interpretation  Date/Time:  Saturday April 08 2020 02:16:15 EDT Ventricular Rate:  96 PR Interval:    QRS Duration: 104 QT Interval:  406 QTC Calculation: 514 R Axis:   85 Text Interpretation: Sinus rhythm Borderline right axis deviation Borderline repol abnormality, diffuse leads Prolonged QT interval Interpretation limited secondary to artifact Confirmed by Ripley Fraise ([id number]) on 04/25/2020 2:19:26 AM   Radiology DG Chest Port 1 View  Result Date: 04/22/2020 CLINICAL DATA:  Altered mental status, intubation EXAM: PORTABLE CHEST 1 VIEW COMPARISON:  03/03/2020 FINDINGS: The extreme lung apices are excluded from view. Endotracheal tube is seen 3.1 cm above the carina. Nasogastric tube extends into the stomach within the left upper quadrant of the abdomen. The lungs are well expanded, are symmetric, and are clear. No definite pneumothorax. No pleural effusion. Cardiac size within normal limits. Pulmonary vascularity is normal. Several surgical clips are seen overlying the right mid lung zone. No acute bone abnormality. IMPRESSION: Endotracheal tube in appropriate position. Nasogastric tube in appropriate position. Lungs are clear. Electronically Signed   By: Fidela Salisbury MD   On: 04/10/2020 03:14    Procedures .Critical Care Performed by: Ripley Fraise, MD Authorized by: Ripley Fraise, MD   Critical care provider statement:    Critical care time (minutes):  60   Critical care start time:  04/05/2020 3:00 AM   Critical care end time:  04/12/2020 4:00 AM   Critical care time was exclusive of:  Separately billable procedures and treating other patients   Critical care was necessary to treat or prevent imminent or life-threatening deterioration of the following conditions:  CNS failure or compromise   Critical care was time spent personally by  me on the following activities:  Discussions with consultants, evaluation of patient's response to treatment, examination of patient, re-evaluation of patient's condition, pulse oximetry, ordering and review of radiographic studies, ordering and review of laboratory studies, ordering and performing treatments and interventions and review of old charts   I assumed direction of critical care for this patient from another provider in my specialty: no   Procedure Name: Intubation Date/Time: 04/21/2020 2:50 AM Performed by: Ripley Fraise, MD Pre-anesthesia Checklist: Suction available,  Patient identified and Patient being monitored Oxygen Delivery Method: Nasal cannula Preoxygenation: Pre-oxygenation with 100% oxygen Induction Type: Rapid sequence Ventilation: Mask ventilation without difficulty Laryngoscope Size: Glidescope Grade View: Grade I Tube size: 7.0 mm Number of attempts: 1 Airway Equipment and Method: Video-laryngoscopy Placement Confirmation: ETT inserted through vocal cords under direct vision,  CO2 detector and Breath sounds checked- equal and bilateral Secured at: 23 cm       Medications Ordered in ED Medications  prothrombin complex conc human (KCENTRA) IVPB 1,613 Units (1,613 Units Intravenous New Bag/Given 04/14/2020 0411)  propofol (DIPRIVAN) 1000 MG/100ML infusion (20 mcg/kg/min  49 kg Intravenous New Bag/Given 04/07/2020 0308)  phytonadione (VITAMIN K) 10 mg in dextrose 5 % 50 mL IVPB (0 mg Intravenous Stopped 04/12/2020 0405)    ED Course  I have reviewed the triage vital signs and the nursing notes.  Pertinent labs & imaging results that were available during my care of the patient were reviewed by me and considered in my medical decision making (see chart for details).    MDM Rules/Calculators/A&P                          3:09 AM Patient seen on arrival as she came by EMS.  Patient reported worst headache of life and then had an episode of unresponsiveness.  On  arrival to the ER, GCS approximately 8.  Patient then began vomiting, therefore patient was suctioned and emergently intubated to protect her airway.  Patient was rapidly taken to CT scanner which demonstrates a large subdural hematoma.  Patient is on Coumadin with unknown INR.  I have ordered vitamin K and Kcentra.  Stat consult to neurosurgery has been placed 3:17 AM Discussed with neurosurgery.  They will formulate plan and call back. 4:31 AM D/w dr Zada Finders with [facility] to take patient emergently to operating room No family available to discuss case Pt to be admitted in critical condition Final Clinical Impression(s) / ED Diagnoses Final diagnoses:  Subdural hematoma [facility])    Rx / DC Orders ED Discharge Orders    None       Ripley Fraise, MD 04/04/2020 [phone]

## 2020-04-08 NOTE — Progress Notes (Signed)
Patient taken to CT with RN and back to the room without any complications.

## 2020-04-08 NOTE — Anesthesia Procedure Notes (Addendum)
Central Venous Catheter Insertion Performed by: Roberts Gaudy, MD, anesthesiologist Start/End10/05/2020 5:40 AM, 04/05/2020 5:45 AM Patient location: Pre-op. Preanesthetic checklist: patient identified, IV checked, site marked, risks and benefits discussed, surgical consent, monitors and equipment checked, pre-op evaluation, timeout performed and anesthesia consent Lidocaine 1% used for infiltration and patient sedated Hand hygiene performed  and maximum sterile barriers used  Catheter size: 8 Fr Total catheter length 16. Central line was placed.Double lumen Procedure performed using ultrasound guided technique. Ultrasound Notes:image(s) printed for medical record Attempts: 2 Following insertion, dressing applied and line sutured. Post procedure assessment: blood return through all ports  Patient tolerated the procedure well with no immediate complications.

## 2020-04-08 NOTE — Code Documentation (Signed)
32mcg ectomidate given by this rn

## 2020-04-08 NOTE — Brief Op Note (Signed)
04/05/2020  7:37 AM  PATIENT:  Cynthia Vargas  86 y.o. female  PRE-OPERATIVE DIAGNOSIS:  Subdural hematoma  POST-OPERATIVE DIAGNOSIS:  Subdural hematoma  PROCEDURE:  Procedure(s): LEFT CRANIOTOMY HEMATOMA EVACUATION SUBDURAL (Left)  SURGEON:  Surgeon(s) and Role:    * Judith Part, MD - Primary  PHYSICIAN ASSISTANT:   ASSISTANTS: Osie Cheeks NP   ANESTHESIA:   general  EBL:  200 mL   BLOOD ADMINISTERED:none  DRAINS: Ventricular catheter in the subdural space   LOCAL MEDICATIONS USED:  LIDOCAINE   SPECIMEN:  No Specimen  DISPOSITION OF SPECIMEN:  N/A  COUNTS:  YES  TOURNIQUET:  * No tourniquets in log *  DICTATION: .Note written in EPIC  PLAN OF CARE: Admit to inpatient   PATIENT DISPOSITION:  ICU - intubated and hemodynamically stable.   Delay start of Pharmacological VTE agent (>24hrs) due to surgical blood loss or risk of bleeding: yes

## 2020-04-08 NOTE — Code Documentation (Signed)
Pt taken on monitor to CT

## 2020-04-08 NOTE — Op Note (Signed)
PATIENT: Cynthia Vargas  DAY OF SURGERY: 04/17/2020   PRE-OPERATIVE DIAGNOSIS:  Left subdural hematoma   POST-OPERATIVE DIAGNOSIS:  Same   PROCEDURE:  Left craniotomy for evacuation of acute subdural hematoma   SURGEON:  Surgeon(s) and Role:    Judith Part, MD - Primary    Osie Cheeks, NP  - Assisting   ANESTHESIA: ETGA   BRIEF HISTORY: This is an 86yo woman who presented with headache and progressive obtundation, on warfarin for AF. The patient was found to have a large left subdural hematoma with over 2cm of MLS. She had an ipsilateral blown pupil but otherwise intact brainstem reflexes, I therefore recommended craniotomy for evacuation. No family was available for consent in person or via telephone.    OPERATIVE DETAIL: The patient was taken to the operating room and placed on the OR table in the supine position. A formal time out was performed with two patient identifiers and confirmed the operative site. Anesthesia was induced by the anesthesia team. The operative site was marked, hair was clipped with surgical clippers, the area was then prepped and draped in a sterile fashion. An inverted question mark incision was placed on the left side.   Soft tissues were dissected and a musculocutaneous flap was created. A trauma craniotomy was created on the left with a high speed drill using burr holes and a footplate attachment. The dura was incised with acute blood products under high pressure and extended in all directions along the hemisphere. This was evacuated and hemostasis was obtained. There were two clear sources of bleeding - a cortical arterial branch over the angular gyrus and another over the midpoint of the superior temporal gyrus. A subdural drain was placed, tunneled through a burr hole in the bone flap and then tunneled through the skin and secured. The bone flap was replaced with titanium plates and screws.   All instrument and sponge counts were correct, hemostasis was  again confirmed, and the incision was then closed in layers. The patient was then returned to anesthesia for emergence. No apparent complications at the completion of the procedure.   EBL:  154mL   DRAINS: Ventricular catheter in the subdural space   SPECIMENS: none   Judith Part, MD 04/09/2020 5:40 AM

## 2020-04-08 NOTE — Transfer of Care (Addendum)
Immediate Anesthesia Transfer of Care Note  Patient: Cynthia Vargas  Procedure(s) Performed: LEFT CRANIOTOMY HEMATOMA EVACUATION SUBDURAL (Left Head)  Patient Location: PACU  Anesthesia Type:General  Level of Consciousness: sedated and Patient remains intubated per anesthesia plan  Airway & Oxygen Therapy: Patient remains intubated per anesthesia plan and Patient placed on Ventilator (see vital sign flow sheet for setting)  Post-op Assessment: Report given to RN and Post -op Vital signs reviewed and stable  Post vital signs: Reviewed and stable  Last Vitals:  Vitals Value Taken Time  BP 138/76 04/03/2020 0800  Temp    Pulse 61 04/13/2020 0803  Resp 19 04/19/2020 0803  SpO2 100 % 04/24/2020 0803  Vitals shown include unvalidated device data.  Last Pain:  Vitals:   04/24/2020 0254  TempSrc: Tympanic         Complications: No complications documented.   Bair hugger placed on patient to continue warming since hypothermic in OR

## 2020-04-08 NOTE — ED Notes (Signed)
Vent settings: o2 100, peep 5, rr20, tv 400

## 2020-04-08 NOTE — Anesthesia Postprocedure Evaluation (Signed)
Anesthesia Post Note  Patient: JERIAH SKUFCA  Procedure(s) Performed: LEFT CRANIOTOMY HEMATOMA EVACUATION SUBDURAL (Left Head)     Patient location during evaluation: PACU (Awaiting ICU bed) Anesthesia Type: General Level of consciousness: sedated and patient remains intubated per anesthesia plan Pain management: pain level controlled Vital Signs Assessment: post-procedure vital signs reviewed and stable Respiratory status: patient remains intubated per anesthesia plan Cardiovascular status: stable Postop Assessment: no apparent nausea or vomiting Anesthetic complications: no   No complications documented.  Last Vitals:  Vitals:   04/09/2020 0820 04/17/2020 0823  BP:  110/69  Pulse: (!) 59 (!) 58  Resp: 17 18  Temp:    SpO2: 100% 100%    Last Pain:  Vitals:   04/25/2020 0254  TempSrc: Tympanic                 Audry Pili

## 2020-04-08 NOTE — H&P (Signed)
Neurosurgery H&P  CC: Subdural hematoma  HPI: This is a 86 y.o. woman that presents with severe headache then progressive obtundation, on warfarin s/p [place] + Vit K. Further history limited due to pt's depressed mental status, no family available at bedside or via telephone.    ROS: A 14 point ROS was performed and is negative except as noted in the HPI.   PMHx:  Past Medical History:  Diagnosis Date  . Acute pericarditis 11/01/2015   a. moderate pericardial effusion by echo 09/2015 - signficantly improved with trivial effusion on repeat echo after NSAIDS and colchicine 10/2015; b. 07/2019 Small effusion on echo.  [name] CALCANEAL FRACTURE, RIGHT 11/04/2007  . Essential hypertension   . GERD (gastroesophageal reflux disease)   . Hyperlipidemia LDL goal <70 02/08/2016  . KNEE PAIN, RIGHT, CHRONIC 03/19/2010  . Lung nodule    a. found on CT 2017 - possible stage IIB carcinoma.  . Mild Aortic insufficiency   . Mild Mitral regurgitation   . Moderate tricuspid regurgitation   . Nonobstructive CAD (coronary artery disease)    a. 11/2017 MV: EF 71%. No ischemia; b. 12/2017 Cor CTA: 0-25% ostial LM and 40-69% pLAD with normal FFR.  . OSTEOPOROSIS 05/02/2007  . PAF (paroxysmal atrial fibrillation) ([place])    a. Dx 03/2018--converted on oral Dilt.  CHA2DS2VASc = 5-->warfarin.  [name] PEPTIC ULCER DISEASE 05/02/2007  . Syncope    FamHx:  Family History  Problem Relation Age of Onset  . Cancer Father   . Heart disease Mother   . Diabetes Neg Hx   . Hyperlipidemia Neg Hx   . Hypertension Neg Hx    SocHx:  reports that she has never smoked. She has never used smokeless tobacco. She reports current alcohol use of about 1.0 standard drink of alcohol per week. She reports that she does not use drugs.  Exam: Vital signs in last 24 hours: Temp:  [96.3 F (35.7 C)-97.3 F (36.3 C)] 97.3 F (36.3 C) (10/09 0254) Pulse Rate:  [86-110] 86 (10/09 0405) Resp:  [20] 20 (10/09 0405) BP: (188-200)/(125-126) 188/125  (10/09 0405) SpO2:  [100 %] 100 % (10/09 0405) FiO2 (%):  [60 %-100 %] 60 % (10/09 0405) Weight:  [49 kg] 49 kg (10/09 0300) General: lying in hospital bed, appears acutely ill Head: Normocephalic and atruamatic HEENT: Neck supple Pulmonary: intubated, good chest rise b/l Cardiac: irregularly irregular  Abdomen: S NT ND Extremities: Warm and well perfused x4 Neuro: Intubated, L pupil fixed/dilated, R minimally reactive, +corneals OU, +cough/gag, extending on L, w/d on R   Assessment and Plan: 86 y.o. woman w/ headache and progressive obtundation. [facility] personally reviewed, which shows large left subdural hematoma with herniation. Exam with ipsilateral blown pupil and posturing, other brainstem reflexes intact.  -will take to OR now for craniotomy for evacuation of SDH -4N ICU post-op  Judith Part, MD 04/26/2020 4:30 AM [facility]

## 2020-04-08 NOTE — ED Triage Notes (Signed)
Pt BIB [facility] from home, pt seen by her daughter earlier in the night. Around 1am, pt c/o headache, nausea and vomiting. Pt responsive to painful stimuli. Hypertensive with EMS, BP 190/100.

## 2020-04-08 NOTE — Progress Notes (Signed)
PT taken to CT and transported back to PACU.  No events noted.  PT tolerated well.  RT will continue to monitor.

## 2020-04-09 DIAGNOSIS — R4182 Altered mental status, unspecified: Secondary | ICD-10-CM | POA: Diagnosis not present

## 2020-04-09 LAB — POCT I-STAT 7, (LYTES, BLD GAS, ICA,H+H)
Acid-Base Excess: 2 mmol/L (ref 0.0–2.0)
Bicarbonate: 25.3 mmol/L (ref 20.0–28.0)
Calcium, Ion: 1.15 mmol/L (ref 1.15–1.40)
HCT: 30 % — ABNORMAL LOW (ref 36.0–46.0)
Hemoglobin: 10.2 g/dL — ABNORMAL LOW (ref 12.0–15.0)
O2 Saturation: 100 %
Patient temperature: 98.4
Potassium: 3.2 mmol/L — ABNORMAL LOW (ref 3.5–5.1)
Sodium: 137 mmol/L (ref 135–145)
TCO2: 26 mmol/L (ref 22–32)
pCO2 arterial: 35.1 mmHg (ref 32.0–48.0)
pH, Arterial: 7.466 — ABNORMAL HIGH (ref 7.350–7.450)
pO2, Arterial: 229 mmHg — ABNORMAL HIGH (ref 83.0–108.0)

## 2020-04-09 LAB — BASIC METABOLIC PANEL
Anion gap: 9 (ref 5–15)
BUN: 10 mg/dL (ref 8–23)
CO2: 23 mmol/L (ref 22–32)
Calcium: 8.5 mg/dL — ABNORMAL LOW (ref 8.9–10.3)
Chloride: 104 mmol/L (ref 98–111)
Creatinine, Ser: 0.74 mg/dL (ref 0.44–1.00)
GFR, Estimated: >60 mL/min (ref >60)
Glucose, Bld: 112 mg/dL — ABNORMAL HIGH (ref 70–99)
Potassium: 3.4 mmol/L — ABNORMAL LOW (ref 3.5–5.1)
Sodium: 136 mmol/L (ref 135–145)

## 2020-04-09 LAB — PROTIME-INR
INR: 1 (ref 0.8–1.2)
INR: 1 (ref 0.8–1.2)
Prothrombin Time: 13.1 seconds (ref 11.4–15.2)
Prothrombin Time: 12.5 seconds (ref 11.4–15.2)

## 2020-04-09 LAB — CBC WITH DIFFERENTIAL/PLATELET
Abs Immature Granulocytes: 0.05 10*3/uL (ref 0.00–0.07)
Basophils Absolute: 0 10*3/uL (ref 0.0–0.1)
Basophils Relative: 0 %
Eosinophils Absolute: 0 10*3/uL (ref 0.0–0.5)
Eosinophils Relative: 0 %
HCT: 32 % — ABNORMAL LOW (ref 36.0–46.0)
Hemoglobin: 10.7 g/dL — ABNORMAL LOW (ref 12.0–15.0)
Immature Granulocytes: 1 %
Lymphocytes Relative: 4 %
Lymphs Abs: 0.4 10*3/uL — ABNORMAL LOW (ref 0.7–4.0)
MCH: 31.4 pg (ref 26.0–34.0)
MCHC: 33.4 g/dL (ref 30.0–36.0)
MCV: 93.8 fL (ref 80.0–100.0)
Monocytes Absolute: 1.1 10*3/uL — ABNORMAL HIGH (ref 0.1–1.0)
Monocytes Relative: 11 %
Neutro Abs: 8.6 10*3/uL — ABNORMAL HIGH (ref 1.7–7.7)
Neutrophils Relative %: 84 %
Platelets: 164 10*3/uL (ref 150–400)
RBC: 3.41 MIL/uL — ABNORMAL LOW (ref 3.87–5.11)
RDW: 13.9 % (ref 11.5–15.5)
WBC: 10.2 10*3/uL (ref 4.0–10.5)
nRBC: 0 % (ref 0.0–0.2)

## 2020-04-09 MED ORDER — DOCUSATE SODIUM 50 MG/5ML PO LIQD
100.0000 mg | Freq: Two times a day (BID) | ORAL | Status: DC
  Administered 2020-04-09 – 2020-04-17 (×17): 100 mg
  Filled 2020-04-09 (×17): qty 10

## 2020-04-09 MED ORDER — ONDANSETRON HCL 4 MG PO TABS: 4.0000 mg | ORAL_TABLET | ORAL | Status: DC | PRN

## 2020-04-09 MED ORDER — ACETAMINOPHEN 650 MG RE SUPP: 650.0000 mg | RECTAL | Status: DC | PRN

## 2020-04-09 MED ORDER — POLYETHYLENE GLYCOL 3350 17 G PO PACK: 17.0000 g | PACK | Freq: Every day | ORAL | Status: DC | PRN

## 2020-04-09 MED ORDER — POTASSIUM CHLORIDE 20 MEQ/15ML (10%) PO SOLN
40.0000 meq | Freq: Once | ORAL | Status: AC
  Administered 2020-04-09: 40 meq
  Filled 2020-04-09: qty 30

## 2020-04-09 MED ORDER — ACETAMINOPHEN 325 MG PO TABS
650.0000 mg | ORAL_TABLET | ORAL | Status: DC | PRN
  Filled 2020-04-09: qty 2

## 2020-04-09 MED ORDER — HYDROCODONE-ACETAMINOPHEN 5-325 MG PO TABS: 1.0000 | ORAL_TABLET | ORAL | Status: DC | PRN

## 2020-04-09 MED ORDER — ATORVASTATIN CALCIUM 10 MG PO TABS
20.0000 mg | ORAL_TABLET | Freq: Every day | ORAL | Status: DC
  Administered 2020-04-09 – 2020-04-17 (×7): 20 mg
  Filled 2020-04-09 (×8): qty 2

## 2020-04-09 MED ORDER — AMIODARONE HCL 200 MG PO TABS
200.0000 mg | ORAL_TABLET | Freq: Every day | ORAL | Status: DC
  Administered 2020-04-09 – 2020-04-17 (×9): 200 mg
  Filled 2020-04-09 (×9): qty 1

## 2020-04-09 MED ORDER — ONDANSETRON HCL 4 MG/2ML IJ SOLN: 4.0000 mg | INTRAMUSCULAR | Status: DC | PRN

## 2020-04-09 NOTE — Progress Notes (Signed)
Pharmacy Electrolyte Replacement  Recent Labs:  Recent Labs    04/09/20 0751  K 3.4*  CREATININE 0.74    Low Critical Values (K </= 2.5, Phos </= 1, Mg </= 1) Present: None  Plan:  - K 3.4 - supplement with KCl 40 mEq PT x 1 - Recheck with AM labs  Thank you for allowing pharmacy to be a part of this patient's care.  Alycia Rossetti, PharmD, BCPS Clinical Pharmacist Clinical phone for 04/09/2020: [phone] 04/09/2020 10:55 AM   **Pharmacist phone directory can now be found on [email or web address] ([email or web address]).  Listed under [place].

## 2020-04-09 NOTE — Progress Notes (Signed)
PT Cancellation Note  Patient Details Name: TASHIBA TIMONEY MRN: [id number] DOB: [birth year]   Cancelled Treatment:    Reason Eval/Treat Not Completed: Patient not medically ready;Patient's level of consciousness. Pt remains intubated and on no sedation, RN reports the pt remains unable to follow commands with no notable spontaneous movement noted. PT will hold and follow up tomorrow to see if the pt is more alert and better able to participated in PT evaluation.   Zenaida Niece 04/09/2020, 10:47 AM

## 2020-04-09 NOTE — Progress Notes (Signed)
Neurosurgery Service Progress Note  Subjective: No acute events overnight. Pt didn't required sedation over night  Objective: Vitals:   04/09/20 1100 04/09/20 1117 04/09/20 1118 04/09/20 1200  BP: 119/69 119/69  122/69  Pulse: 74 75  75  Resp: 18 18  18   Temp:    98.7 F (37.1 C)  TempSrc:    Axillary  SpO2: 100% 100% 100% 100%  Weight:      Height:       Temp (24hrs), Avg:98.6 F (37 C), Min:97.8 F (36.6 C), Max:100.2 F (37.9 C)  CBC Latest Ref Rng & Units 04/09/2020 04/09/2020 04/25/2020  WBC 4.0 - 10.5 K/uL 10.2 - -  Hemoglobin 12.0 - 15.0 g/dL 10.7(L) 10.2(L) 10.2(L)  Hematocrit 36 - 46 % 32.0(L) 30.0(L) 30.0(L)  Platelets 150 - 400 K/uL 164 - -   BMP Latest Ref Rng & Units 04/09/2020 04/09/2020 04/13/2020  Glucose 70 - 99 mg/dL 112(H) - -  BUN 8 - 23 mg/dL 10 - -  Creatinine 0.44 - 1.00 mg/dL 0.74 - -  BUN/Creat Ratio 12 - 28 - - -  Sodium 135 - 145 mmol/L 136 137 138  Potassium 3.5 - 5.1 mmol/L 3.4(L) 3.2(L) 3.6  Chloride 98 - 111 mmol/L 104 - -  CO2 22 - 32 mmol/L 23 - -  Calcium 8.9 - 10.3 mg/dL 8.5(L) - -    Intake/Output Summary (Last 24 hours) at 04/09/2020 1239 Last data filed at 04/09/2020 1200 Gross per 24 hour  Intake 696.26 ml  Output 893 ml  Net -196.74 ml    Current Facility-Administered Medications:  .  acetaminophen (TYLENOL) tablet 650 mg, 650 mg, Per Tube, Q4H PRN **OR** acetaminophen (TYLENOL) suppository 650 mg, 650 mg, Rectal, Q4H PRN, Judith Part, MD .  amiodarone (PACERONE) tablet 200 mg, 200 mg, Per Tube, Daily, Judith Part, MD, 200 mg at 04/09/20 1049 .  atorvastatin (LIPITOR) tablet 20 mg, 20 mg, Per Tube, q1800, Judith Part, MD .  chlorhexidine gluconate (MEDLINE KIT) (PERIDEX) 0.12 % solution 15 mL, 15 mL, Mouth Rinse, BID, Ostergard, Joyice Faster, MD, 15 mL at 04/09/20 0751 .  Chlorhexidine Gluconate Cloth 2 % PADS 6 each, 6 each, Topical, Daily, Judith Part, MD, 6 each at 04/25/2020 1559 .  clevidipine  (CLEVIPREX) infusion 0.5 mg/mL, 0-21 mg/hr, Intravenous, Continuous, Ostergard, Joyice Faster, MD, Stopped at 04/09/20 1047 .  diltiazem (CARDIZEM) tablet 30 mg, 30 mg, Oral, BID PRN, Judith Part, MD .  docusate (COLACE) 50 MG/5ML liquid 100 mg, 100 mg, Per Tube, BID, Judith Part, MD, 100 mg at 04/09/20 1049 .  [START ON 04/10/2020] heparin injection 5,000 Units, 5,000 Units, Subcutaneous, Q8H, Ostergard, Thomas A, MD .  HYDROcodone-acetaminophen (NORCO/VICODIN) 5-325 MG per tablet 1 tablet, 1 tablet, Per Tube, Q4H PRN, Judith Part, MD .  HYDROmorphone (DILAUDID) injection 0.5 mg, 0.5 mg, Intravenous, Q3H PRN, Judith Part, MD .  labetalol (NORMODYNE) injection 10-40 mg, 10-40 mg, Intravenous, Q10 min PRN, Judith Part, MD .  MEDLINE mouth rinse, 15 mL, Mouth Rinse, 10 times per day, Judith Part, MD, 15 mL at 04/09/20 1134 .  ondansetron (ZOFRAN) tablet 4 mg, 4 mg, Per Tube, Q4H PRN **OR** ondansetron (ZOFRAN) injection 4 mg, 4 mg, Intravenous, Q4H PRN, Ostergard, Thomas A, MD .  polyethylene glycol (MIRALAX / GLYCOLAX) packet 17 g, 17 g, Per Tube, Daily PRN, Judith Part, MD .  promethazine (PHENERGAN) tablet 12.5-25 mg, 12.5-25 mg, Oral, Q4H PRN, Ostergard,  Joyice Faster, MD .  propofol (DIPRIVAN) 1000 MG/100ML infusion, 5-80 mcg/kg/min, Intravenous, Continuous, Ripley Fraise, MD, Stopped at 04/22/2020 1700   Physical Exam: Intubated, no sedation no response to voice, pupils fixated bilateral, +corneals reflex OU, +cough/gag, extends L and R arms to sternal rub  Assessment & Plan: 86 y.o. female with headache and progressive obtundation. CT head showed large left subdural hematoma with herniation.  04/07/2020: left craniotomy hematoma evacuation subdural (left). Repeated CT scan shows resolution of the midline shift with substantial pneumocephalus.  -vent management by CCM -will continue supportive care, prognosis remains quiet poor -SCDS/  TEDs -Heparin subq starting tomorrow 10/11 -continue drain care   Osie Cheeks, NP 04/09/20 12:39 PM

## 2020-04-09 NOTE — Progress Notes (Signed)
NAME:  Cynthia Vargas, MRN:  [id number], DOB:  1931-09-20, LOS: 1 ADMISSION DATE:  04/02/2020, CONSULTATION DATE:  04/01/2020 REFERRING MD: Zada Finders , CHIEF COMPLAINT: Altered mental status   Brief History   86 year old normally on Coumadin for paroxysmal atrial fibrillation who presented with altered mental status and a very large left subdural hematoma  History of present illness   Patient apparently complained of a headache and then became unresponsive.  She was brought to the department of emergency medicine she was posturing.  She was intubated for decreased mental status and a CT scan of the head obtained which showed a large left subdural hematoma with substantial midline shift.  She was subsequently taken for a craniotomy after reversal of her Coumadin and arrives in the intensive care unit intubated sedated and mechanically ventilated.  We can obtain no history from the patient.  Past Medical History  Chart indicates a history of hyperlipidemia, coronary artery disease, distant history of pericarditis, history of hyperlipidemia, and a history of GERD. Surgical history is remarkable for cholecystectomy ORIF of the right hip, tonsillectomy, and lung mass biopsy.  Significant Hospital Events   This is postoperative day #1.  She has had no significant subjective change overnight, she has not received any sedation overnight.  Consults:    Procedures:  Craniotomy on 10/9  Significant Diagnostic Tests:    Micro Data:    Antimicrobials:     Interim history/subjective:  As above  Objective   Blood pressure 127/66, pulse 75, temperature 99 F (37.2 C), temperature source Oral, resp. rate 16, height 5\' 2"  (1.575 m), weight 49 kg, SpO2 100 %.    Vent Mode: PRVC FiO2 (%):  [40 %-100 %] 40 % Set Rate:  [18 bmp] 18 bmp Vt Set:  [400 mL] 400 mL PEEP:  [5 cmH20] 5 cmH20 Plateau Pressure:  [15 cmH20-16 cmH20] 15 cmH20   Intake/Output Summary (Last 24 hours) at 04/09/2020  2542 Last data filed at 04/09/2020 0600 Gross per 24 hour  Intake 1942.91 ml  Output 1486 ml  Net 456.91 ml   Filed Weights   04/07/2020 0300  Weight: 49 kg    Examination: General: Cachectic and chronically ill-appearing elderly female orally intubated and mechanically ventilated  HENT:  Lungs: Abrasions are unlabored, she is not breathing above the set ventilator rate.  There is symmetric air movement and no wheezes.  Lungs are clear to supine exam.   Cardiovascular: S1 and S2 are currently regular with sinus showing on the monitor.  There is no murmur rub or gallop.  There is no JVD there is no edema.   Abdomen: The abdomen is soft without masses tenderness guarding or rebound she is anicteric.   Extremities: Limbs are warm without edema  Neuro: There is no response to voice or loud noise.  There is partial eye opening to noxious stimuli.  In response to sternal rub she extends the left arm and may be semipurposeful on the right.   GU:   Resolved Hospital Problem list     Assessment & Plan:  This is an 86 year old normally on Coumadin for history of paroxysmal atrial fibrillation who presented with altered mental status and the finding of a very large left subdural hematoma.  She is status post craniotomy for evacuation.  Post procedure CT scan shows resolution of the midline shift with substantial pneumocephalus.  Her Coumadin was reversed with [place] and vitamin K.  INR today is normal.   Neurological exam remains quite poor  this morning.  I will be discussing with neurosurgery and family the appropriate level of care for this patient.  Should aggressive care be desired we will initiate tube feedings today, electrolytes and blood gas are pending to ensure that we have a optimal milieu for CNS function.  Best practice:  Diet: Feeding to be initiated Pain/Anxiety/Delirium protocol (if indicated):  VAP protocol (if indicated): In place DVT prophylaxis:  GI prophylaxis: Pepcid has been  ordered Glucose control: No hypoglycemic agent indicated Mobility: Bedrest Code Status:  Family Communication: I am yet to meet family. Disposition:   Labs   CBC: Recent Labs  Lab 04/10/2020 0226 04/10/2020 0331 04/13/2020 0902 04/28/2020 1642  WBC 10.4  --  7.5  --   NEUTROABS 8.6*  --   --   --   HGB 13.1 13.9 10.2* 10.2*  HCT 40.0 41.0 30.6* 30.0*  MCV 92.2  --  93.0  --   PLT 191  --  153  --     Basic Metabolic Panel: Recent Labs  Lab 04/03/2020 0226 04/20/2020 0331 04/27/2020 1642  NA 136 136 138  K 3.6 3.1* 3.6  CL 99  --   --   CO2 23  --   --   GLUCOSE 135*  --   --   BUN 16  --   --   CREATININE 0.96  --   --   CALCIUM 9.1  --   --    GFR: Estimated Creatinine Clearance: 32.5 mL/min (by C-G formula based on SCr of 0.96 mg/dL). Recent Labs  Lab 04/03/2020 0226 04/02/2020 0902  WBC 10.4 7.5    Liver Function Tests: Recent Labs  Lab 04/07/2020 0226  AST 51*  ALT 37  ALKPHOS 46  BILITOT 0.6  PROT 7.0  ALBUMIN 4.3   No results for input(s): LIPASE, AMYLASE in the last 168 hours. Recent Labs  Lab 04/11/2020 0226  AMMONIA 20    ABG    Component Value Date/Time   PHART 7.442 04/06/2020 1642   PCO2ART 38.3 04/25/2020 1642   PO2ART 451 (H) 04/30/2020 1642   HCO3 26.3 04/15/2020 1642   TCO2 27 04/13/2020 1642   O2SAT 100.0 04/19/2020 1642     Coagulation Profile: Recent Labs  Lab 04/15/2020 0226 04/05/2020 0534 04/27/2020 0902 04/21/2020 1620 04/09/20 0231  INR 2.8* 1.2 1.3* 1.2 1.0    Cardiac Enzymes: No results for input(s): CKTOTAL, CKMB, CKMBINDEX, TROPONINI in the last 168 hours.  HbA1C: Hgb A1c MFr Bld  Date/Time Value Ref Range Status  09/18/2015 10:39 AM 5.7 (H) <5.7 % Final    Comment:                                                                           According to the ADA Clinical Practice Recommendations for 2011, when HbA1c is used as a screening test:     >=6.5%   Diagnostic of Diabetes Mellitus            (if abnormal result is  confirmed)   5.7-6.4%   Increased risk of developing Diabetes Mellitus   References:Diagnosis and Classification of Diabetes Mellitus,Diabetes ELFY,1017,51(WCHEN 1):S62-S69 and Standards of Medical Care in  Diabetes - 2011,Diabetes Care,2011,34 (Suppl 1):S11-S61.       CBG: Recent Labs  Lab 04/01/2020 0223  GLUCAP 126*    Review of Systems:   Not obtainable  Past Medical History  She,  has a past medical history of Acute pericarditis (11/01/2015), CALCANEAL FRACTURE, RIGHT (11/04/2007), Essential hypertension, GERD (gastroesophageal reflux disease), Hyperlipidemia LDL goal <70 (02/08/2016), KNEE PAIN, RIGHT, CHRONIC (03/19/2010), Lung nodule, Mild Aortic insufficiency, Mild Mitral regurgitation, Moderate tricuspid regurgitation, Nonobstructive CAD (coronary artery disease), OSTEOPOROSIS (05/02/2007), PAF (paroxysmal atrial fibrillation) ([place]), PEPTIC ULCER DISEASE (05/02/2007), and Syncope.   Surgical History    Past Surgical History:  Procedure Laterality Date   Calcaneal fracture, right     CHOLECYSTECTOMY     DILATION AND CURETTAGE OF UTERUS     ESOPHAGOGASTRODUODENOSCOPY N/A 03/30/2013   Procedure: ESOPHAGOGASTRODUODENOSCOPY (EGD);  Surgeon: Jerene Bears, MD;  Location: [facility];  Service: Endoscopy;  Laterality: N/A;   FUDUCIAL PLACEMENT N/A 09/29/2015   Procedure:  PLACEMENT OF FUDUCIAL Marker times three;  Surgeon: Grace Isaac, MD;  Location: [facility];  Service: Thoracic;  Laterality: N/A;   LUNG BIOPSY N/A 09/29/2015   Procedure: LUNG BIOPSY;  Surgeon: Grace Isaac, MD;  Location: [facility];  Service: Thoracic;  Laterality: N/A;   ORIF HIP FRACTURE     TONSILLECTOMY     VIDEO BRONCHOSCOPY WITH ENDOBRONCHIAL NAVIGATION N/A 09/29/2015   Procedure: VIDEO BRONCHOSCOPY WITH ENDOBRONCHIAL NAVIGATION;  Surgeon: Grace Isaac, MD;  Location: [facility];  Service: Thoracic;  Laterality: N/A;     Social History   reports that she has never smoked. She has never  used smokeless tobacco. She reports current alcohol use of about 1.0 standard drink of alcohol per week. She reports that she does not use drugs.   Family History   Her family history includes Cancer in her father; Heart disease in her mother. There is no history of Diabetes, Hyperlipidemia, or Hypertension.   Allergies Allergies  Allergen Reactions   Peanut-Containing Drug Products Other (See Comments)    angioedema   Peanut-Containing Drug Products Rash     Home Medications  Prior to Admission medications   Medication Sig Start Date End Date Taking? Authorizing Provider  amiodarone (PACERONE) 200 MG tablet Take 1 tablet (200 mg total) by mouth every 12 (twelve) hours. 1 tab bid till 09/13, then 1 tab daily. 03/06/20   Barrett, Evelene Croon, PA-C  ascorbic acid (VITAMIN C) 500 MG tablet Take 1,000 mg by mouth 2 (two) times daily.    [provider]  atorvastatin (LIPITOR) 20 MG tablet Take 1 tablet (20 mg total) by mouth daily at 6 PM. 08/18/19   Turner, Eber Hong, MD  Bromfenac Sodium (PROLENSA) 0.07 % SOLN Place 1 drop into the right eye at bedtime.     [provider]  cyanocobalamin 2000 MCG tablet Take 2,000 mcg by mouth daily.    [provider]  diltiazem (CARDIZEM) 30 MG tablet Take 1 tablet (30 mg total) by mouth 2 (two) times daily as needed (RATE CONTROL). 03/17/20   Furth, Cadence H, PA-C  moxifloxacin (VIGAMOX) 0.5 % ophthalmic solution Place 1 drop into the right eye 4 (four) times daily.  02/08/20   [provider]  Multiple Vitamin (MULTIVITAMIN) tablet Take 1 tablet by mouth daily.    [provider]  nitroGLYCERIN (NITROSTAT) 0.4 MG SL tablet Place 0.4 mg under the tongue every 5 (five) minutes as needed for chest pain.    [provider]  prednisoLONE acetate (PRED FORTE) 1 % ophthalmic suspension Place 1 drop into the right eye 4 (four) times daily.  02/08/20   [provider]  warfarin (COUMADIN) 5 MG tablet TAKE 1  AND 1/2 TABLET DAILY EXCEPT 2 TABLETS ON TUESDAY AND THURSDAY OR AS DIRECTED BY COUMADIN CLINIC 01/06/20   Sueanne Margarita, MD       Lars Masson, MD

## 2020-04-10 ENCOUNTER — Encounter (HOSPITAL_COMMUNITY): Payer: Self-pay | Admitting: Neurological Surgery

## 2020-04-10 DIAGNOSIS — R0689 Other abnormalities of breathing: Secondary | ICD-10-CM

## 2020-04-10 DIAGNOSIS — R4182 Altered mental status, unspecified: Secondary | ICD-10-CM | POA: Diagnosis not present

## 2020-04-10 DIAGNOSIS — Z9911 Dependence on respirator [ventilator] status: Secondary | ICD-10-CM

## 2020-04-10 LAB — BASIC METABOLIC PANEL
Anion gap: 10 (ref 5–15)
BUN: 17 mg/dL (ref 8–23)
CO2: 20 mmol/L — ABNORMAL LOW (ref 22–32)
Calcium: 8.4 mg/dL — ABNORMAL LOW (ref 8.9–10.3)
Chloride: 104 mmol/L (ref 98–111)
Creatinine, Ser: 0.94 mg/dL (ref 0.44–1.00)
GFR, Estimated: 55 mL/min — ABNORMAL LOW (ref >60)
Glucose, Bld: 111 mg/dL — ABNORMAL HIGH (ref 70–99)
Potassium: 3.8 mmol/L (ref 3.5–5.1)
Sodium: 134 mmol/L — ABNORMAL LOW (ref 135–145)

## 2020-04-10 LAB — PROTIME-INR
INR: 1.1 (ref 0.8–1.2)
Prothrombin Time: 14.2 seconds (ref 11.4–15.2)

## 2020-04-10 LAB — TRIGLYCERIDES: Triglycerides: 27 mg/dL (ref <150)

## 2020-04-10 MED ORDER — VITAL AF 1.2 CAL PO LIQD
1000.0000 mL | ORAL | Status: DC
  Administered 2020-04-10 – 2020-04-17 (×6): 1000 mL
  Filled 2020-04-10 (×4): qty 1000

## 2020-04-10 NOTE — Progress Notes (Signed)
OT Cancellation Note  Patient Details Name: Cynthia Vargas MRN: [id number] DOB: [birth year]   Cancelled Treatment:    Reason Eval/Treat Not Completed: Medical issues which prohibited therapy; per RN pt still with poor arousal and no command follow (sedation lifted). Will follow up for OT eval as pt better able to participate.   Lou Cal, OT Acute Rehabilitation Services Pager [phone] Office [phone]   [name] 04/10/2020, 2:00 PM

## 2020-04-10 NOTE — Progress Notes (Signed)
Guadelupe Sabin (patient's son) lives in [place] and can be reached at:  Home: [phone]  Mobile: [phone]

## 2020-04-10 NOTE — Progress Notes (Signed)
Neurosurgery Service Progress Note  Subjective: No acute events overnight.   Objective: Vitals:   04/10/20 0800 04/10/20 0824 04/10/20 0900 04/10/20 1000  BP: 135/65  133/62 136/64  Pulse: 74 80 74 74  Resp: _0 Temp: 98.4 F (36.9 C)     TempSrc: Axillary     SpO2: 100% 100% 100% 100%  Weight:      Height:       Temp (24hrs), Avg:98.8 F (37.1 C), Min:98.4 F (36.9 C), Max:99.5 F (37.5 C)  CBC Latest Ref Rng & Units 04/09/2020 04/09/2020 04/19/2020  WBC 4.0 - 10.5 K/uL 10.2 - -  Hemoglobin 12.0 - 15.0 g/dL 10.7(L) 10.2(L) 10.2(L)  Hematocrit 36 - 46 % 32.0(L) 30.0(L) 30.0(L)  Platelets 150 - 400 K/uL 164 - -   BMP Latest Ref Rng & Units 04/10/2020 04/09/2020 04/09/2020  Glucose 70 - 99 mg/dL 111(H) 112(H) -  BUN 8 - 23 mg/dL 17 10 -  Creatinine 0.44 - 1.00 mg/dL 0.94 0.74 -  BUN/Creat Ratio 12 - 28 - - -  Sodium 135 - 145 mmol/L 134(L) 136 137  Potassium 3.5 - 5.1 mmol/L 3.8 3.4(L) 3.2(L)  Chloride 98 - 111 mmol/L 104 104 -  CO2 22 - 32 mmol/L 20(L) 23 -  Calcium 8.9 - 10.3 mg/dL 8.4(L) 8.5(L) -    Intake/Output Summary (Last 24 hours) at 04/10/2020 1049 Last data filed at 04/10/2020 1000 Gross per 24 hour  Intake 42.32 ml  Output 576 ml  Net -533.68 ml    Current Facility-Administered Medications:  .  acetaminophen (TYLENOL) tablet 650 mg, 650 mg, Per Tube, Q4H PRN **OR** acetaminophen (TYLENOL) suppository 650 mg, 650 mg, Rectal, Q4H PRN, Judith Part, MD .  amiodarone (PACERONE) tablet 200 mg, 200 mg, Per Tube, Daily, Judith Part, MD, 200 mg at 04/10/20 1014 .  atorvastatin (LIPITOR) tablet 20 mg, 20 mg, Per Tube, q1800, Judith Part, MD, 20 mg at 04/09/20 1736 .  chlorhexidine gluconate (MEDLINE KIT) (PERIDEX) 0.12 % solution 15 mL, 15 mL, Mouth Rinse, BID, Izaya Netherton, Joyice Faster, MD, 15 mL at 04/10/20 0813 .  Chlorhexidine Gluconate Cloth 2 % PADS 6 each, 6 each, Topical, Daily, Judith Part, MD, 6 each at 04/10/20 1000 .   clevidipine (CLEVIPREX) infusion 0.5 mg/mL, 0-21 mg/hr, Intravenous, Continuous, Jadin Kagel, Joyice Faster, MD, Stopped at 04/09/20 1047 .  diltiazem (CARDIZEM) tablet 30 mg, 30 mg, Oral, BID PRN, Judith Part, MD .  docusate (COLACE) 50 MG/5ML liquid 100 mg, 100 mg, Per Tube, BID, Nanie Dunkleberger A, MD, 100 mg at 04/10/20 1014 .  heparin injection 5,000 Units, 5,000 Units, Subcutaneous, Q8H, Algenis Ballin, Joyice Faster, MD, 5,000 Units at 04/10/20 0630 .  HYDROcodone-acetaminophen (NORCO/VICODIN) 5-325 MG per tablet 1 tablet, 1 tablet, Per Tube, Q4H PRN, Judith Part, MD .  HYDROmorphone (DILAUDID) injection 0.5 mg, 0.5 mg, Intravenous, Q3H PRN, Judith Part, MD .  labetalol (NORMODYNE) injection 10-40 mg, 10-40 mg, Intravenous, Q10 min PRN, Judith Part, MD, 20 mg at 04/09/20 2238 .  MEDLINE mouth rinse, 15 mL, Mouth Rinse, 10 times per day, Judith Part, MD, 15 mL at 04/10/20 1016 .  ondansetron (ZOFRAN) tablet 4 mg, 4 mg, Per Tube, Q4H PRN **OR** ondansetron (ZOFRAN) injection 4 mg, 4 mg, Intravenous, Q4H PRN, Mistina Coatney A, MD .  polyethylene glycol (MIRALAX / GLYCOLAX) packet 17 g, 17 g, Per Tube, Daily PRN, Judith Part, MD .  promethazine (PHENERGAN) tablet 12.5-25  mg, 12.5-25 mg, Oral, Q4H PRN, Judith Part, MD .  propofol (DIPRIVAN) 1000 MG/100ML infusion, 5-80 mcg/kg/min, Intravenous, Continuous, Ripley Fraise, MD, Stopped at 04/15/2020 1700   Physical Exam: Intubated, no sedation, PERRL, gaze neutral, +c/c/g, eyes closed to voice, on stimulation she raises eyebrows but cannot open eyes somewhat consistent with eyelid apraxia, when eyes are forcefully opened she resists Flexes to stim in RUE appropriately but cannot reach midline, more stereotyped in LUE flexion movement W/d BLE  Assessment & Plan: 86 y.o. woman w/ large SDH and herniation with blown pupil on arrival s/p L craniotomy for evacuation.   -will discuss with family, her exam is  a little bit in an intermediate zone with regards to certainty of outcome, no brainstem dysfunction that would assure a poor functional outcome, but lack of eye opening is certainly concerning along with a lack of functional movements or fighting the ETT given she is not on sedation. Given her age, likely to have a poor outcome, but not assuredly going to have a poor outcome. -working to see who medical decision maker is, currently unclear. I would recommend to them that we give her one more day to recover, given that her exam improved post-op but is still not very good without any sedation. If no improvement, will discuss with them regarding goals of care / comfort care.  Joyice Faster Aikam Hellickson  04/10/20 10:49 AM

## 2020-04-10 NOTE — Progress Notes (Addendum)
Initial Nutrition Assessment  DOCUMENTATION CODES:   Non-severe (moderate) malnutrition in context of chronic illness  INTERVENTION:   ADDENDUM ([id number]): RD consulted for tube feeding initiation and management. Will order recommended tube feeding regimen below.  If continuing aggressive care, recommend initiation of enteral nutrition: - Vital AF 1.2 @ 40 ml/hr (960 ml/day) via OG tube  Recommended tube feeding regimen provides 1152 kcal, 72 grams of protein, and 779 ml of H2O.   NUTRITION DIAGNOSIS:   Moderate Malnutrition related to chronic illness as evidenced by moderate fat depletion, severe muscle depletion.  GOAL:   Patient will meet greater than or equal to 90% of their needs  MONITOR:   Vent status, Labs, Weight trends, Skin, I & O's  REASON FOR ASSESSMENT:   Ventilator    ASSESSMENT:   86 year old female who presented to the ED on 10/09 with AMS. PMH of HLD, CAD, GERD, atrial fibrillation. Pt intubated in the ED and taken emergently to the OR for left craniotomy for evacuation of SDH.   Per notes, prognosis is poor. OG tube in stomach per abdominal x-ray, currently to low intermittent suction. Discussed pt with RN. RD will leave TF recommendations.  Unable to obtain diet and weight history at this time. Reviewed available weights in chart. Pt's weight has fluctuated between 48-50 kg over the last 2 years with no real trend in either direction. Current weight is 49 kg. Pt does meet criteria for malnutrition based on NFPE.  Patient is currently intubated on ventilator support MV: 6.6 L/min Temp (24hrs), Avg:98.8 F (37.1 C), Min:98.4 F (36.9 C), Max:99.5 F (37.5 C) BP (cuff): 145/77 MAP (cuff): 96  Medications reviewed and include: colace  Labs reviewed: sodium 134  UOP: 555 ml x 24 hours Ventriculostomy: 10 ml x 24 hours I/O's: -169 ml since admit  NUTRITION - FOCUSED PHYSICAL EXAM:    Most Recent Value  Orbital Region Moderate depletion  Upper Arm  Region Moderate depletion  Thoracic and Lumbar Region Severe depletion  Buccal Region Unable to assess  Temple Region Mild depletion  Clavicle Bone Region Severe depletion  Clavicle and Acromion Bone Region Severe depletion  Scapular Bone Region Unable to assess  Dorsal Hand Severe depletion  Patellar Region Moderate depletion  Anterior Thigh Region Severe depletion  Posterior Calf Region Moderate depletion  Edema (RD Assessment) None  Hair Reviewed  Eyes Unable to assess  Mouth Unable to assess  Skin Reviewed  Nails Reviewed       Diet Order:   Diet Order    None      EDUCATION NEEDS:   No education needs have been identified at this time  Skin:  Skin Assessment: Skin Integrity Issues: Incisions: left head  Last BM:  no documented BM  Height:   Ht Readings from Last 1 Encounters:  04/13/2020 5\' 2"  (1.575 m)    Weight:   Wt Readings from Last 1 Encounters:  04/24/2020 49 kg    Ideal Body Weight:  50 kg  BMI:  Body mass index is 19.76 kg/m.  Estimated Nutritional Needs:   Kcal:  1109  Protein:  65-75 grams  Fluid:  1.3 L/day    Gaynell Face, MS, RD, LDN Inpatient Clinical Dietitian Please see [facility] for contact information.

## 2020-04-10 NOTE — Progress Notes (Signed)
PT Cancellation Note  Patient Details Name: Cynthia Vargas MRN: [id number] DOB: 01-14-1932   Cancelled Treatment:    Reason Eval/Treat Not Completed: Patient not medically ready;Patient's level of consciousness; per RN pt still not following commands with sedation lifted.  Will follow up another day to check on ability to participate.    Reginia Naas 04/10/2020, 3:46 PM  Magda Kiel, PT Acute Rehabilitation Services Pager:[phone] [phone] 04/10/2020

## 2020-04-10 NOTE — Progress Notes (Signed)
NAME:  Cynthia Vargas, MRN:  [id number], DOB:  Jun 30, 1932, LOS: 2 ADMISSION DATE:  04/13/2020, CONSULTATION DATE:  04/26/2020 REFERRING MD: Zada Finders , CHIEF COMPLAINT: Altered mental status   Brief History   86 year old normally on Coumadin for paroxysmal atrial fibrillation who presented with altered mental status and a very large left subdural hematoma  History of present illness   Patient apparently complained of a headache and then became unresponsive.  She was brought to the department of emergency medicine she was posturing.  She was intubated for decreased mental status and a CT scan of the head obtained which showed a large left subdural hematoma with substantial midline shift.  She was subsequently taken for a craniotomy after reversal of her Coumadin and arrives in the intensive care unit intubated sedated and mechanically ventilated.  We can obtain no history from the patient.  Past Medical History  Chart indicates a history of hyperlipidemia, coronary artery disease, distant history of pericarditis, history of hyperlipidemia, and a history of GERD. Surgical history is remarkable for cholecystectomy ORIF of the right hip, tonsillectomy, and lung mass biopsy.  Significant Hospital Events   This is postoperative day #1.  She has had no significant subjective change overnight, she has not received any sedation overnight.  Consults:    Procedures:  Craniotomy on 10/9  Significant Diagnostic Tests:    Micro Data:    Antimicrobials:   ancef 10/8-  Interim history/subjective:  As above  Objective   Blood pressure (!) 145/77, pulse 72, temperature 98.5 F (36.9 C), temperature source Axillary, resp. rate 17, height 5\' 2"  (1.575 m), weight 49 kg, SpO2 100 %.    Vent Mode: PSV;CPAP FiO2 (%):  [40 %] 40 % Set Rate:  [18 bmp] 18 bmp Vt Set:  [400 mL] 400 mL PEEP:  [5 cmH20] 5 cmH20 Pressure Support:  [10 cmH20] 10 cmH20 Plateau Pressure:  [15 cmH20-16 cmH20] 16 cmH20     Intake/Output Summary (Last 24 hours) at 04/10/2020 1551 Last data filed at 04/10/2020 1400 Gross per 24 hour  Intake 40 ml  Output 660 ml  Net -620 ml   Filed Weights   04/18/2020 0300  Weight: 49 kg   General:  Critically ill F, intubated, off sedation HEENT: MM pink/moist, ETT in place Neuro: withdraws and grimaces to pain, resists eye opening, PERRLA, +gag CV: s1s2 rrr, no m/r/g PULM:  CTAB, on full vent support, triggering breaths GI: soft, bsx4 active  Extremities: warm/dry, no edema  Skin: no rashes or lesions   Resolved Hospital Problem list     Assessment & Plan:  This is an 86 year old normally on Coumadin for history of paroxysmal atrial fibrillation who presented with altered mental status and the finding of a very large left subdural hematoma.  She is status post craniotomy for evacuation.  Post procedure CT scan shows resolution of the midline shift with substantial pneumocephalus.  Her Coumadin was reversed with [place] and vitamin K.    Large subdural herniation s/p craniotomy Intubated and post-surgical mental status precluded extubation. Neuro exam is poor, per NS very guarded prognosis as is not requiring any sedation, working to find medical decision-maker -Maintain full vent support with SAT/SBT as tolerated, on minimal settings  -titrate Vent setting to maintain SpO2 greater than or equal to 90%. -HOB elevated 30 degrees. -Plateau pressures less than 30 cm H20.  -Follow chest x-ray, ABG prn.   -Bronchial hygiene and RT/bronchodilator protocol.   Best practice:  Diet: start TF while  clarifying GOC Pain/Anxiety/Delirium protocol (if indicated):  VAP protocol (if indicated): In place DVT prophylaxis:  GI prophylaxis: Pepcid has been ordered Glucose control: No hypoglycemic agent indicated Mobility: Bedrest Code Status: full code Family Communication: per primary Disposition: ICU  Labs   CBC: Recent Labs  Lab 04/05/2020 0226 04/16/2020 0226  04/21/2020 0331 04/10/2020 0902 04/07/2020 1642 04/09/20 0749 04/09/20 0751  WBC 10.4  --   --  7.5  --   --  10.2  NEUTROABS 8.6*  --   --   --   --   --  8.6*  HGB 13.1   < > 13.9 10.2* 10.2* 10.2* 10.7*  HCT 40.0   < > 41.0 30.6* 30.0* 30.0* 32.0*  MCV 92.2  --   --  93.0  --   --  93.8  PLT 191  --   --  153  --   --  164   < > = values in this interval not displayed.    Basic Metabolic Panel: Recent Labs  Lab 04/18/2020 0226 04/21/2020 0226 04/19/2020 0331 04/07/2020 1642 04/09/20 0749 04/09/20 0751 04/10/20 0555  NA 136   < > 136 138 137 136 134*  K 3.6   < > 3.1* 3.6 3.2* 3.4* 3.8  CL 99  --   --   --   --  104 104  CO2 23  --   --   --   --  23 20*  GLUCOSE 135*  --   --   --   --  112* 111*  BUN 16  --   --   --   --  10 17  CREATININE 0.96  --   --   --   --  0.74 0.94  CALCIUM 9.1  --   --   --   --  8.5* 8.4*   < > = values in this interval not displayed.   GFR: Estimated Creatinine Clearance: 33.2 mL/min (by C-G formula based on SCr of 0.94 mg/dL). Recent Labs  Lab 04/22/2020 0226 04/09/2020 0902 04/09/20 0751  WBC 10.4 7.5 10.2    Liver Function Tests: Recent Labs  Lab 04/11/2020 0226  AST 51*  ALT 37  ALKPHOS 46  BILITOT 0.6  PROT 7.0  ALBUMIN 4.3   No results for input(s): LIPASE, AMYLASE in the last 168 hours. Recent Labs  Lab 04/21/2020 0226  AMMONIA 20    ABG    Component Value Date/Time   PHART 7.466 (H) 04/09/2020 0749   PCO2ART 35.1 04/09/2020 0749   PO2ART 229 (H) 04/09/2020 0749   HCO3 25.3 04/09/2020 0749   TCO2 26 04/09/2020 0749   O2SAT 100.0 04/09/2020 0749     Coagulation Profile: Recent Labs  Lab 04/19/2020 0902 04/16/2020 1620 04/09/20 0231 04/09/20 0547 04/10/20 0555  INR 1.3* 1.2 1.0 1.0 1.1    Cardiac Enzymes: No results for input(s): CKTOTAL, CKMB, CKMBINDEX, TROPONINI in the last 168 hours.  HbA1C: Hgb A1c MFr Bld  Date/Time Value Ref Range Status  09/18/2015 10:39 AM 5.7 (H) <5.7 % Final    Comment:  According to the ADA Clinical Practice Recommendations for 2011, when HbA1c is used as a screening test:     >=6.5%   Diagnostic of Diabetes Mellitus            (if abnormal result is confirmed)   5.7-6.4%   Increased risk of developing Diabetes Mellitus   References:Diagnosis and Classification of Diabetes Mellitus,Diabetes EXHB,7169,67(ELFYB 1):S62-S69 and Standards of Medical Care in         Diabetes - 2011,Diabetes OFBP,1025,85 (Suppl 1):S11-S61.       CBG: Recent Labs  Lab 04/25/2020 0223  GLUCAP 126*    Review of Systems:   Not obtainable  Past Medical History  She,  has a past medical history of Acute pericarditis (11/01/2015), CALCANEAL FRACTURE, RIGHT (11/04/2007), Essential hypertension, GERD (gastroesophageal reflux disease), Hyperlipidemia LDL goal <70 (02/08/2016), KNEE PAIN, RIGHT, CHRONIC (03/19/2010), Lung nodule, Mild Aortic insufficiency, Mild Mitral regurgitation, Moderate tricuspid regurgitation, Nonobstructive CAD (coronary artery disease), OSTEOPOROSIS (05/02/2007), PAF (paroxysmal atrial fibrillation) ([place]), PEPTIC ULCER DISEASE (05/02/2007), and Syncope.   Surgical History    Past Surgical History:  Procedure Laterality Date  . Calcaneal fracture, right    . CHOLECYSTECTOMY    . CRANIOTOMY Left 04/11/2020   Procedure: LEFT CRANIOTOMY HEMATOMA EVACUATION SUBDURAL;  Surgeon: Judith Part, MD;  Location: [facility];  Service: Neurosurgery;  Laterality: Left;  . DILATION AND CURETTAGE OF UTERUS    . ESOPHAGOGASTRODUODENOSCOPY N/A 03/30/2013   Procedure: ESOPHAGOGASTRODUODENOSCOPY (EGD);  Surgeon: Jerene Bears, MD;  Location: [facility];  Service: Endoscopy;  Laterality: N/A;  . FUDUCIAL PLACEMENT N/A 09/29/2015   Procedure:  PLACEMENT OF FUDUCIAL Marker times three;  Surgeon: Grace Isaac, MD;  Location: [place];  Service: Thoracic;  Laterality: N/A;  . LUNG BIOPSY N/A 09/29/2015   Procedure: LUNG  BIOPSY;  Surgeon: Grace Isaac, MD;  Location: [facility];  Service: Thoracic;  Laterality: N/A;  . ORIF HIP FRACTURE    . TONSILLECTOMY    . VIDEO BRONCHOSCOPY WITH ENDOBRONCHIAL NAVIGATION N/A 09/29/2015   Procedure: VIDEO BRONCHOSCOPY WITH ENDOBRONCHIAL NAVIGATION;  Surgeon: Grace Isaac, MD;  Location: [place];  Service: Thoracic;  Laterality: N/A;     Social History   reports that she has never smoked. She has never used smokeless tobacco. She reports current alcohol use of about 1.0 standard drink of alcohol per week. She reports that she does not use drugs.   Family History   Her family history includes Cancer in her father; Heart disease in her mother. There is no history of Diabetes, Hyperlipidemia, or Hypertension.   Allergies Allergies  Allergen Reactions  . Peanut-Containing Drug Products Other (See Comments)    angioedema  . Peanut-Containing Drug Products Rash     Home Medications  Prior to Admission medications   Medication Sig Start Date End Date Taking? Authorizing Provider  amiodarone (PACERONE) 200 MG tablet Take 1 tablet (200 mg total) by mouth every 12 (twelve) hours. 1 tab bid till 09/13, then 1 tab daily. 03/06/20   Barrett, Evelene Croon, PA-C  ascorbic acid (VITAMIN C) 500 MG tablet Take 1,000 mg by mouth 2 (two) times daily.    [provider]  atorvastatin (LIPITOR) 20 MG tablet Take 1 tablet (20 mg total) by mouth daily at 6 PM. 08/18/19   Turner, Eber Hong, MD  Bromfenac Sodium (PROLENSA) 0.07 % SOLN Place 1 drop into the right eye at bedtime.     [provider]  cyanocobalamin 2000 MCG tablet Take 2,000  mcg by mouth daily.    [provider]  diltiazem (CARDIZEM) 30 MG tablet Take 1 tablet (30 mg total) by mouth 2 (two) times daily as needed (RATE CONTROL). 03/17/20   Furth, Cadence H, PA-C  moxifloxacin (VIGAMOX) 0.5 % ophthalmic solution Place 1 drop into the right eye 4 (four) times daily.  02/08/20   [provider]  Multiple  Vitamin (MULTIVITAMIN) tablet Take 1 tablet by mouth daily.    [provider]  nitroGLYCERIN (NITROSTAT) 0.4 MG SL tablet Place 0.4 mg under the tongue every 5 (five) minutes as needed for chest pain.    [provider]  prednisoLONE acetate (PRED FORTE) 1 % ophthalmic suspension Place 1 drop into the right eye 4 (four) times daily.  02/08/20   [provider]  warfarin (COUMADIN) 5 MG tablet TAKE 1 AND 1/2 TABLET DAILY EXCEPT 2 TABLETS ON TUESDAY AND THURSDAY OR AS DIRECTED BY COUMADIN CLINIC 01/06/20   Sueanne Margarita, MD     CRITICAL CARE Performed by: Otilio Carpen Giavonna Pflum   Total critical care time: 35 minutes  Critical care time was exclusive of separately billable procedures and treating other patients.  Critical care was necessary to treat or prevent imminent or life-threatening deterioration.  Critical care was time spent personally by me on the following activities: development of treatment plan with patient and/or surrogate as well as nursing, discussions with consultants, evaluation of patient's response to treatment, examination of patient, obtaining history from patient or surrogate, ordering and performing treatments and interventions, ordering and review of laboratory studies, ordering and review of radiographic studies, pulse oximetry and re-evaluation of patient's condition.  Otilio Carpen Teniqua Marron, PA-C [facility]  Pager# [phone], if no answer [phone]

## 2020-04-11 DIAGNOSIS — Z9911 Dependence on respirator [ventilator] status: Secondary | ICD-10-CM | POA: Diagnosis not present

## 2020-04-11 DIAGNOSIS — E440 Moderate protein-calorie malnutrition: Secondary | ICD-10-CM | POA: Insufficient documentation

## 2020-04-11 LAB — PROTIME-INR
INR: 1 (ref 0.8–1.2)
Prothrombin Time: 12.6 seconds (ref 11.4–15.2)

## 2020-04-11 NOTE — Progress Notes (Signed)
NAME:  Cynthia Vargas, MRN:  [id number], DOB:  [birth year], LOS: 3 ADMISSION DATE:  04/03/2020, CONSULTATION DATE:  04/29/2020 REFERRING MD: Zada Finders , CHIEF COMPLAINT: Altered mental status   Brief History   86 year old normally on Coumadin for paroxysmal atrial fibrillation who presented with altered mental status and a very large left subdural hematoma  History of present illness   Patient apparently complained of a headache and then became unresponsive.  She was brought to the department of emergency medicine she was posturing.  She was intubated for decreased mental status and a CT scan of the head obtained which showed a large left subdural hematoma with substantial midline shift.  She was subsequently taken for a craniotomy after reversal of her Coumadin and arrives in the intensive care unit intubated sedated and mechanically ventilated.  We can obtain no history from the patient.  Past Medical History  Chart indicates a history of hyperlipidemia, coronary artery disease, distant history of pericarditis, history of hyperlipidemia, and a history of GERD. Surgical history is remarkable for cholecystectomy ORIF of the right hip, tonsillectomy, and lung mass biopsy.  Significant Hospital Events   This is postoperative day #1.  She has had no significant subjective change overnight, she has not received any sedation overnight.  Consults:    Procedures:  Craniotomy on 10/9  Significant Diagnostic Tests:    Micro Data:    Antimicrobials:   ancef 10/8-  Interim history/subjective:  As above  Objective   Blood pressure 120/74, pulse 77, temperature 97.7 F (36.5 C), temperature source Axillary, resp. rate (!) 21, height 5\' 2"  (1.575 m), weight 49 kg, SpO2 100 %.    Vent Mode: CPAP;PSV FiO2 (%):  [40 %] 40 % Set Rate:  [18 bmp] 18 bmp Vt Set:  [400 mL] 400 mL PEEP:  [5 cmH20] 5 cmH20 Pressure Support:  [5 cmH20-10 cmH20] 5 cmH20 Plateau Pressure:  [14 cmH20] 14 cmH20    Intake/Output Summary (Last 24 hours) at 04/11/2020 1121 Last data filed at 04/11/2020 1000 Gross per 24 hour  Intake 680 ml  Output 606 ml  Net 74 ml   Filed Weights   04/29/2020 0300  Weight: 49 kg   General:  Critically ill F, intubated, off sedation HEENT: MM pink/moist, ETT in place Neuro: opens R eye to sternal rub today, flicker of wrist movement bilaterally to command, breathing over the vent with +corneals and gag CV: s1s2 rrr, no m/r/g PULM:  CTAB, on full vent support, triggering breaths GI: soft, bsx4 active  Extremities: warm/dry, no edema  Skin: no rashes or lesions   Resolved Hospital Problem list     Assessment & Plan:  This is an 86 year old normally on Coumadin for history of paroxysmal atrial fibrillation who presented with altered mental status and the finding of a very large left subdural hematoma.  She is status post craniotomy for evacuation.  Post procedure CT scan shows resolution of the midline shift with substantial pneumocephalus.  Her Coumadin was reversed with [place] and vitamin K.    Large subdural herniation s/p craniotomy Intubated and post-surgical mental status precluded extubation. Neuro exam is poor, per NS very guarded prognosis as is not requiring any sedation, working to find medical decision-maker -Maintain full vent support with SAT/SBT as tolerated -titrate Vent setting to maintain SpO2 greater than or equal to 90%. -HOB elevated 30 degrees. -Plateau pressures less than 30 cm H20.  -Follow chest x-ray, ABG prn.   -Bronchial hygiene and RT/bronchodilator protocol.  Best practice:  Diet: start TF while clarifying GOC Pain/Anxiety/Delirium protocol (if indicated):  VAP protocol (if indicated): In place DVT prophylaxis:  GI prophylaxis: Pepcid has been ordered Glucose control: No hypoglycemic agent indicated Mobility: Bedrest Code Status: full code Family Communication: per primary Disposition: ICU  Labs   CBC: Recent  Labs  Lab 04/28/2020 0226 04/17/2020 0226 04/26/2020 0331 04/09/2020 0902 04/26/2020 1642 04/09/20 0749 04/09/20 0751  WBC 10.4  --   --  7.5  --   --  10.2  NEUTROABS 8.6*  --   --   --   --   --  8.6*  HGB 13.1   < > 13.9 10.2* 10.2* 10.2* 10.7*  HCT 40.0   < > 41.0 30.6* 30.0* 30.0* 32.0*  MCV 92.2  --   --  93.0  --   --  93.8  PLT 191  --   --  153  --   --  164   < > = values in this interval not displayed.    Basic Metabolic Panel: Recent Labs  Lab 03/31/2020 0226 04/30/2020 0226 04/29/2020 0331 04/04/2020 1642 04/09/20 0749 04/09/20 0751 04/10/20 0555  NA 136   < > 136 138 137 136 134*  K 3.6   < > 3.1* 3.6 3.2* 3.4* 3.8  CL 99  --   --   --   --  104 104  CO2 23  --   --   --   --  23 20*  GLUCOSE 135*  --   --   --   --  112* 111*  BUN 16  --   --   --   --  10 17  CREATININE 0.96  --   --   --   --  0.74 0.94  CALCIUM 9.1  --   --   --   --  8.5* 8.4*   < > = values in this interval not displayed.   GFR: Estimated Creatinine Clearance: 33.2 mL/min (by C-G formula based on SCr of 0.94 mg/dL). Recent Labs  Lab 04/06/2020 0226 04/12/2020 0902 04/09/20 0751  WBC 10.4 7.5 10.2    Liver Function Tests: Recent Labs  Lab 04/28/2020 0226  AST 51*  ALT 37  ALKPHOS 46  BILITOT 0.6  PROT 7.0  ALBUMIN 4.3   No results for input(s): LIPASE, AMYLASE in the last 168 hours. Recent Labs  Lab 04/26/2020 0226  AMMONIA 20    ABG    Component Value Date/Time   PHART 7.466 (H) 04/09/2020 0749   PCO2ART 35.1 04/09/2020 0749   PO2ART 229 (H) 04/09/2020 0749   HCO3 25.3 04/09/2020 0749   TCO2 26 04/09/2020 0749   O2SAT 100.0 04/09/2020 0749     Coagulation Profile: Recent Labs  Lab 04/20/2020 1620 04/09/20 0231 04/09/20 0547 04/10/20 0555 04/11/20 0426  INR 1.2 1.0 1.0 1.1 1.0    Cardiac Enzymes: No results for input(s): CKTOTAL, CKMB, CKMBINDEX, TROPONINI in the last 168 hours.  HbA1C: Hgb A1c MFr Bld  Date/Time Value Ref Range Status  09/18/2015 10:39 AM 5.7 (H)  <5.7 % Final    Comment:  According to the ADA Clinical Practice Recommendations for 2011, when HbA1c is used as a screening test:     >=6.5%   Diagnostic of Diabetes Mellitus            (if abnormal result is confirmed)   5.7-6.4%   Increased risk of developing Diabetes Mellitus   References:Diagnosis and Classification of Diabetes Mellitus,Diabetes XBWI,2035,59(RCBUL 1):S62-S69 and Standards of Medical Care in         Diabetes - 2011,Diabetes AGTX,6468,03 (Suppl 1):S11-S61.       CBG: Recent Labs  Lab 04/22/2020 0223  GLUCAP 126*    Review of Systems:   Not obtainable  Past Medical History  She,  has a past medical history of Acute pericarditis (11/01/2015), CALCANEAL FRACTURE, RIGHT (11/04/2007), Essential hypertension, GERD (gastroesophageal reflux disease), Hyperlipidemia LDL goal <70 (02/08/2016), KNEE PAIN, RIGHT, CHRONIC (03/19/2010), Lung nodule, Mild Aortic insufficiency, Mild Mitral regurgitation, Moderate tricuspid regurgitation, Nonobstructive CAD (coronary artery disease), OSTEOPOROSIS (05/02/2007), PAF (paroxysmal atrial fibrillation) ([place]), PEPTIC ULCER DISEASE (05/02/2007), and Syncope.   Surgical History    Past Surgical History:  Procedure Laterality Date  . Calcaneal fracture, right    . CHOLECYSTECTOMY    . CRANIOTOMY Left 04/07/2020   Procedure: LEFT CRANIOTOMY HEMATOMA EVACUATION SUBDURAL;  Surgeon: Judith Part, MD;  Location: [facility];  Service: Neurosurgery;  Laterality: Left;  . DILATION AND CURETTAGE OF UTERUS    . ESOPHAGOGASTRODUODENOSCOPY N/A 03/30/2013   Procedure: ESOPHAGOGASTRODUODENOSCOPY (EGD);  Surgeon: Jerene Bears, MD;  Location: [facility];  Service: Endoscopy;  Laterality: N/A;  . FUDUCIAL PLACEMENT N/A 09/29/2015   Procedure:  PLACEMENT OF FUDUCIAL Marker times three;  Surgeon: Grace Isaac, MD;  Location: [facility];  Service: Thoracic;  Laterality: N/A;  . LUNG  BIOPSY N/A 09/29/2015   Procedure: LUNG BIOPSY;  Surgeon: Grace Isaac, MD;  Location: [facility];  Service: Thoracic;  Laterality: N/A;  . ORIF HIP FRACTURE    . TONSILLECTOMY    . VIDEO BRONCHOSCOPY WITH ENDOBRONCHIAL NAVIGATION N/A 09/29/2015   Procedure: VIDEO BRONCHOSCOPY WITH ENDOBRONCHIAL NAVIGATION;  Surgeon: Grace Isaac, MD;  Location: [facility];  Service: Thoracic;  Laterality: N/A;     Social History   reports that she has never smoked. She has never used smokeless tobacco. She reports current alcohol use of about 1.0 standard drink of alcohol per week. She reports that she does not use drugs.   Family History   Her family history includes Cancer in her father; Heart disease in her mother. There is no history of Diabetes, Hyperlipidemia, or Hypertension.   Allergies Allergies  Allergen Reactions  . Peanut-Containing Drug Products Other (See Comments)    angioedema  . Peanut-Containing Drug Products Rash     Home Medications  Prior to Admission medications   Medication Sig Start Date End Date Taking? Authorizing Provider  amiodarone (PACERONE) 200 MG tablet Take 1 tablet (200 mg total) by mouth every 12 (twelve) hours. 1 tab bid till 09/13, then 1 tab daily. 03/06/20   Barrett, Evelene Croon, PA-C  ascorbic acid (VITAMIN C) 500 MG tablet Take 1,000 mg by mouth 2 (two) times daily.    [provider]  atorvastatin (LIPITOR) 20 MG tablet Take 1 tablet (20 mg total) by mouth daily at 6 PM. 08/18/19   Turner, Eber Hong, MD  Bromfenac Sodium (PROLENSA) 0.07 % SOLN Place 1 drop into the right eye at bedtime.     [provider]  cyanocobalamin 2000 MCG tablet Take 2,000  mcg by mouth daily.    [provider]  diltiazem (CARDIZEM) 30 MG tablet Take 1 tablet (30 mg total) by mouth 2 (two) times daily as needed (RATE CONTROL). 03/17/20   Furth, Cadence H, PA-C  moxifloxacin (VIGAMOX) 0.5 % ophthalmic solution Place 1 drop into the right eye 4 (four) times daily.   02/08/20   [provider]  Multiple Vitamin (MULTIVITAMIN) tablet Take 1 tablet by mouth daily.    [provider]  nitroGLYCERIN (NITROSTAT) 0.4 MG SL tablet Place 0.4 mg under the tongue every 5 (five) minutes as needed for chest pain.    [provider]  prednisoLONE acetate (PRED FORTE) 1 % ophthalmic suspension Place 1 drop into the right eye 4 (four) times daily.  02/08/20   [provider]  warfarin (COUMADIN) 5 MG tablet TAKE 1 AND 1/2 TABLET DAILY EXCEPT 2 TABLETS ON TUESDAY AND THURSDAY OR AS DIRECTED BY COUMADIN CLINIC 01/06/20   Sueanne Margarita, MD     CRITICAL CARE Performed by: Otilio Carpen Essense Bousquet   Total critical care time: 30 minutes  Critical care time was exclusive of separately billable procedures and treating other patients.  Critical care was necessary to treat or prevent imminent or life-threatening deterioration.  Critical care was time spent personally by me on the following activities: development of treatment plan with patient and/or surrogate as well as nursing, discussions with consultants, evaluation of patient's response to treatment, examination of patient, obtaining history from patient or surrogate, ordering and performing treatments and interventions, ordering and review of laboratory studies, ordering and review of radiographic studies, pulse oximetry and re-evaluation of patient's condition.  Otilio Carpen Amos Micheals, PA-C [place] [facility]  Pager# [phone], if no answer [phone]

## 2020-04-11 NOTE — Evaluation (Signed)
Physical Therapy Evaluation Patient Details Name: AZIZAH LISLE MRN: [id number] DOB: 21-Jul-1931 Today's Date: 04/11/2020   History of Present Illness  86 year old admitted to [facility] 10/9 with altered mental status and a very large left subdural hematoma, s/p L crani 10/9. PMH includes HLD, CAD, pericarditis, GERD, ORIF R hip.  Clinical Impression   Pt presents with low arousal, LUE/LLE 0/5 strength with extensor tone noted during PROM, impaired R-sided strength, and will presently require total assist for all mobility. Pt to benefit from acute PT to address deficits. Pt with periods of eye opening and command following on R side, no active or purposeful movement noted on L when cued. Pt with head positioned in L cervical rotation and flexion suspect secondary to vent, PT attempted to reposition pt in midline which produced large volume secretions from pt mouth and coughing, RN notified may need further assist with secretion management. PT to continue to follow acutely on trial basis, pt with guarded prognosis presently. PT to progress mobility as tolerated.  Vent: 40%, PEEP 5    Follow Up Recommendations SNF;Supervision/Assistance - 24 hour    Equipment Recommendations  None recommended by PT    Recommendations for Other Services       Precautions / Restrictions Precautions Precautions: Fall Restrictions Weight Bearing Restrictions: No      Mobility  Bed Mobility Overal bed mobility: Needs Assistance             General bed mobility comments: Total assist for positioning LEs, UEs. Unable to perform supine<>sit or rolling given pt arousal.  Transfers                 General transfer comment: unable  Ambulation/Gait             General Gait Details: unable  Stairs            Wheelchair Mobility    Modified Rankin (Stroke Patients Only) Modified Rankin (Stroke Patients Only) Pre-Morbid Rankin Score: No symptoms Modified Rankin: Severe  disability     Balance Overall balance assessment: Needs assistance                                           Pertinent Vitals/Pain Pain Assessment: Faces Faces Pain Scale: Hurts a little bit Pain Location: with PROM LUE Pain Descriptors / Indicators: Grimacing;Discomfort Pain Intervention(s): Limited activity within patient's tolerance;Monitored during session;Repositioned;Relaxation    Home Living Family/patient expects to be discharged to:: Unsure                 Additional Comments: pt intubated, not sedated but low arousal. Per RN, pt has a son in [place] and a roommate.    Prior Function           Comments: unsure of PLOF     Hand Dominance        Extremity/Trunk Assessment   Upper Extremity Assessment Upper Extremity Assessment: LUE deficits/detail;Difficult to assess due to impaired cognition;RUE deficits/detail RUE Deficits / Details: demonstrates min grip when cued, contraction only R biceps LUE Deficits / Details: extensor posturing when PT taking pt through PROM, extensor tone simultaneously in both LUE and LLE with PROM    Lower Extremity Assessment Lower Extremity Assessment: Difficult to assess due to impaired cognition;RLE deficits/detail;LLE deficits/detail RLE Deficits / Details: hypertonicity in hip extensors, knee extensors, adductors; full PROM hip and knee flexion, abd/add. lacking  5* DF. No clonus LLE Deficits / Details: extensor synergy noted with LUE PROM, with discomfort. Full PROM hip and knee flexion, abd/add. lacking 5* DF. No clonus       Communication   Communication: Other (comment) (intubated, low arousal)  Cognition Arousal/Alertness: Lethargic Behavior During Therapy: Flat affect Overall Cognitive Status: Difficult to assess                                 General Comments: Follows one-step commands 100% of the time on the R - "squeeze my hand, bend your elbow, wiggle your toes, straighten  your leg", no command following on L. Opens eyes on command, nods "yes" to PT when asked if she is coughing      General Comments General comments (skin integrity, edema, etc.): vent 40%/PEEP 5. HR 70s-94 bpm, alarm for PAW high and RR high with cervical rotation to midline. Pt holding head in L cervical rotation and flexion.    Exercises General Exercises - Lower Extremity Ankle Circles/Pumps: PROM;Both;5 reps;Supine Heel Slides: PROM;Both;10 reps;Supine Hip ABduction/ADduction: PROM;Both;10 reps;Supine Shoulder Exercises Shoulder Flexion: PROM;Both;10 reps;Supine   Assessment/Plan    PT Assessment Patient needs continued PT services  PT Problem List Decreased strength;Decreased mobility;Decreased safety awareness;Impaired tone;Decreased activity tolerance;Decreased cognition;Decreased coordination;Cardiopulmonary status limiting activity;Pain       PT Treatment Interventions Therapeutic activities;Therapeutic exercise;Patient/family education;Balance training;Functional mobility training;Neuromuscular re-education;Gait training;DME instruction    PT Goals (Current goals can be found in the Care Plan section)  Acute Rehab PT Goals PT Goal Formulation: Patient unable to participate in goal setting Time For Goal Achievement: 04/25/20 Potential to Achieve Goals: Poor    Frequency Min 3X/week   Barriers to discharge        Co-evaluation               AM-PAC PT "6 Clicks" Mobility  Outcome Measure Help needed turning from your back to your side while in a flat bed without using bedrails?: Total Help needed moving from lying on your back to sitting on the side of a flat bed without using bedrails?: Total Help needed moving to and from a bed to a chair (including a wheelchair)?: Total Help needed standing up from a chair using your arms (e.g., wheelchair or bedside chair)?: Total Help needed to walk in hospital room?: Total Help needed climbing 3-5 steps with a railing? :  Total 6 Click Score: 6    End of Session   Activity Tolerance: Patient limited by lethargy Patient left: in bed;with call bell/phone within reach Nurse Communication: Mobility status PT Visit Diagnosis: Hemiplegia and hemiparesis;Muscle weakness (generalized) (M62.81) Hemiplegia - Right/Left: Right Hemiplegia - dominant/non-dominant: Dominant Hemiplegia - caused by: Nontraumatic intracerebral hemorrhage    Time: 0910-0933 PT Time Calculation (min) (ACUTE ONLY): 23 min   Charges:   PT Evaluation $PT Eval Low Complexity: 1 Low         Raheel Kunkle E, PT Acute Rehabilitation Services Pager [phone]  Office [phone]    Orry Sigl D Elonda Husky 04/11/2020, 9:48 AM

## 2020-04-11 NOTE — Evaluation (Signed)
Occupational Therapy Evaluation Patient Details Name: Cynthia Vargas MRN: [id number] DOB: 01-Mar-1932 Today's Date: 04/11/2020    History of Present Illness 86 year old admitted to [facility] 10/9 with altered mental status and a very large left subdural hematoma, s/p L crani 10/9. PMH includes HLD, CAD, pericarditis, GERD, ORIF R hip.   Clinical Impression   This 86 y/o female presents with the above. Pt currently intubated (FiO2 40%, PEEP5), off sedation but with poor arousal. Pt intermittently following basic commands (squeezing therapist's hands on 3/4 trials using RUE), but with no active or volitional movement noted with LUE. Pt arching eyebrows when instructed to open eyes but unable to fully open at this time. Use of +2 totalA to reposition in bed as pt with notable L lateral lean/cervical L lateral flexion and with coughing/copious secretions with attempts to reposition. She is currently totalA for ADL. Acute OT to continue following at this time in hopes that pt will have improved arousal levels. Will likely require post acute rehab at time of discharge.     Follow Up Recommendations  SNF;LTACH;Supervision/Assistance - 24 hour    Equipment Recommendations  Other (comment) (TBD)           Precautions / Restrictions Precautions Precautions: Fall Precaution Comments: vent, EVD Restrictions Weight Bearing Restrictions: No      Mobility Bed Mobility Overal bed mobility: Needs Assistance             General bed mobility comments: pt requiring totalA+2 to boost towards [facility] and reposition with pillow under L hip/side. pt with tendency to lean laterally to the L and maintaining neck in L lateral flexion - improved once repositioned at bed level   Transfers                 General transfer comment: unable        ADL either performed or assessed with clinical judgement   ADL Overall ADL's : Needs assistance/impaired                                        General ADL Comments: pt currently totalA, with copious secretions. attempted hand over hand for simple grooming task (face washing) using RUE but pt with minimal to no attempt to carry over task on her own                          Pertinent Vitals/Pain Pain Assessment: Faces Faces Pain Scale: Hurts a little bit Pain Location: generalized discomfort when coughing  Pain Descriptors / Indicators: Discomfort Pain Intervention(s): Monitored during session     Hand Dominance     Extremity/Trunk Assessment Upper Extremity Assessment Upper Extremity Assessment: Generalized weakness;Difficult to assess due to impaired cognition;RUE deficits/detail;LUE deficits/detail RUE Deficits / Details: pt squeezing therapists hand with RUE, noted muscle activation with ROM attempts but no volitional movement or attempt to carry over movement on her own RUE Coordination: decreased fine motor;decreased gross motor LUE Deficits / Details: able to move through PROM but with no active muscle movement noted. x1 period of extensor tone while coughing on vent  LUE Coordination: decreased fine motor;decreased gross motor   Lower Extremity Assessment Lower Extremity Assessment: Defer to PT evaluation   Cervical / Trunk Assessment Cervical / Trunk Assessment: Other exceptions Cervical / Trunk Exceptions: pt with tendency to demonstrate cervical L lateral flexion    Communication  Communication Communication: Other (comment) (intubated, low arousal)   Cognition Arousal/Alertness: Lethargic Behavior During Therapy: Flat affect Overall Cognitive Status: Difficult to assess                                 General Comments: pt squeezing therapist's hands 3/4 trials during session, raised her eyebrows x1 when asking her to open her eyes. with no other command follow noted at this time    General Comments       Exercises Exercises: Other exercises Other Exercises Other Exercises:  PROM to bil UE across multiple planes    Shoulder Instructions      Home Living Family/patient expects to be discharged to:: Unsure                                 Additional Comments: pt intubated, not sedated but low arousal. Per RN, pt has a son in [place] and a roommate. Attempts being made to contact son.      Prior Functioning/Environment          Comments: unsure of PLOF        OT Problem List: Decreased strength;Decreased range of motion;Decreased activity tolerance;Impaired balance (sitting and/or standing);Decreased coordination;Decreased cognition;Decreased safety awareness;Cardiopulmonary status limiting activity;Decreased knowledge of use of DME or AE;Impaired UE functional use      OT Treatment/Interventions: Self-care/ADL training;Therapeutic exercise;Energy conservation;DME and/or AE instruction;Therapeutic activities;Cognitive remediation/compensation;Patient/family education;Balance training;Splinting    OT Goals(Current goals can be found in the care plan section) Acute Rehab OT Goals Patient Stated Goal: unable to participate OT Goal Formulation: Patient unable to participate in goal setting Time For Goal Achievement: 04/25/20 Potential to Achieve Goals: Fair  OT Frequency: Min 2X/week   Barriers to D/C:            Co-evaluation              AM-PAC OT "6 Clicks" Daily Activity     Outcome Measure Help from another person eating meals?: Total Help from another person taking care of personal grooming?: Total Help from another person toileting, which includes using toliet, bedpan, or urinal?: Total Help from another person bathing (including washing, rinsing, drying)?: Total Help from another person to put on and taking off regular upper body clothing?: Total Help from another person to put on and taking off regular lower body clothing?: Total 6 Click Score: 6   End of Session Equipment Utilized During Treatment: Oxygen Nurse  Communication: Mobility status  Activity Tolerance: Patient limited by lethargy Patient left: in bed;with call bell/phone within reach;with nursing/sitter in room  OT Visit Diagnosis: Muscle weakness (generalized) (M62.81);Other symptoms and signs involving cognitive function;Adult, failure to thrive (R62.7)                Time: 6433-2951 OT Time Calculation (min): 22 min Charges:  OT General Charges $OT Visit: 1 Visit OT Evaluation $OT Eval High Complexity: 1 High  Lou Cal, OT Acute Rehabilitation Services Pager [phone] Office [phone]   [name] 04/11/2020, 2:05 PM

## 2020-04-11 NOTE — Progress Notes (Signed)
I got in contact with Cynthia Vargas's son. He understands her current condition and I explained my expectations for her recovery. Given her flaccid L side and increased tone that has developed on the L, I do not think she will recover any useful function on the left side and therefore be bedbound. He confirmed my suspicion that he does not think she would like to live like that. He would like to see her before she dies, if possible. He lives in [place], will send a supporting letter to the [facility] Dept to expedite the process. Likely will withdraw care after his arrival.

## 2020-04-11 NOTE — Progress Notes (Signed)
Neurosurgery Service Progress Note  Subjective: No acute events overnight.  Objective: Vitals:   04/11/20 0800 04/11/20 0828 04/11/20 0900 04/11/20 1000  BP: 123/70 123/70 136/73 120/74  Pulse: 72  74 77  Resp: 18  20 (!) 21  Temp: 97.7 F (36.5 C)     TempSrc: Axillary     SpO2: 100%  100% 100%  Weight:      Height:       Temp (24hrs), Avg:98.6 F (37 C), Min:97.7 F (36.5 C), Max:99.7 F (37.6 C)  CBC Latest Ref Rng & Units 04/09/2020 04/09/2020 04/22/2020  WBC 4.0 - 10.5 K/uL 10.2 - -  Hemoglobin 12.0 - 15.0 g/dL 10.7(L) 10.2(L) 10.2(L)  Hematocrit 36 - 46 % 32.0(L) 30.0(L) 30.0(L)  Platelets 150 - 400 K/uL 164 - -   BMP Latest Ref Rng & Units 04/10/2020 04/09/2020 04/09/2020  Glucose 70 - 99 mg/dL 111(H) 112(H) -  BUN 8 - 23 mg/dL 17 10 -  Creatinine 0.44 - 1.00 mg/dL 0.94 0.74 -  BUN/Creat Ratio 12 - 28 - - -  Sodium 135 - 145 mmol/L 134(L) 136 137  Potassium 3.5 - 5.1 mmol/L 3.8 3.4(L) 3.2(L)  Chloride 98 - 111 mmol/L 104 104 -  CO2 22 - 32 mmol/L 20(L) 23 -  Calcium 8.9 - 10.3 mg/dL 8.4(L) 8.5(L) -    Intake/Output Summary (Last 24 hours) at 04/11/2020 1041 Last data filed at 04/11/2020 1000 Gross per 24 hour  Intake 680 ml  Output 606 ml  Net 74 ml    Current Facility-Administered Medications:  .  acetaminophen (TYLENOL) tablet 650 mg, 650 mg, Per Tube, Q4H PRN **OR** acetaminophen (TYLENOL) suppository 650 mg, 650 mg, Rectal, Q4H PRN, Judith Part, MD .  amiodarone (PACERONE) tablet 200 mg, 200 mg, Per Tube, Daily, Judith Part, MD, 200 mg at 04/11/20 1008 .  atorvastatin (LIPITOR) tablet 20 mg, 20 mg, Per Tube, q1800, Judith Part, MD, 20 mg at 04/10/20 1802 .  chlorhexidine gluconate (MEDLINE KIT) (PERIDEX) 0.12 % solution 15 mL, 15 mL, Mouth Rinse, BID, Ostergard, Joyice Faster, MD, 15 mL at 04/11/20 0729 .  Chlorhexidine Gluconate Cloth 2 % PADS 6 each, 6 each, Topical, Daily, Judith Part, MD, 6 each at 04/11/20 1000 .   clevidipine (CLEVIPREX) infusion 0.5 mg/mL, 0-21 mg/hr, Intravenous, Continuous, Ostergard, Joyice Faster, MD, Stopped at 04/09/20 1047 .  diltiazem (CARDIZEM) tablet 30 mg, 30 mg, Oral, BID PRN, Judith Part, MD .  docusate (COLACE) 50 MG/5ML liquid 100 mg, 100 mg, Per Tube, BID, Judith Part, MD, 100 mg at 04/11/20 1009 .  feeding supplement (VITAL AF 1.2 CAL) liquid 1,000 mL, 1,000 mL, Per Tube, Continuous, Gleason, Otilio Carpen, PA-C, Last Rate: 40 mL/hr at 04/10/20 1700, 1,000 mL at 04/10/20 1700 .  heparin injection 5,000 Units, 5,000 Units, Subcutaneous, Q8H, Judith Part, MD, 5,000 Units at 04/11/20 0610 .  HYDROcodone-acetaminophen (NORCO/VICODIN) 5-325 MG per tablet 1 tablet, 1 tablet, Per Tube, Q4H PRN, Judith Part, MD .  HYDROmorphone (DILAUDID) injection 0.5 mg, 0.5 mg, Intravenous, Q3H PRN, Judith Part, MD .  labetalol (NORMODYNE) injection 10-40 mg, 10-40 mg, Intravenous, Q10 min PRN, Judith Part, MD, 20 mg at 04/09/20 2238 .  MEDLINE mouth rinse, 15 mL, Mouth Rinse, 10 times per day, Judith Part, MD, 15 mL at 04/11/20 1010 .  ondansetron (ZOFRAN) tablet 4 mg, 4 mg, Per Tube, Q4H PRN **OR** ondansetron (ZOFRAN) injection 4 mg, 4 mg, Intravenous,  Q4H PRN, Judith Part, MD .  polyethylene glycol (MIRALAX / GLYCOLAX) packet 17 g, 17 g, Per Tube, Daily PRN, Ostergard, Joyice Faster, MD .  promethazine (PHENERGAN) tablet 12.5-25 mg, 12.5-25 mg, Oral, Q4H PRN, Ostergard, Thomas A, MD .  propofol (DIPRIVAN) 1000 MG/100ML infusion, 5-80 mcg/kg/min, Intravenous, Continuous, Ripley Fraise, MD, Stopped at 04/30/2020 1700   Physical Exam: Intubated, no sedation, +cough/gag/corneal, open her eyes once to voice, consistent raises her eyebrows to name calling,  follow commands on RLE, intermittently FC on RUE. Extension to pain LUE   Assessment & Plan: 86 y.o. woman w/ large SDH and herniation with blown pupil on arrival s/p L craniotomy for  evacuation, LUE extension to pain  -will call her son today in [place]; will discuss with him regarding goals of care.  Osie Cheeks, NP 04/11/20 10:41 AM

## 2020-04-12 DIAGNOSIS — Z9911 Dependence on respirator [ventilator] status: Secondary | ICD-10-CM | POA: Diagnosis not present

## 2020-04-12 MED ORDER — LIDOCAINE HCL (PF) 1 % IJ SOLN
INTRAMUSCULAR | Status: AC
  Administered 2020-04-12: 30 mL
  Filled 2020-04-12: qty 30

## 2020-04-12 NOTE — Progress Notes (Addendum)
Neurosurgery Service Progress Note  Subjective: No acute events overnight.   Objective: Vitals:   04/12/20 0700 04/12/20 0800 04/12/20 0812 04/12/20 1150  BP: 105/62 134/71 134/71 (!) 163/76  Pulse: 66 69 67 70  Resp: _0 Temp:   (!) 97.5 F (36.4 C)   TempSrc:      SpO2: 100% 100% 100% 100%  Weight:      Height:       Temp (24hrs), Avg:98.1 F (36.7 C), Min:97.4 F (36.3 C), Max:98.9 F (37.2 C)  CBC Latest Ref Rng & Units 04/09/2020 04/09/2020 04/20/2020  WBC 4.0 - 10.5 K/uL 10.2 - -  Hemoglobin 12.0 - 15.0 g/dL 10.7(L) 10.2(L) 10.2(L)  Hematocrit 36 - 46 % 32.0(L) 30.0(L) 30.0(L)  Platelets 150 - 400 K/uL 164 - -   BMP Latest Ref Rng & Units 04/10/2020 04/09/2020 04/09/2020  Glucose 70 - 99 mg/dL 111(H) 112(H) -  BUN 8 - 23 mg/dL 17 10 -  Creatinine 0.44 - 1.00 mg/dL 0.94 0.74 -  BUN/Creat Ratio 12 - 28 - - -  Sodium 135 - 145 mmol/L 134(L) 136 137  Potassium 3.5 - 5.1 mmol/L 3.8 3.4(L) 3.2(L)  Chloride 98 - 111 mmol/L 104 104 -  CO2 22 - 32 mmol/L 20(L) 23 -  Calcium 8.9 - 10.3 mg/dL 8.4(L) 8.5(L) -    Intake/Output Summary (Last 24 hours) at 04/12/2020 1242 Last data filed at 04/12/2020 0800 Gross per 24 hour  Intake 850 ml  Output 532 ml  Net 318 ml    Current Facility-Administered Medications:  .  acetaminophen (TYLENOL) tablet 650 mg, 650 mg, Per Tube, Q4H PRN **OR** acetaminophen (TYLENOL) suppository 650 mg, 650 mg, Rectal, Q4H PRN, Judith Part, MD .  amiodarone (PACERONE) tablet 200 mg, 200 mg, Per Tube, Daily, Judith Part, MD, 200 mg at 04/12/20 1013 .  atorvastatin (LIPITOR) tablet 20 mg, 20 mg, Per Tube, q1800, Judith Part, MD, 20 mg at 04/11/20 1727 .  chlorhexidine gluconate (MEDLINE KIT) (PERIDEX) 0.12 % solution 15 mL, 15 mL, Mouth Rinse, BID, Ostergard, Joyice Faster, MD, 15 mL at 04/12/20 0747 .  Chlorhexidine Gluconate Cloth 2 % PADS 6 each, 6 each, Topical, Daily, Judith Part, MD, 6 each at 04/12/20 1013 .   diltiazem (CARDIZEM) tablet 30 mg, 30 mg, Oral, BID PRN, Judith Part, MD .  docusate (COLACE) 50 MG/5ML liquid 100 mg, 100 mg, Per Tube, BID, Judith Part, MD, 100 mg at 04/12/20 1013 .  feeding supplement (VITAL AF 1.2 CAL) liquid 1,000 mL, 1,000 mL, Per Tube, Continuous, Gleason, Otilio Carpen, PA-C, Last Rate: 40 mL/hr at 04/10/20 1700, 1,000 mL at 04/10/20 1700 .  heparin injection 5,000 Units, 5,000 Units, Subcutaneous, Q8H, Judith Part, MD, 5,000 Units at 04/12/20 [phone] .  HYDROcodone-acetaminophen (NORCO/VICODIN) 5-325 MG per tablet 1 tablet, 1 tablet, Per Tube, Q4H PRN, Judith Part, MD .  HYDROmorphone (DILAUDID) injection 0.5 mg, 0.5 mg, Intravenous, Q3H PRN, Judith Part, MD .  labetalol (NORMODYNE) injection 10-40 mg, 10-40 mg, Intravenous, Q10 min PRN, Judith Part, MD, 20 mg at 04/09/20 2238 .  MEDLINE mouth rinse, 15 mL, Mouth Rinse, 10 times per day, Judith Part, MD, 15 mL at 04/12/20 1013 .  ondansetron (ZOFRAN) tablet 4 mg, 4 mg, Per Tube, Q4H PRN **OR** ondansetron (ZOFRAN) injection 4 mg, 4 mg, Intravenous, Q4H PRN, Ostergard, Thomas A, MD .  polyethylene glycol (MIRALAX / GLYCOLAX) packet 17 g, 17  g, Per Tube, Daily PRN, Judith Part, MD .  promethazine (PHENERGAN) tablet 12.5-25 mg, 12.5-25 mg, Oral, Q4H PRN, Judith Part, MD .  propofol (DIPRIVAN) 1000 MG/100ML infusion, 5-80 mcg/kg/min, Intravenous, Continuous, Ripley Fraise, MD, Stopped at 04/11/2020 1700   Physical Exam: Intubation, no sedation, +c/c/g, eyes closed to voice, follow commands RLE, intermittently FC on RUE, in response to stimulation she extends her left arm. Flaccid L side and increased tone L Incision c/d/i  Assessment & Plan: 86 y.o. woman w/ large SDH and herniation with blown pupil on arrival s/p L craniotomy for evacuation.   -Subdural drain removed this morning 10/13 -Dr. Zada Finders was able to reach her son yesterday who lives in [place].  They discussed in great details about her prognosis. He would like to see her before she dies, if possible.  Will likely to withdraw care after his arrival.   Osie Cheeks, NP 04/12/20 12:42 PM

## 2020-04-12 NOTE — Progress Notes (Signed)
NAME:  Cynthia Vargas, MRN:  [id number], DOB:  1932-03-06, LOS: 4 ADMISSION DATE:  04/13/2020, CONSULTATION DATE:  04/07/2020 REFERRING MD: Zada Finders , CHIEF COMPLAINT: Altered mental status   Brief History   86 year old normally on Coumadin for paroxysmal atrial fibrillation who presented with altered mental status and a very large left subdural hematoma  History of present illness   Patient apparently complained of a headache and then became unresponsive.  She was brought to the department of emergency medicine she was posturing.  She was intubated for decreased mental status and a CT scan of the head obtained which showed a large left subdural hematoma with substantial midline shift.  She was subsequently taken for a craniotomy after reversal of her Coumadin and arrives in the intensive care unit intubated sedated and mechanically ventilated.  We can obtain no history from the patient.  Past Medical History  Chart indicates a history of hyperlipidemia, coronary artery disease, distant history of pericarditis, history of hyperlipidemia, and a history of GERD. Surgical history is remarkable for cholecystectomy ORIF of the right hip, tonsillectomy, and lung mass biopsy.  Significant Hospital Events   This is postoperative day #1.  She has had no significant subjective change overnight, she has not received any sedation overnight.  Consults:    Procedures:  Craniotomy on 10/9  Significant Diagnostic Tests:    Micro Data:    Antimicrobials:   ancef 10/8-  Interim history/subjective:  As above  Objective   Blood pressure 134/71, pulse 67, temperature 98.2 F (36.8 C), temperature source Axillary, resp. rate 18, height 5\' 2"  (1.575 m), weight 49 kg, SpO2 100 %.    Vent Mode: PSV;CPAP FiO2 (%):  [40 %] 40 % Set Rate:  [16 bmp] 16 bmp Vt Set:  [400 mL] 400 mL PEEP:  [5 cmH20] 5 cmH20 Pressure Support:  [5 cmH20-8 cmH20] 8 cmH20 Plateau Pressure:  [14 cmH20-15 cmH20] 14  cmH20   Intake/Output Summary (Last 24 hours) at 04/12/2020 0820 Last data filed at 04/12/2020 0600 Gross per 24 hour  Intake 930 ml  Output 607 ml  Net 323 ml   Filed Weights   04/09/2020 0300  Weight: 49 kg   General:  Critically ill intubated F, no sedation HEENT: MM pink/moist, ETT in place  Neuro: no sedation, grimaces and withdraws to pain in upper extremities, resists eye opening on the R, +gag CV: s1s2 rrr, no m/r/g PULM:  CTAB on full vent support with stable, minimal settings  GI: soft, bsx4 active  Extremities: warm/dry, no edema  Skin: no rashes or lesions    Resolved Hospital Problem list     Assessment & Plan:  This is an 86 year old normally on Coumadin for history of paroxysmal atrial fibrillation who presented with altered mental status and the finding of a very large left subdural hematoma.  She is status post craniotomy for evacuation.  Post procedure CT scan shows resolution of the midline shift with substantial pneumocephalus.  Her Coumadin was reversed with [place] and vitamin K.    Large subdural herniation s/p craniotomy with secondary respiratory failure Intubated and post-surgical mental status precluded extubation. Neuro exam is poor, per NS very guarded prognosis as is not requiring any sedation P: -per neurosurgery poor prognosis with likely chronic bed-bound state, son is decision maker and is attempting to travel to see patient before likely transition to comfort care -requiring minimal vent settings, maintain full vent support with SAT/SBT as tolerated -titrate Vent setting to maintain SpO2 greater  than or equal to 90%. -HOB elevated 30 degrees. -Plateau pressures less than 30 cm H20.  -Follow chest x-ray, ABG prn.   -Bronchial hygiene and RT/bronchodilator protocol.       Best practice:  Diet: start TF while clarifying GOC Pain/Anxiety/Delirium protocol (if indicated):  VAP protocol (if indicated): In place DVT prophylaxis:  GI  prophylaxis: Pepcid has been ordered Glucose control: No hypoglycemic agent indicated Mobility: Bedrest Code Status: full code Family Communication: per primary Disposition: ICU  Labs   CBC: Recent Labs  Lab 04/11/2020 0226 04/13/2020 0226 04/19/2020 0331 04/14/2020 0902 04/15/2020 1642 04/09/20 0749 04/09/20 0751  WBC 10.4  --   --  7.5  --   --  10.2  NEUTROABS 8.6*  --   --   --   --   --  8.6*  HGB 13.1   < > 13.9 10.2* 10.2* 10.2* 10.7*  HCT 40.0   < > 41.0 30.6* 30.0* 30.0* 32.0*  MCV 92.2  --   --  93.0  --   --  93.8  PLT 191  --   --  153  --   --  164   < > = values in this interval not displayed.    Basic Metabolic Panel: Recent Labs  Lab 04/07/2020 0226 04/07/2020 0226 04/16/2020 0331 04/15/2020 1642 04/09/20 0749 04/09/20 0751 04/10/20 0555  NA 136   < > 136 138 137 136 134*  K 3.6   < > 3.1* 3.6 3.2* 3.4* 3.8  CL 99  --   --   --   --  104 104  CO2 23  --   --   --   --  23 20*  GLUCOSE 135*  --   --   --   --  112* 111*  BUN 16  --   --   --   --  10 17  CREATININE 0.96  --   --   --   --  0.74 0.94  CALCIUM 9.1  --   --   --   --  8.5* 8.4*   < > = values in this interval not displayed.   GFR: Estimated Creatinine Clearance: 33.2 mL/min (by C-G formula based on SCr of 0.94 mg/dL). Recent Labs  Lab 04/14/2020 0226 04/05/2020 0902 04/09/20 0751  WBC 10.4 7.5 10.2    Liver Function Tests: Recent Labs  Lab 04/20/2020 0226  AST 51*  ALT 37  ALKPHOS 46  BILITOT 0.6  PROT 7.0  ALBUMIN 4.3   No results for input(s): LIPASE, AMYLASE in the last 168 hours. Recent Labs  Lab 04/27/2020 0226  AMMONIA 20    ABG    Component Value Date/Time   PHART 7.466 (H) 04/09/2020 0749   PCO2ART 35.1 04/09/2020 0749   PO2ART 229 (H) 04/09/2020 0749   HCO3 25.3 04/09/2020 0749   TCO2 26 04/09/2020 0749   O2SAT 100.0 04/09/2020 0749     Coagulation Profile: Recent Labs  Lab 04/12/2020 1620 04/09/20 0231 04/09/20 0547 04/10/20 0555 04/11/20 0426  INR 1.2 1.0 1.0  1.1 1.0    Cardiac Enzymes: No results for input(s): CKTOTAL, CKMB, CKMBINDEX, TROPONINI in the last 168 hours.  HbA1C: Hgb A1c MFr Bld  Date/Time Value Ref Range Status  09/18/2015 10:39 AM 5.7 (H) <5.7 % Final    Comment:  According to the ADA Clinical Practice Recommendations for 2011, when HbA1c is used as a screening test:     >=6.5%   Diagnostic of Diabetes Mellitus            (if abnormal result is confirmed)   5.7-6.4%   Increased risk of developing Diabetes Mellitus   References:Diagnosis and Classification of Diabetes Mellitus,Diabetes VOJJ,0093,81(WEXHB 1):S62-S69 and Standards of Medical Care in         Diabetes - 2011,Diabetes ZJIR,6789,38 (Suppl 1):S11-S61.       CBG: Recent Labs  Lab 04/02/2020 0223  GLUCAP 126*    Review of Systems:   Not obtainable  Past Medical History  She,  has a past medical history of Acute pericarditis (11/01/2015), CALCANEAL FRACTURE, RIGHT (11/04/2007), Essential hypertension, GERD (gastroesophageal reflux disease), Hyperlipidemia LDL goal <70 (02/08/2016), KNEE PAIN, RIGHT, CHRONIC (03/19/2010), Lung nodule, Mild Aortic insufficiency, Mild Mitral regurgitation, Moderate tricuspid regurgitation, Nonobstructive CAD (coronary artery disease), OSTEOPOROSIS (05/02/2007), PAF (paroxysmal atrial fibrillation) ([place]), PEPTIC ULCER DISEASE (05/02/2007), and Syncope.   Surgical History    Past Surgical History:  Procedure Laterality Date  . Calcaneal fracture, right    . CHOLECYSTECTOMY    . CRANIOTOMY Left 04/22/2020   Procedure: LEFT CRANIOTOMY HEMATOMA EVACUATION SUBDURAL;  Surgeon: Judith Part, MD;  Location: [place];  Service: Neurosurgery;  Laterality: Left;  . DILATION AND CURETTAGE OF UTERUS    . ESOPHAGOGASTRODUODENOSCOPY N/A 03/30/2013   Procedure: ESOPHAGOGASTRODUODENOSCOPY (EGD);  Surgeon: Jerene Bears, MD;  Location: [facility];  Service: Endoscopy;   Laterality: N/A;  . FUDUCIAL PLACEMENT N/A 09/29/2015   Procedure:  PLACEMENT OF FUDUCIAL Marker times three;  Surgeon: Grace Isaac, MD;  Location: [place];  Service: Thoracic;  Laterality: N/A;  . LUNG BIOPSY N/A 09/29/2015   Procedure: LUNG BIOPSY;  Surgeon: Grace Isaac, MD;  Location: [place];  Service: Thoracic;  Laterality: N/A;  . ORIF HIP FRACTURE    . TONSILLECTOMY    . VIDEO BRONCHOSCOPY WITH ENDOBRONCHIAL NAVIGATION N/A 09/29/2015   Procedure: VIDEO BRONCHOSCOPY WITH ENDOBRONCHIAL NAVIGATION;  Surgeon: Grace Isaac, MD;  Location: [place];  Service: Thoracic;  Laterality: N/A;     Social History   reports that she has never smoked. She has never used smokeless tobacco. She reports current alcohol use of about 1.0 standard drink of alcohol per week. She reports that she does not use drugs.   Family History   Her family history includes Cancer in her father; Heart disease in her mother. There is no history of Diabetes, Hyperlipidemia, or Hypertension.   Allergies Allergies  Allergen Reactions  . Peanut-Containing Drug Products Other (See Comments)    angioedema  . Peanut-Containing Drug Products Rash     Home Medications  Prior to Admission medications   Medication Sig Start Date End Date Taking? Authorizing Provider  amiodarone (PACERONE) 200 MG tablet Take 1 tablet (200 mg total) by mouth every 12 (twelve) hours. 1 tab bid till 09/13, then 1 tab daily. 03/06/20   Barrett, Evelene Croon, PA-C  ascorbic acid (VITAMIN C) 500 MG tablet Take 1,000 mg by mouth 2 (two) times daily.    [provider]  atorvastatin (LIPITOR) 20 MG tablet Take 1 tablet (20 mg total) by mouth daily at 6 PM. 08/18/19   Turner, Eber Hong, MD  Bromfenac Sodium (PROLENSA) 0.07 % SOLN Place 1 drop into the right eye at bedtime.     [provider]  cyanocobalamin 2000 MCG tablet Take 2,000  mcg by mouth daily.    [provider]  diltiazem (CARDIZEM) 30 MG tablet Take 1 tablet (30  mg total) by mouth 2 (two) times daily as needed (RATE CONTROL). 03/17/20   Furth, Cadence H, PA-C  moxifloxacin (VIGAMOX) 0.5 % ophthalmic solution Place 1 drop into the right eye 4 (four) times daily.  02/08/20   [provider]  Multiple Vitamin (MULTIVITAMIN) tablet Take 1 tablet by mouth daily.    [provider]  nitroGLYCERIN (NITROSTAT) 0.4 MG SL tablet Place 0.4 mg under the tongue every 5 (five) minutes as needed for chest pain.    [provider]  prednisoLONE acetate (PRED FORTE) 1 % ophthalmic suspension Place 1 drop into the right eye 4 (four) times daily.  02/08/20   [provider]  warfarin (COUMADIN) 5 MG tablet TAKE 1 AND 1/2 TABLET DAILY EXCEPT 2 TABLETS ON TUESDAY AND THURSDAY OR AS DIRECTED BY COUMADIN CLINIC 01/06/20   Sueanne Margarita, MD     CRITICAL CARE Performed by: Otilio Carpen Seirra Kos   Total critical care time: 35 minutes  Critical care time was exclusive of separately billable procedures and treating other patients.  Critical care was necessary to treat or prevent imminent or life-threatening deterioration.  Critical care was time spent personally by me on the following activities: development of treatment plan with patient and/or surrogate as well as nursing, discussions with consultants, evaluation of patient's response to treatment, examination of patient, obtaining history from patient or surrogate, ordering and performing treatments and interventions, ordering and review of laboratory studies, ordering and review of radiographic studies, pulse oximetry and re-evaluation of patient's condition.  Otilio Carpen Shubham Thackston, PA-C [facility]  Pager# [phone], if no answer [phone]

## 2020-04-13 DIAGNOSIS — R0689 Other abnormalities of breathing: Secondary | ICD-10-CM | POA: Diagnosis not present

## 2020-04-13 NOTE — Progress Notes (Addendum)
NAME:  Cynthia Vargas, MRN:  [id number], DOB:  [birth year], LOS: 5 ADMISSION DATE:  04/29/2020, CONSULTATION DATE:  04/30/2020 REFERRING MD: Zada Finders , CHIEF COMPLAINT: Altered mental status   Brief History   86 year old normally on Coumadin for paroxysmal atrial fibrillation who presented with altered mental status and a very large left subdural hematoma  History of present illness   Patient apparently complained of a headache and then became unresponsive.  She was brought to the department of emergency medicine she was posturing.  She was intubated for decreased mental status and a CT scan of the head obtained which showed a large left subdural hematoma with substantial midline shift.  She was subsequently taken for a craniotomy after reversal of her Coumadin and arrives in the intensive care unit intubated sedated and mechanically ventilated.  We can obtain no history from the patient.  Past Medical History  Chart indicates a history of hyperlipidemia, coronary artery disease, distant history of pericarditis, history of hyperlipidemia, and a history of GERD. Surgical history is remarkable for cholecystectomy ORIF of the right hip, tonsillectomy, and lung mass biopsy.  Significant Hospital Events   This is postoperative day #1.  She has had no significant subjective change overnight, she has not received any sedation overnight.  Consults:    Procedures:  Craniotomy on 10/9  Significant Diagnostic Tests:    Micro Data:    Antimicrobials:   ancef 10/8-  Interim history/subjective:  10/14: no acute events overnight. Remains unresponsive on vent. Awaiting family arrival. Spoke with son today and he has received his passport and is going to [place] tomorrow to attempt to get his visa to come directly to [place] within the next few days. In the interim he has agreed to DNR for his mother at this time. Continue current care otherwise.   Objective   Blood pressure 126/77,  pulse 76, temperature 98.1 F (36.7 C), temperature source Axillary, resp. rate 17, height 5\' 2"  (1.575 m), weight 49 kg, SpO2 99 %.    Vent Mode: PRVC FiO2 (%):  [40 %] 40 % Set Rate:  [16 bmp] 16 bmp Vt Set:  [400 mL] 400 mL PEEP:  [5 cmH20] 5 cmH20 Pressure Support:  [8 cmH20] 8 cmH20 Plateau Pressure:  [14 cmH20-15 cmH20] 15 cmH20   Intake/Output Summary (Last 24 hours) at 04/13/2020 5361 Last data filed at 04/13/2020 0700 Gross per 24 hour  Intake 1020 ml  Output 200 ml  Net 820 ml   Filed Weights   04/20/2020 0300  Weight: 49 kg   General:  Critically ill intubated F, no sedation HEENT: MM pink/moist, ETT in place, s/p crainotomy Neuro: off continuous sedation, follows commands on R CV: s1s2 rrr, no m/r/g PULM:  CTAB on full vent support with stable, minimal settings  GI: soft, bsx4 active  Extremities: warm/dry, no edema  Skin: no rashes     Resolved Hospital Problem list     Assessment & Plan:  This is an 86 year old normally on Coumadin for history of paroxysmal atrial fibrillation who presented with altered mental status and the finding of a very large left subdural hematoma.  She is status post craniotomy for evacuation.  Post procedure CT scan shows resolution of the midline shift with substantial pneumocephalus.  Her Coumadin was reversed with [place] and vitamin K.    Large subdural herniation s/p craniotomy with secondary respiratory failure Intubated and post-surgical mental status precluded extubation. Subdural drain removed 10/13 P: -per neurosurgery poor prognosis with likely  chronic bed-bound state, son is Media planner and is attempting to travel to see patient before likely transition to comfort care -requiring minimal vent settings, maintain full vent support with SAT/SBT as tolerated -titrate Vent setting to maintain SpO2 greater than or equal to 90%. -HOB elevated 30 degrees. -Plateau pressures less than 30 cm H20.  -Follow chest x-ray, ABG prn.     -Bronchial hygiene and RT/bronchodilator protocol. -defer to primary for labs in light of devastating event       Best practice:  Diet: start TF while clarifying GOC Pain/Anxiety/Delirium protocol (if indicated):  VAP protocol (if indicated): In place DVT prophylaxis:  GI prophylaxis: Pepcid has been ordered Glucose control: not indicated Mobility: Bedrest Code Status: DNR Family Communication:per primary, but I personally spoke with him today re: goals of care and status update on arrival.  Disposition: ICU  Labs   CBC: Recent Labs  Lab 04/04/2020 0226 04/07/2020 0226 03/31/2020 0331 04/26/2020 0902 04/05/2020 1642 04/09/20 0749 04/09/20 0751  WBC 10.4  --   --  7.5  --   --  10.2  NEUTROABS 8.6*  --   --   --   --   --  8.6*  HGB 13.1   < > 13.9 10.2* 10.2* 10.2* 10.7*  HCT 40.0   < > 41.0 30.6* 30.0* 30.0* 32.0*  MCV 92.2  --   --  93.0  --   --  93.8  PLT 191  --   --  153  --   --  164   < > = values in this interval not displayed.    Basic Metabolic Panel: Recent Labs  Lab 04/05/2020 0226 04/04/2020 0226 04/05/2020 0331 04/06/2020 1642 04/09/20 0749 04/09/20 0751 04/10/20 0555  NA 136   < > 136 138 137 136 134*  K 3.6   < > 3.1* 3.6 3.2* 3.4* 3.8  CL 99  --   --   --   --  104 104  CO2 23  --   --   --   --  23 20*  GLUCOSE 135*  --   --   --   --  112* 111*  BUN 16  --   --   --   --  10 17  CREATININE 0.96  --   --   --   --  0.74 0.94  CALCIUM 9.1  --   --   --   --  8.5* 8.4*   < > = values in this interval not displayed.   GFR: Estimated Creatinine Clearance: 33.2 mL/min (by C-G formula based on SCr of 0.94 mg/dL). Recent Labs  Lab 04/05/2020 0226 04/30/2020 0902 04/09/20 0751  WBC 10.4 7.5 10.2    Liver Function Tests: Recent Labs  Lab 04/03/2020 0226  AST 51*  ALT 37  ALKPHOS 46  BILITOT 0.6  PROT 7.0  ALBUMIN 4.3   No results for input(s): LIPASE, AMYLASE in the last 168 hours. Recent Labs  Lab 04/19/2020 0226  AMMONIA 20    ABG     Component Value Date/Time   PHART 7.466 (H) 04/09/2020 0749   PCO2ART 35.1 04/09/2020 0749   PO2ART 229 (H) 04/09/2020 0749   HCO3 25.3 04/09/2020 0749   TCO2 26 04/09/2020 0749   O2SAT 100.0 04/09/2020 0749     Coagulation Profile: Recent Labs  Lab 04/13/2020 1620 04/09/20 0231 04/09/20 0547 04/10/20 0555 04/11/20 0426  INR 1.2 1.0 1.0 1.1 1.0    Cardiac  Enzymes: No results for input(s): CKTOTAL, CKMB, CKMBINDEX, TROPONINI in the last 168 hours.  HbA1C: Hgb A1c MFr Bld  Date/Time Value Ref Range Status  09/18/2015 10:39 AM 5.7 (H) <5.7 % Final    Comment:                                                                           According to the ADA Clinical Practice Recommendations for 2011, when HbA1c is used as a screening test:     >=6.5%   Diagnostic of Diabetes Mellitus            (if abnormal result is confirmed)   5.7-6.4%   Increased risk of developing Diabetes Mellitus   References:Diagnosis and Classification of Diabetes Mellitus,Diabetes SVXB,9390,30(SPQZR 1):S62-S69 and Standards of Medical Care in         Diabetes - 2011,Diabetes AQTM,2263,33 (Suppl 1):S11-S61.       CBG: Recent Labs  Lab 04/06/2020 0223  GLUCAP 126*       CRITICAL CARE Performed by: Audria Nine   Critical care time: The patient is critically ill with multiple organ systems failure and requires high complexity decision making for assessment and support, frequent evaluation and titration of therapies, application of advanced monitoring technologies and extensive interpretation of multiple databases.  Critical care time 33 mins. This represents my time independent of the NPs time taking care of the pt. This is excluding procedures.    Audria Nine DO [facility] 04/13/2020, 8:11 AM

## 2020-04-13 NOTE — Progress Notes (Addendum)
Neurosurgery Service Progress Note  Subjective: No acute events overnight.   Objective: Vitals:   04/13/20 1300 04/13/20 1400 04/13/20 1500 04/13/20 1600  BP: 102/63 120/70 117/70   Pulse: 72 77 75   Resp: 16 16 16   Temp:    98.5 F (36.9 C)  TempSrc:    Axillary  SpO2: 99% 100% 99%   Weight:      Height:       Temp (24hrs), Avg:98.8 F (37.1 C), Min:98.1 F (36.7 C), Max:99.4 F (37.4 C)  CBC Latest Ref Rng & Units 04/09/2020 04/09/2020 04/12/2020  WBC 4.0 - 10.5 K/uL 10.2 - -  Hemoglobin 12.0 - 15.0 g/dL 10.7(L) 10.2(L) 10.2(L)  Hematocrit 36 - 46 % 32.0(L) 30.0(L) 30.0(L)  Platelets 150 - 400 K/uL 164 - -   BMP Latest Ref Rng & Units 04/10/2020 04/09/2020 04/09/2020  Glucose 70 - 99 mg/dL 111(H) 112(H) -  BUN 8 - 23 mg/dL 17 10 -  Creatinine 0.44 - 1.00 mg/dL 0.94 0.74 -  BUN/Creat Ratio 12 - 28 - - -  Sodium 135 - 145 mmol/L 134(L) 136 137  Potassium 3.5 - 5.1 mmol/L 3.8 3.4(L) 3.2(L)  Chloride 98 - 111 mmol/L 104 104 -  CO2 22 - 32 mmol/L 20(L) 23 -  Calcium 8.9 - 10.3 mg/dL 8.4(L) 8.5(L) -    Intake/Output Summary (Last 24 hours) at 04/13/2020 1749 Last data filed at 04/13/2020 0700 Gross per 24 hour  Intake 780 ml  Output --  Net 780 ml    Current Facility-Administered Medications:    acetaminophen (TYLENOL) tablet 650 mg, 650 mg, Per Tube, Q4H PRN **OR** acetaminophen (TYLENOL) suppository 650 mg, 650 mg, Rectal, Q4H PRN, Ostergard, Thomas A, MD   amiodarone (PACERONE) tablet 200 mg, 200 mg, Per Tube, Daily, Ostergard, Thomas A, MD, 200 mg at 04/13/20 1027   atorvastatin (LIPITOR) tablet 20 mg, 20 mg, Per Tube, q1800, Ostergard, Thomas A, MD, 20 mg at 04/12/20 2021   chlorhexidine gluconate (MEDLINE KIT) (PERIDEX) 0.12 % solution 15 mL, 15 mL, Mouth Rinse, BID, Ostergard, Thomas A, MD, 15 mL at 04/13/20 0843   Chlorhexidine Gluconate Cloth 2 % PADS 6 each, 6 each, Topical, Daily, Ostergard, Thomas A, MD, 6 each at 04/13/20 1027   diltiazem (CARDIZEM)  tablet 30 mg, 30 mg, Oral, BID PRN, Ostergard, Thomas A, MD   docusate (COLACE) 50 MG/5ML liquid 100 mg, 100 mg, Per Tube, BID, Ostergard, Thomas A, MD, 100 mg at 04/13/20 1027   feeding supplement (VITAL AF 1.2 CAL) liquid 1,000 mL, 1,000 mL, Per Tube, Continuous, Gleason, Laura R, PA-C, Last Rate: 40 mL/hr at 04/13/20 0621, 1,000 mL at 04/13/20 0621   heparin injection 5,000 Units, 5,000 Units, Subcutaneous, Q8H, Ostergard, Thomas A, MD, 5,000 Units at 04/13/20 1438   HYDROcodone-acetaminophen (NORCO/VICODIN) 5-325 MG per tablet 1 tablet, 1 tablet, Per Tube, Q4H PRN, Ostergard, Thomas A, MD   HYDROmorphone (DILAUDID) injection 0.5 mg, 0.5 mg, Intravenous, Q3H PRN, Ostergard, Thomas A, MD   labetalol (NORMODYNE) injection 10-40 mg, 10-40 mg, Intravenous, Q10 min PRN, Ostergard, Thomas A, MD, 20 mg at 04/09/20 2238   MEDLINE mouth rinse, 15 mL, Mouth Rinse, 10 times per day, Ostergard, Thomas A, MD, 15 mL at 04/13/20 1400   ondansetron (ZOFRAN) tablet 4 mg, 4 mg, Per Tube, Q4H PRN **OR** ondansetron (ZOFRAN) injection 4 mg, 4 mg, Intravenous, Q4H PRN, Ostergard, Thomas A, MD   polyethylene glycol (MIRALAX / GLYCOLAX) packet 17 g, 17 g, Per Tube,   Daily PRN, Ostergard, Thomas A, MD   promethazine (PHENERGAN) tablet 12.5-25 mg, 12.5-25 mg, Oral, Q4H PRN, Ostergard, Thomas A, MD   propofol (DIPRIVAN) 1000 MG/100ML infusion, 5-80 mcg/kg/min, Intravenous, Continuous, Wickline, Donald, MD, Stopped at 04/01/2020 1700   Physical Exam: Intubation, no sedation, eyes closed to voice, follow commands RLE and RUE,  Flaccid L side and increased tone L Incision c/d/i  Assessment & Plan: 86 y.o. female w/ large SDH and herniation with blown pupil on arrival s/p L craniotomy for evacuation.   -CCM got in contact with son today and he has received his passport. He will try to come in the next couple of days. He would like to see her before she dies, if possible.  Will likely to withdraw care after his  arrival    Kim Hoang, NP 04/13/20 5:49 PM 

## 2020-04-13 NOTE — Progress Notes (Signed)
Physical Therapy Treatment Patient Details Name: Cynthia Vargas MRN: [id number] DOB: 25-Dec-1931 Today's Date: 04/13/2020    History of Present Illness 86 year old admitted to [facility] 10/9 with altered mental status and a very large left subdural hematoma, s/p L crani 10/9. PMH includes HLD, CAD, pericarditis, GERD, ORIF R hip.    PT Comments    Pt lethargic and demonstrating very little command following this day. PT assisted pt through UE/LE PROM exercises to maintain ROM and provide proprioceptive input. PT also repositioned pt, as pt tends towards L lateral leaning with cervical flexion/L rotation. Repositioning produced coughing spell lasting x2 minutes, RRmax 40 breaths/min during. Will continue to follow acutely.      Follow Up Recommendations  SNF;Supervision/Assistance - 24 hour     Equipment Recommendations  None recommended by PT    Recommendations for Other Services       Precautions / Restrictions Precautions Precautions: Fall Precaution Comments: vent -40%/PEEP 5 Restrictions Weight Bearing Restrictions: No    Mobility  Bed Mobility Overal bed mobility: Needs Assistance             General bed mobility comments: Heavy L lateral leaning with head in L cervical flexion and rotation, requiring total assist for positioning. Coughing bout x2 minutes after repositioning pt head in midline  Transfers                 General transfer comment: unable  Ambulation/Gait             General Gait Details: unable   Stairs             Wheelchair Mobility    Modified Rankin (Stroke Patients Only) Modified Rankin (Stroke Patients Only) Pre-Morbid Rankin Score: No symptoms Modified Rankin: Severe disability     Balance Overall balance assessment: Needs assistance                                          Cognition Arousal/Alertness: Lethargic Behavior During Therapy: Flat affect Overall Cognitive Status: Difficult to  assess                                 General Comments: pt follows x1 command, "bend your arm" today. Otherwise pt lethargic and not following commands today. Pt raises eyebrows when PT cues pt to open eyes.      Exercises General Exercises - Lower Extremity Ankle Circles/Pumps: PROM;Both;Supine;10 reps (sustained 10 second hold at end range DF (lacking 5* DF)) Heel Slides: PROM;Both;10 reps;Supine Hip ABduction/ADduction: PROM;Both;10 reps;Supine Shoulder Exercises Elbow Flexion: PROM;Left;10 reps;Right;5 reps;Supine Elbow Extension: PROM;Left;5 reps;Right;10 reps;Supine    General Comments        Pertinent Vitals/Pain Pain Assessment: Faces Faces Pain Scale: Hurts a little bit Pain Location: generalized (during coughing) Pain Descriptors / Indicators: Discomfort;Grimacing Pain Intervention(s): Limited activity within patient's tolerance;Monitored during session    Home Living                      Prior Function            PT Goals (current goals can now be found in the care plan section) Acute Rehab PT Goals Patient Stated Goal: unable to participate PT Goal Formulation: Patient unable to participate in goal setting Time For Goal Achievement: 04/25/20 Potential to Achieve Goals: Poor Progress  towards PT goals: Progressing toward goals    Frequency    Min 3X/week      PT Plan Current plan remains appropriate    Co-evaluation              AM-PAC PT "6 Clicks" Mobility   Outcome Measure  Help needed turning from your back to your side while in a flat bed without using bedrails?: Total Help needed moving from lying on your back to sitting on the side of a flat bed without using bedrails?: Total Help needed moving to and from a bed to a chair (including a wheelchair)?: Total Help needed standing up from a chair using your arms (e.g., wheelchair or bedside chair)?: Total Help needed to walk in hospital room?: Total Help needed  climbing 3-5 steps with a railing? : Total 6 Click Score: 6    End of Session   Activity Tolerance: Patient limited by lethargy Patient left: in bed;with call bell/phone within reach;with bed alarm set;with SCD's reapplied Nurse Communication: Mobility status PT Visit Diagnosis: Hemiplegia and hemiparesis;Muscle weakness (generalized) (M62.81) Hemiplegia - Right/Left: Right Hemiplegia - dominant/non-dominant: Dominant Hemiplegia - caused by: Nontraumatic intracerebral hemorrhage     Time: 1210-1234 PT Time Calculation (min) (ACUTE ONLY): 24 min  Charges:  $Therapeutic Exercise: 8-22 mins $Therapeutic Activity: 8-22 mins                     Dinnis Rog E, PT Acute Rehabilitation Services Pager [phone]  Office [phone]    Wilba Mutz D Byan Poplaski 04/13/2020, 1:13 PM

## 2020-04-14 DIAGNOSIS — Z9911 Dependence on respirator [ventilator] status: Secondary | ICD-10-CM | POA: Diagnosis not present

## 2020-04-14 DIAGNOSIS — R0689 Other abnormalities of breathing: Secondary | ICD-10-CM | POA: Diagnosis not present

## 2020-04-14 NOTE — Progress Notes (Signed)
OT Cancellation Note  Patient Details Name: Cynthia Vargas MRN: [id number] DOB: 12-20-1931   Cancelled Treatment:    Reason Eval/Treat Not Completed: Medical issues which prohibited therapy.  Per RN, plan is for pt to transition to comfort care once son arrives.  OT will monitor from a distance at this time pending transition to comfort.  Nilsa Nutting., OTR/L Acute Rehabilitation Services Pager [phone] Office [phone]   Lucille Passy M 04/14/2020, 8:48 AM

## 2020-04-14 NOTE — Progress Notes (Signed)
NAME:  Cynthia Vargas, MRN:  [id number], DOB:  [birth year], LOS: 6 ADMISSION DATE:  04/13/2020, CONSULTATION DATE:  04/03/2020 REFERRING MD: Zada Finders , CHIEF COMPLAINT: Altered mental status   Brief History   86 year old normally on Coumadin for paroxysmal atrial fibrillation who presented with altered mental status and a very large left subdural hematoma  History of present illness   Patient apparently complained of a headache and then became unresponsive.  She was brought to the department of emergency medicine she was posturing.  She was intubated for decreased mental status and a CT scan of the head obtained which showed a large left subdural hematoma with substantial midline shift.  She was subsequently taken for a craniotomy after reversal of her Coumadin and arrives in the intensive care unit intubated sedated and mechanically ventilated.  We can obtain no history from the patient.  Past Medical History  Chart indicates a history of hyperlipidemia, coronary artery disease, distant history of pericarditis, history of hyperlipidemia, and a history of GERD. Surgical history is remarkable for cholecystectomy ORIF of the right hip, tonsillectomy, and lung mass biopsy.  Significant Hospital Events   This is postoperative day #1.  She has had no significant subjective change overnight, she has not received any sedation overnight.  Consults:    Procedures:  Craniotomy on 10/9  Significant Diagnostic Tests:    Micro Data:    Antimicrobials:   ancef 10/8-  Interim history/subjective:   No clinical change reported Not on any sedation   Objective   Blood pressure 100/61, pulse 72, temperature 99.1 F (37.3 C), temperature source Oral, resp. rate 16, height 5\' 2"  (1.575 m), weight 49 kg, SpO2 98 %.    Vent Mode: PRVC FiO2 (%):  [30 %-40 %] 30 % Set Rate:  [16 bmp] 16 bmp Vt Set:  [400 mL] 400 mL PEEP:  [5 cmH20] 5 cmH20 Plateau Pressure:  [15 cmH20-16 cmH20] 16 cmH20    Intake/Output Summary (Last 24 hours) at 04/14/2020 1015 Last data filed at 04/14/2020 1000 Gross per 24 hour  Intake 1080 ml  Output 850 ml  Net 230 ml   Filed Weights   04/11/2020 0300  Weight: 49 kg   General: Elderly ill-appearing woman, intubated HEENT: ET tube in place, oropharynx otherwise clear, craniotomy site CDI Neuro: Not on any sedation.  Did not open eyes to voice, has intermittently follow commands for staff on the right CV: Regular, no murmur PULM: Clear bilaterally GI: Soft, nondistended, positive bowel sounds Extremities: No edema Skin: No rash    Resolved Hospital Problem list     Assessment & Plan:  This is an 86 year old normally on Coumadin for history of paroxysmal atrial fibrillation who presented with altered mental status and the finding of a very large left subdural hematoma.  She is status post craniotomy for evacuation.  Post procedure CT scan shows resolution of the midline shift with substantial pneumocephalus.  Her Coumadin was reversed with [place] and vitamin K.    Large subdural herniation s/p craniotomy with secondary respiratory failure Intubated and post-surgical mental status precluded extubation. Subdural drain removed 10/13 P: Poor prognosis for neurological recovery per neurosurgery evaluation even after SDH evacuation. Patient's son is her Marine scientist and is working to travel to [place] to see her, probably transition to comfort care at that time Okay to pursue PSV for comfort but she does not have the mental status for airway protection to to attempt extubation. Continue bronchial hygiene VAP prevention orders Agree with DNR  status should she acutely change, arrest Minimize labs, invasive interventions     Best practice:  Diet: start TF while clarifying GOC Pain/Anxiety/Delirium protocol (if indicated):  VAP protocol (if indicated): In place DVT prophylaxis:  GI prophylaxis: Pepcid has been ordered Glucose control: not  indicated Mobility: Bedrest Code Status: DNR Family Communication: Son is working to come to [facility] to see her Disposition: ICU  Labs   CBC: Recent Labs  Lab 04/30/2020 0226 04/21/2020 0226 04/11/2020 0331 04/22/2020 0902 03/31/2020 1642 04/09/20 0749 04/09/20 0751  WBC 10.4  --   --  7.5  --   --  10.2  NEUTROABS 8.6*  --   --   --   --   --  8.6*  HGB 13.1   < > 13.9 10.2* 10.2* 10.2* 10.7*  HCT 40.0   < > 41.0 30.6* 30.0* 30.0* 32.0*  MCV 92.2  --   --  93.0  --   --  93.8  PLT 191  --   --  153  --   --  164   < > = values in this interval not displayed.    Basic Metabolic Panel: Recent Labs  Lab 04/17/2020 0226 04/15/2020 0226 04/14/2020 0331 04/30/2020 1642 04/09/20 0749 04/09/20 0751 04/10/20 0555  NA 136   < > 136 138 137 136 134*  K 3.6   < > 3.1* 3.6 3.2* 3.4* 3.8  CL 99  --   --   --   --  104 104  CO2 23  --   --   --   --  23 20*  GLUCOSE 135*  --   --   --   --  112* 111*  BUN 16  --   --   --   --  10 17  CREATININE 0.96  --   --   --   --  0.74 0.94  CALCIUM 9.1  --   --   --   --  8.5* 8.4*   < > = values in this interval not displayed.   GFR: Estimated Creatinine Clearance: 33.2 mL/min (by C-G formula based on SCr of 0.94 mg/dL). Recent Labs  Lab 04/02/2020 0226 04/05/2020 0902 04/09/20 0751  WBC 10.4 7.5 10.2    Liver Function Tests: Recent Labs  Lab 04/29/2020 0226  AST 51*  ALT 37  ALKPHOS 46  BILITOT 0.6  PROT 7.0  ALBUMIN 4.3   No results for input(s): LIPASE, AMYLASE in the last 168 hours. Recent Labs  Lab 04/17/2020 0226  AMMONIA 20    ABG    Component Value Date/Time   PHART 7.466 (H) 04/09/2020 0749   PCO2ART 35.1 04/09/2020 0749   PO2ART 229 (H) 04/09/2020 0749   HCO3 25.3 04/09/2020 0749   TCO2 26 04/09/2020 0749   O2SAT 100.0 04/09/2020 0749     Coagulation Profile: Recent Labs  Lab 04/03/2020 1620 04/09/20 0231 04/09/20 0547 04/10/20 0555 04/11/20 0426  INR 1.2 1.0 1.0 1.1 1.0    Cardiac Enzymes: No results for  input(s): CKTOTAL, CKMB, CKMBINDEX, TROPONINI in the last 168 hours.  HbA1C: Hgb A1c MFr Bld  Date/Time Value Ref Range Status  09/18/2015 10:39 AM 5.7 (H) <5.7 % Final    Comment:  According to the ADA Clinical Practice Recommendations for 2011, when HbA1c is used as a screening test:     >=6.5%   Diagnostic of Diabetes Mellitus            (if abnormal result is confirmed)   5.7-6.4%   Increased risk of developing Diabetes Mellitus   References:Diagnosis and Classification of Diabetes Mellitus,Diabetes MMCR,7543,60(OVPCH 1):S62-S69 and Standards of Medical Care in         Diabetes - 2011,Diabetes EKBT,2481,85 (Suppl 1):S11-S61.       CBG: Recent Labs  Lab 04/01/2020 0223  GLUCAP 126*       CRITICAL CARE  Critical care time: N/A     Baltazar Apo, MD, PhD 04/14/2020, 10:23 AM [place] [phone] or if no answer [phone]

## 2020-04-14 NOTE — Progress Notes (Signed)
Neurosurgery Service Progress Note  Subjective: No acute events overnight.   Objective: Vitals:   04/14/20 0600 04/14/20 0700 04/14/20 0759 04/14/20 0800  BP: 116/68 125/68    Pulse: 76 78 81   Resp: 16 17 16    Temp:    99.1 F (37.3 C)  TempSrc:    Oral  SpO2: 99% 99% 98%   Weight:      Height:       Temp (24hrs), Avg:99.2 F (37.3 C), Min:98.5 F (36.9 C), Max:100.2 F (37.9 C)  CBC Latest Ref Rng & Units 04/09/2020 04/09/2020 04/27/2020  WBC 4.0 - 10.5 K/uL 10.2 - -  Hemoglobin 12.0 - 15.0 g/dL 10.7(L) 10.2(L) 10.2(L)  Hematocrit 36 - 46 % 32.0(L) 30.0(L) 30.0(L)  Platelets 150 - 400 K/uL 164 - -   BMP Latest Ref Rng & Units 04/10/2020 04/09/2020 04/09/2020  Glucose 70 - 99 mg/dL 111(H) 112(H) -  BUN 8 - 23 mg/dL 17 10 -  Creatinine 0.44 - 1.00 mg/dL 0.94 0.74 -  BUN/Creat Ratio 12 - 28 - - -  Sodium 135 - 145 mmol/L 134(L) 136 137  Potassium 3.5 - 5.1 mmol/L 3.8 3.4(L) 3.2(L)  Chloride 98 - 111 mmol/L 104 104 -  CO2 22 - 32 mmol/L 20(L) 23 -  Calcium 8.9 - 10.3 mg/dL 8.4(L) 8.5(L) -    Intake/Output Summary (Last 24 hours) at 04/14/2020 0901 Last data filed at 04/14/2020 0700 Gross per 24 hour  Intake 960 ml  Output 650 ml  Net 310 ml    Current Facility-Administered Medications:  .  acetaminophen (TYLENOL) tablet 650 mg, 650 mg, Per Tube, Q4H PRN **OR** acetaminophen (TYLENOL) suppository 650 mg, 650 mg, Rectal, Q4H PRN, Judith Part, MD .  amiodarone (PACERONE) tablet 200 mg, 200 mg, Per Tube, Daily, Judith Part, MD, 200 mg at 04/13/20 1027 .  atorvastatin (LIPITOR) tablet 20 mg, 20 mg, Per Tube, q1800, Judith Part, MD, 20 mg at 04/12/20 2021 .  chlorhexidine gluconate (MEDLINE KIT) (PERIDEX) 0.12 % solution 15 mL, 15 mL, Mouth Rinse, BID, Tauheedah Bok, Joyice Faster, MD, 15 mL at 04/14/20 0859 .  Chlorhexidine Gluconate Cloth 2 % PADS 6 each, 6 each, Topical, Daily, Judith Part, MD, 6 each at 04/13/20 1027 .  diltiazem (CARDIZEM)  tablet 30 mg, 30 mg, Oral, BID PRN, Judith Part, MD .  docusate (COLACE) 50 MG/5ML liquid 100 mg, 100 mg, Per Tube, BID, Judith Part, MD, 100 mg at 04/13/20 2324 .  feeding supplement (VITAL AF 1.2 CAL) liquid 1,000 mL, 1,000 mL, Per Tube, Continuous, Gleason, Otilio Carpen, PA-C, Last Rate: 40 mL/hr at 04/13/20 0621, 1,000 mL at 04/13/20 0621 .  heparin injection 5,000 Units, 5,000 Units, Subcutaneous, Q8H, Judith Part, MD, 5,000 Units at 04/14/20 [phone] .  HYDROcodone-acetaminophen (NORCO/VICODIN) 5-325 MG per tablet 1 tablet, 1 tablet, Per Tube, Q4H PRN, Judith Part, MD .  HYDROmorphone (DILAUDID) injection 0.5 mg, 0.5 mg, Intravenous, Q3H PRN, Judith Part, MD .  labetalol (NORMODYNE) injection 10-40 mg, 10-40 mg, Intravenous, Q10 min PRN, Judith Part, MD, 20 mg at 04/09/20 2238 .  MEDLINE mouth rinse, 15 mL, Mouth Rinse, 10 times per day, Judith Part, MD, 15 mL at 04/14/20 0600 .  ondansetron (ZOFRAN) tablet 4 mg, 4 mg, Per Tube, Q4H PRN **OR** ondansetron (ZOFRAN) injection 4 mg, 4 mg, Intravenous, Q4H PRN, Jarvis Sawa A, MD .  polyethylene glycol (MIRALAX / GLYCOLAX) packet 17 g, 17 g, Per  Tube, Daily PRN, Judith Part, MD .  promethazine (PHENERGAN) tablet 12.5-25 mg, 12.5-25 mg, Oral, Q4H PRN, Judith Part, MD .  propofol (DIPRIVAN) 1000 MG/100ML infusion, 5-80 mcg/kg/min, Intravenous, Continuous, Ripley Fraise, MD, Stopped at 04/05/2020 1700   Physical Exam: Intubation, no sedation, eyes closed to voice but raises eyebrows, follow commands in RLE and RUE,  Flaccid L side and increased tone Incision c/d/i  Assessment & Plan: 86 y.o. female w/ large SDH and herniation with blown pupil on arrival s/p L craniotomy for evacuation. Post-op CTH with good evacuation, 10/13 SDD d/c'd  -CCM recs, pt is DNR, awaiting son's arrival for extubation  Judith Part, MD 04/14/20 9:01 AM

## 2020-04-15 DIAGNOSIS — R0689 Other abnormalities of breathing: Secondary | ICD-10-CM | POA: Diagnosis not present

## 2020-04-15 DIAGNOSIS — S065X9A Traumatic subdural hemorrhage with loss of consciousness of unspecified duration, initial encounter: Secondary | ICD-10-CM

## 2020-04-15 DIAGNOSIS — L8990 Pressure ulcer of unspecified site, unspecified stage: Secondary | ICD-10-CM | POA: Insufficient documentation

## 2020-04-15 NOTE — Progress Notes (Signed)
NAME:  Cynthia Vargas, MRN:  [id number], DOB:  May 02, 1932, LOS: 7 ADMISSION DATE:  04/11/2020, CONSULTATION DATE:  04/06/2020 REFERRING MD: Zada Finders , CHIEF COMPLAINT: Altered mental status   Brief History   86 year old normally on Coumadin for paroxysmal atrial fibrillation who presented with altered mental status and a very large left subdural hematoma  History of present illness   Patient apparently complained of a headache and then became unresponsive.  She was brought to the department of emergency medicine she was posturing.  She was intubated for decreased mental status and a CT scan of the head obtained which showed a large left subdural hematoma with substantial midline shift.  She was subsequently taken for a craniotomy after reversal of her Coumadin and arrives in the intensive care unit intubated sedated and mechanically ventilated.  We can obtain no history from the patient.  Past Medical History  Chart indicates a history of hyperlipidemia, coronary artery disease, distant history of pericarditis, history of hyperlipidemia, and a history of GERD. Surgical history is remarkable for cholecystectomy ORIF of the right hip, tonsillectomy, and lung mass biopsy.  Significant Hospital Events   This is postoperative day #1.  She has had no significant subjective change overnight, she has not received any sedation overnight.  Consults:    Procedures:  Craniotomy on 10/9  Significant Diagnostic Tests:    Micro Data:    Antimicrobials:   ancef 10/8-  Interim history/subjective:   No changes reported   Objective   Blood pressure (!) 110/59, pulse 69, temperature 99 F (37.2 C), temperature source Axillary, resp. rate 16, height 5\' 2"  (1.575 m), weight 49 kg, SpO2 98 %.    Vent Mode: PRVC FiO2 (%):  [30 %] 30 % Set Rate:  [16 bmp] 16 bmp Vt Set:  [40 mL-7400 mL] 40 mL PEEP:  [5 cmH20] 5 cmH20 Plateau Pressure:  [14 cmH20-17 cmH20] 17 cmH20   Intake/Output Summary  (Last 24 hours) at 04/15/2020 1122 Last data filed at 04/15/2020 1000 Gross per 24 hour  Intake 986 ml  Output 400 ml  Net 586 ml   Filed Weights   04/09/2020 0300  Weight: 49 kg   General: Elderly ill-appearing woman, intubated HEENT: ET tube in place, oropharynx otherwise clear, craniotomy site CDI Neuro: Not on any sedation.  Opens eyes to voice, better on the right, was able to move her right upper extremity on command CV: Regular, no murmur PULM: Clear bilaterally GI: Soft, nondistended, positive bowel sounds Extremities: No edema Skin: No rash    Resolved Hospital Problem list     Assessment & Plan:  This is an 86 year old normally on Coumadin for history of paroxysmal atrial fibrillation who presented with altered mental status and the finding of a very large left subdural hematoma.  She is status post craniotomy for evacuation.  Post procedure CT scan shows resolution of the midline shift with substantial pneumocephalus.  Her Coumadin was reversed with [place] and vitamin K.    Large subdural herniation s/p craniotomy with secondary respiratory failure Intubated and post-surgical mental status precluded extubation. Subdural drain removed 10/13 P: Neurosurgery managing.  Poor prognosis noted even after SDH evacuation. Patient's son is trying to get [facility], anticipate probable transition to comfort care at that time [facility] for PSV principally for comfort.  She did have the mental status or strength to attempt extubation.  If there was a decision to push forward with aggressive care she would require tracheostomy. Continue bronchial hygiene VAP prevention orders  Agree with DNR status that she acutely arrest Minimize labs and invasive interventions.     Best practice:  Diet: start TF while clarifying GOC Pain/Anxiety/Delirium protocol (if indicated):  VAP protocol (if indicated): In place DVT prophylaxis:  GI prophylaxis: Pepcid has been ordered Glucose control: not  indicated Mobility: Bedrest Code Status: DNR Family Communication: Son is working to come to [facility] to see her Disposition: ICU  Labs   CBC: Recent Labs  Lab 04/14/2020 1642 04/09/20 0749 04/09/20 0751  WBC  --   --  10.2  NEUTROABS  --   --  8.6*  HGB 10.2* 10.2* 10.7*  HCT 30.0* 30.0* 32.0*  MCV  --   --  93.8  PLT  --   --  998    Basic Metabolic Panel: Recent Labs  Lab 04/09/2020 1642 04/09/20 0749 04/09/20 0751 04/10/20 0555  NA 138 137 136 134*  K 3.6 3.2* 3.4* 3.8  CL  --   --  104 104  CO2  --   --  23 20*  GLUCOSE  --   --  112* 111*  BUN  --   --  10 17  CREATININE  --   --  0.74 0.94  CALCIUM  --   --  8.5* 8.4*   GFR: Estimated Creatinine Clearance: 33.2 mL/min (by C-G formula based on SCr of 0.94 mg/dL). Recent Labs  Lab 04/09/20 0751  WBC 10.2    Liver Function Tests: No results for input(s): AST, ALT, ALKPHOS, BILITOT, PROT, ALBUMIN in the last 168 hours. No results for input(s): LIPASE, AMYLASE in the last 168 hours. No results for input(s): AMMONIA in the last 168 hours.  ABG    Component Value Date/Time   PHART 7.466 (H) 04/09/2020 0749   PCO2ART 35.1 04/09/2020 0749   PO2ART 229 (H) 04/09/2020 0749   HCO3 25.3 04/09/2020 0749   TCO2 26 04/09/2020 0749   O2SAT 100.0 04/09/2020 0749     Coagulation Profile: Recent Labs  Lab 04/01/2020 1620 04/09/20 0231 04/09/20 0547 04/10/20 0555 04/11/20 0426  INR 1.2 1.0 1.0 1.1 1.0    Cardiac Enzymes: No results for input(s): CKTOTAL, CKMB, CKMBINDEX, TROPONINI in the last 168 hours.  HbA1C: Hgb A1c MFr Bld  Date/Time Value Ref Range Status  09/18/2015 10:39 AM 5.7 (H) <5.7 % Final    Comment:                                                                           According to the ADA Clinical Practice Recommendations for 2011, when HbA1c is used as a screening test:     >=6.5%   Diagnostic of Diabetes Mellitus            (if abnormal result is confirmed)   5.7-6.4%    Increased risk of developing Diabetes Mellitus   References:Diagnosis and Classification of Diabetes Mellitus,Diabetes PJAS,5053,97(QBHAL 1):S62-S69 and Standards of Medical Care in         Diabetes - 2011,Diabetes PFXT,0240,97 (Suppl 1):S11-S61.       CBG: No results for input(s): GLUCAP in the last 168 hours.     CRITICAL CARE  Critical care time: N/A     Baltazar Apo, MD,  PhD 04/15/2020, 11:22 AM [place] [facility] [phone] or if no answer [phone]

## 2020-04-15 NOTE — Progress Notes (Signed)
   Providing Compassionate, Quality Care - Together  NEUROSURGERY PROGRESS NOTE   S: No issues overnight.   O: EXAM:  BP 112/66 (BP Location: Left Arm)   Pulse 74   Temp 98.8 F (37.1 C) (Oral)   Resp 17   Ht 5\' 2"  (1.575 m)   Wt 49 kg   SpO2 99%   BMI 19.76 kg/m   Intubated Eyes open to voice Incision c/d/i FC on right Flaccid on left  ASSESSMENT:  86 y.o. female with  w/ large SDH and herniation with blown pupil on arrival s/p L craniotomy for evacuation.Post-op CTH with good evacuation, 10/13 SDD d/c'd  PLAN: - stable exam - cont support while son in coming in    Thank you for allowing me to participate in this patient's care.  Please do not hesitate to call with questions or concerns.   Elwin Sleight, [facility] Cell: [phone]

## 2020-04-16 DIAGNOSIS — R0689 Other abnormalities of breathing: Secondary | ICD-10-CM | POA: Diagnosis not present

## 2020-04-16 NOTE — Progress Notes (Signed)
NAME:  Cynthia Vargas, MRN:  [id number], DOB:  17-Dec-1931, LOS: 51 ADMISSION DATE:  04/03/2020, CONSULTATION DATE:  04/11/2020 REFERRING MD: Zada Finders , CHIEF COMPLAINT: Altered mental status   Brief History   86 year old normally on Coumadin for paroxysmal atrial fibrillation who presented with altered mental status and a very large left subdural hematoma  History of present illness   Patient apparently complained of a headache and then became unresponsive.  She was brought to the department of emergency medicine she was posturing.  She was intubated for decreased mental status and a CT scan of the head obtained which showed a large left subdural hematoma with substantial midline shift.  She was subsequently taken for a craniotomy after reversal of her Coumadin and arrives in the intensive care unit intubated sedated and mechanically ventilated.  We can obtain no history from the patient.  Past Medical History  Chart indicates a history of hyperlipidemia, coronary artery disease, distant history of pericarditis, history of hyperlipidemia, and a history of GERD. Surgical history is remarkable for cholecystectomy ORIF of the right hip, tonsillectomy, and lung mass biopsy.  Significant Hospital Events   This is postoperative day #1.  She has had no significant subjective change overnight, she has not received any sedation overnight.  Consults:    Procedures:  Craniotomy on 10/9  Significant Diagnostic Tests:    Micro Data:    Antimicrobials:   ancef 10/8-  Interim history/subjective:   No issues or changes reported No word on when her son will be able to get visa to come to [place]   Objective   Blood pressure 116/61, pulse 73, temperature 99.4 F (37.4 C), temperature source Axillary, resp. rate 16, height 5\' 2"  (1.575 m), weight 49 kg, SpO2 98 %.    Vent Mode: PRVC FiO2 (%):  [30 %] 30 % Set Rate:  [16 bmp] 16 bmp Vt Set:  [400 mL] 400 mL PEEP:  [5 cmH20] 5 cmH20 Plateau  Pressure:  [14 cmH20-15 cmH20] 14 cmH20   Intake/Output Summary (Last 24 hours) at 04/16/2020 0940 Last data filed at 04/16/2020 0800 Gross per 24 hour  Intake 1010 ml  Output 550 ml  Net 460 ml   Filed Weights   04/24/2020 0300  Weight: 49 kg   General: Ill-appearing elderly woman, ventilated HEENT: ET tube in position, OP clear, craniotomy site clean, some dried blood Neuro: Opens eyes to voice, able to follow some commands in the right upper extremity, right lower extremity, left side is not moving CV: Regular, no murmur PULM: Clear bilaterally GI: Nondistended, positive bowel sounds Extremities: Cool, no edema Skin: No rash    Resolved Hospital Problem list     Assessment & Plan:  This is an 86 year old normally on Coumadin for history of paroxysmal atrial fibrillation who presented with altered mental status and the finding of a very large left subdural hematoma.  She is status post craniotomy for evacuation.  Post procedure CT scan shows resolution of the midline shift with substantial pneumocephalus.  Her Coumadin was reversed with [place] and vitamin K.    Large subdural herniation s/p craniotomy with secondary respiratory failure Intubated and post-surgical mental status precluded extubation. Subdural drain removed 10/13 P: Unfortunately poor prognosis for meaningful recovery even after SDH surgical treatment.  Likely will be permanently debilitated, institutionalized, tracheostomy dependent Patient's son is trying to get to [place] from [place], anticipate transition to comfort care at that time.  Unclear how much progress has been made with this plan.  Okay for PSV, tolerates well.  Does not have the mental status or strength to attempt extubation.  If we are to push forward aggressively suspect she needs tracheostomy Bronchial hygiene VAP prevention orders DNR status should she acutely change Have minimize labs, invasive interventions.  Will check labs on 10/18 to  ensure no evolving derangements     Best practice:  Diet: start TF while clarifying GOC Pain/Anxiety/Delirium protocol (if indicated):  VAP protocol (if indicated): In place DVT prophylaxis:  GI prophylaxis: Pepcid has been ordered Glucose control: not indicated Mobility: Bedrest Code Status: DNR Family Communication: Son is working to come to [employer] to see her Disposition: ICU  Labs   CBC: No results for input(s): WBC, NEUTROABS, HGB, HCT, MCV, PLT in the last 168 hours.  Basic Metabolic Panel: Recent Labs  Lab 04/10/20 0555  NA 134*  K 3.8  CL 104  CO2 20*  GLUCOSE 111*  BUN 17  CREATININE 0.94  CALCIUM 8.4*   GFR: Estimated Creatinine Clearance: 33.2 mL/min (by C-G formula based on SCr of 0.94 mg/dL). No results for input(s): PROCALCITON, WBC, LATICACIDVEN in the last 168 hours.  Liver Function Tests: No results for input(s): AST, ALT, ALKPHOS, BILITOT, PROT, ALBUMIN in the last 168 hours. No results for input(s): LIPASE, AMYLASE in the last 168 hours. No results for input(s): AMMONIA in the last 168 hours.  ABG    Component Value Date/Time   PHART 7.466 (H) 04/09/2020 0749   PCO2ART 35.1 04/09/2020 0749   PO2ART 229 (H) 04/09/2020 0749   HCO3 25.3 04/09/2020 0749   TCO2 26 04/09/2020 0749   O2SAT 100.0 04/09/2020 0749     Coagulation Profile: Recent Labs  Lab 04/10/20 0555 04/11/20 0426  INR 1.1 1.0    Cardiac Enzymes: No results for input(s): CKTOTAL, CKMB, CKMBINDEX, TROPONINI in the last 168 hours.  HbA1C: Hgb A1c MFr Bld  Date/Time Value Ref Range Status  09/18/2015 10:39 AM 5.7 (H) <5.7 % Final    Comment:                                                                           According to the ADA Clinical Practice Recommendations for 2011, when HbA1c is used as a screening test:     >=6.5%   Diagnostic of Diabetes Mellitus            (if abnormal result is confirmed)   5.7-6.4%   Increased risk of developing Diabetes  [place]   References:Diagnosis and Classification of Diabetes Mellitus,Diabetes DJME,2683,41(DQQIW 1):S62-S69 and Standards of Medical Care in         Diabetes - 2011,Diabetes LNLG,9211,94 (Suppl 1):S11-S61.       CBG: No results for input(s): GLUCAP in the last 168 hours.     CRITICAL CARE  Critical care time: N/A     Baltazar Apo, MD, PhD 04/16/2020, 9:40 AM [facility] [phone] or if no answer [phone]

## 2020-04-16 NOTE — Progress Notes (Signed)
   Providing Compassionate, Quality Care - Together  NEUROSURGERY PROGRESS NOTE   S: No issues overnight.   O: EXAM:  BP (!) 110/50   Pulse 88   Temp 99.4 F (37.4 C) (Axillary)   Resp (!) 38   Ht 5\' 2"  (1.575 m)   Wt 49 kg   SpO2 98%   BMI 19.76 kg/m   Intubated Eyes open to voice Incision c/d/i FC on right Flaccid on left  ASSESSMENT:  86 y.o. female with  w/ large SDH and herniation with blown pupil on arrival s/p L craniotomy for evacuation.Post-op CTH with good evacuation, 10/13 SDD d/c'd  PLAN: - stable exam - cont support while son in coming in    Thank you for allowing me to participate in this patient's care.  Please do not hesitate to call with questions or concerns.   Elwin Sleight, [facility] Cell: [phone]

## 2020-04-17 DIAGNOSIS — E440 Moderate protein-calorie malnutrition: Secondary | ICD-10-CM | POA: Diagnosis not present

## 2020-04-17 DIAGNOSIS — R0689 Other abnormalities of breathing: Secondary | ICD-10-CM | POA: Diagnosis not present

## 2020-04-17 DIAGNOSIS — S065X9A Traumatic subdural hemorrhage with loss of consciousness of unspecified duration, initial encounter: Secondary | ICD-10-CM | POA: Diagnosis not present

## 2020-04-17 LAB — BASIC METABOLIC PANEL
Anion gap: 12 (ref 5–15)
BUN: 36 mg/dL — ABNORMAL HIGH (ref 8–23)
CO2: 25 mmol/L (ref 22–32)
Calcium: 8.8 mg/dL — ABNORMAL LOW (ref 8.9–10.3)
Chloride: 111 mmol/L (ref 98–111)
Creatinine, Ser: 0.69 mg/dL (ref 0.44–1.00)
GFR, Estimated: >60 mL/min (ref >60)
Glucose, Bld: 124 mg/dL — ABNORMAL HIGH (ref 70–99)
Potassium: 3.6 mmol/L (ref 3.5–5.1)
Sodium: 148 mmol/L — ABNORMAL HIGH (ref 135–145)

## 2020-04-17 LAB — CBC
HCT: 29.3 % — ABNORMAL LOW (ref 36.0–46.0)
Hemoglobin: 9.2 g/dL — ABNORMAL LOW (ref 12.0–15.0)
MCH: 30.6 pg (ref 26.0–34.0)
MCHC: 31.4 g/dL (ref 30.0–36.0)
MCV: 97.3 fL (ref 80.0–100.0)
Platelets: 373 10*3/uL (ref 150–400)
RBC: 3.01 MIL/uL — ABNORMAL LOW (ref 3.87–5.11)
RDW: 14 % (ref 11.5–15.5)
WBC: 8.5 10*3/uL (ref 4.0–10.5)
nRBC: 0 % (ref 0.0–0.2)

## 2020-04-17 LAB — PHOSPHORUS: Phosphorus: 3.6 mg/dL (ref 2.5–4.6)

## 2020-04-17 LAB — MAGNESIUM: Magnesium: 2.3 mg/dL (ref 1.7–2.4)

## 2020-04-17 MED ORDER — ACETAMINOPHEN 650 MG RE SUPP: 650.0000 mg | Freq: Four times a day (QID) | RECTAL | Status: DC | PRN

## 2020-04-17 MED ORDER — LORAZEPAM 1 MG PO TABS: 1.0000 mg | ORAL_TABLET | ORAL | Status: DC | PRN

## 2020-04-17 MED ORDER — GLYCOPYRROLATE 0.2 MG/ML IJ SOLN
0.2000 mg | INTRAMUSCULAR | Status: DC | PRN
  Administered 2020-04-18 – 2020-04-21 (×4): 0.2 mg via INTRAVENOUS
  Filled 2020-04-17 (×4): qty 1

## 2020-04-17 MED ORDER — LORAZEPAM 2 MG/ML IJ SOLN: 1.0000 mg | INTRAMUSCULAR | Status: DC | PRN

## 2020-04-17 MED ORDER — ONDANSETRON 4 MG PO TBDP: 4.0000 mg | ORAL_TABLET | Freq: Four times a day (QID) | ORAL | Status: DC | PRN

## 2020-04-17 MED ORDER — BIOTENE DRY MOUTH MT LIQD: 15.0000 mL | OROMUCOSAL | Status: DC | PRN

## 2020-04-17 MED ORDER — GLYCOPYRROLATE 0.2 MG/ML IJ SOLN: 0.2000 mg | INTRAMUSCULAR | Status: DC | PRN

## 2020-04-17 MED ORDER — ACETAMINOPHEN 325 MG PO TABS: 650.0000 mg | ORAL_TABLET | Freq: Four times a day (QID) | ORAL | Status: DC | PRN

## 2020-04-17 MED ORDER — GLYCOPYRROLATE 1 MG PO TABS
1.0000 mg | ORAL_TABLET | ORAL | Status: DC | PRN
  Filled 2020-04-17: qty 1

## 2020-04-17 MED ORDER — POLYVINYL ALCOHOL 1.4 % OP SOLN
1.0000 [drp] | Freq: Four times a day (QID) | OPHTHALMIC | Status: DC | PRN
  Administered 2020-04-19 – 2020-04-22 (×2): 1 [drp] via OPHTHALMIC
  Filled 2020-04-17: qty 15

## 2020-04-17 MED ORDER — LORAZEPAM 2 MG/ML PO CONC: 1.0000 mg | ORAL | Status: DC | PRN

## 2020-04-17 MED ORDER — ONDANSETRON HCL 4 MG/2ML IJ SOLN: 4.0000 mg | Freq: Four times a day (QID) | INTRAMUSCULAR | Status: DC | PRN

## 2020-04-17 MED ORDER — MORPHINE SULFATE (PF) 2 MG/ML IV SOLN
1.0000 mg | INTRAVENOUS | Status: DC | PRN
  Administered 2020-04-17 – 2020-04-23 (×8): 1 mg via INTRAVENOUS
  Filled 2020-04-17 (×8): qty 1

## 2020-04-17 NOTE — Progress Notes (Signed)
NAME:  Cynthia Vargas, MRN:  [id number], DOB:  1931-11-08, LOS: 56 ADMISSION DATE:  04/02/2020, CONSULTATION DATE:  10/9 REFERRING MD:  Zada Finders, CHIEF COMPLAINT:  Confusion   Brief History   86 year old normally on Coumadin for paroxysmal atrial fibrillation who presented with altered mental status and a very large left subdural hematoma  Past Medical History  Hyperlipidemia Hypertension CAD Pericarditis GERD ORIF Tonsillectomy   Significant Hospital Events     Consults:  PCCM  Procedures:  Craniotomy 10/9 ETT 10/9 >  Significant Diagnostic Tests:  10/9 CT head > large left subdural hematoma with marked mass effect and resultant subfalcine and early transtentorial herniation, midline shift 10/9 CT head > interval left frontoparietal craniotomy for evacuation of large left convexity subdural hematoma, drain in place  Micro Data:  10/9 SARS CoV 2/flu > negative  Antimicrobials:  Ancef 10/8 x1  Interim history/subjective:  No acute events  Objective   Blood pressure 122/64, pulse 64, temperature (!) 97.2 F (36.2 C), temperature source Axillary, resp. rate 16, height 5\' 2"  (1.575 m), weight 49 kg, SpO2 100 %.    Vent Mode: PRVC FiO2 (%):  [30 %] (P) 30 % Set Rate:  [16 bmp] 16 bmp Vt Set:  [400 mL] 400 mL PEEP:  [5 cmH20] 5 cmH20 Plateau Pressure:  [9 cmH20-15 cmH20] 15 cmH20   Intake/Output Summary (Last 24 hours) at 04/17/2020 0749 Last data filed at 04/17/2020 0700 Gross per 24 hour  Intake 979.33 ml  Output 600 ml  Net 379.33 ml   Filed Weights   03/31/2020 0300  Weight: 49 kg    Examination:  General:  In bed on vent HENT: NCAT ETT in place PULM: CTA B, vent supported breathing CV: RRR, no mgr GI: BS+, soft, nontender MSK: normal bulk and tone Neuro: eye opening to voice, follows some commands   Resolved Hospital Problem list     Assessment & Plan:  Large subdural hematoma s/p craniotomy with secondary respiratory failure Flaccid  left side, inability to protect airway Will not make a meaningful neurology recovery (ie ability to live independently) Son feels she would not want to live this way Continue supportive care  Wound care per NSGY  Acute respiratory failure with hypoxemia due to inability to protect airway Full mechanical vent support VAP prevention Daily WUA/SBT Pressure support ventilation as tolerated  Goals of care: she has previously expressed to her family that she would not want to live this way after severe brain injury.    At this point we are causing more harm that good.  I believe we are perpetuating suffering and the appropriate next step would be to make her comfortable.  Her son states that he is trying to come from [place] the [place] he could come would be in another week.  I don't believe it is appropriate to make her wait that long given the already prolonged nature of this hospitalization.    Best practice:  Diet: tube feeding Pain/Anxiety/Delirium protocol (if indicated): holding sedation VAP protocol (if indicated): n/a DVT prophylaxis: scd GI prophylaxis: n/a Glucose control: SSI Mobility: bed rest Code Status: full Family Communication: I spoke to [place] her son and informed him that her condition hasn't changed, the soonest he can come is next week. Disposition:   Labs   CBC: Recent Labs  Lab 04/17/20 0101  WBC 8.5  HGB 9.2*  HCT 29.3*  MCV 97.3  PLT 378    Basic Metabolic Panel: Recent Labs  Lab 04/17/20  0101  NA 148*  K 3.6  CL 111  CO2 25  GLUCOSE 124*  BUN 36*  CREATININE 0.69  CALCIUM 8.8*  MG 2.3  PHOS 3.6   GFR: Estimated Creatinine Clearance: 39 mL/min (by C-G formula based on SCr of 0.69 mg/dL). Recent Labs  Lab 04/17/20 0101  WBC 8.5    Liver Function Tests: No results for input(s): AST, ALT, ALKPHOS, BILITOT, PROT, ALBUMIN in the last 168 hours. No results for input(s): LIPASE, AMYLASE in the last 168 hours. No results for input(s):  AMMONIA in the last 168 hours.  ABG    Component Value Date/Time   PHART 7.466 (H) 04/09/2020 0749   PCO2ART 35.1 04/09/2020 0749   PO2ART 229 (H) 04/09/2020 0749   HCO3 25.3 04/09/2020 0749   TCO2 26 04/09/2020 0749   O2SAT 100.0 04/09/2020 0749     Coagulation Profile: Recent Labs  Lab 04/11/20 0426  INR 1.0    Cardiac Enzymes: No results for input(s): CKTOTAL, CKMB, CKMBINDEX, TROPONINI in the last 168 hours.  HbA1C: Hgb A1c MFr Bld  Date/Time Value Ref Range Status  09/18/2015 10:39 AM 5.7 (H) <5.7 % Final    Comment:                                                                           According to the ADA Clinical Practice Recommendations for 2011, when HbA1c is used as a screening test:     >=6.5%   Diagnostic of Diabetes Mellitus            (if abnormal result is confirmed)   5.7-6.4%   Increased risk of developing Diabetes Mellitus   References:Diagnosis and Classification of Diabetes Mellitus,Diabetes QAES,9753,00(FRTMY 1):S62-S69 and Standards of Medical Care in         Diabetes - 2011,Diabetes TRZN,3567,01 (Suppl 1):S11-S61.       CBG: No results for input(s): GLUCAP in the last 168 hours.   Critical care time: 35 minutes     Roselie Awkward, MD [facility] Pager: [phone] Cell: [phone] If no response, call [phone]

## 2020-04-17 NOTE — Procedures (Signed)
Extubation Procedure Note  Patient Details:   Name: Cynthia Vargas DOB: Feb 22, 1932 MRN: [id number]   Airway Documentation:  Airway 7.5 mm (Active)  Secured at (cm) 24 cm 04/17/20 2015  Measured From Lips 04/17/20 2015  Secured Location Right 04/17/20 2015  Secured By [employer] 04/17/20 2015  Tube Holder Repositioned Yes 04/17/20 2015  Cuff Pressure (cm H2O) 26 cm H2O 04/17/20 2015  Site Condition Cool;Dry 04/17/20 1450   Vent end date: 04/17/20 Vent end time: 2306   Evaluation  O2 sats: currently acceptable Complications: No apparent complications Patient did tolerate procedure well. Bilateral Breath Sounds: Clear, Diminished   No  Patient terminally extubated per order after video visit with son. Patients mouth suctioned and mouth care performed by RN.  Ventilator removed from room.  Wyman Songster [facility] 04/17/2020, 11:08 PM

## 2020-04-17 NOTE — Progress Notes (Signed)
Nutrition Follow-up  DOCUMENTATION CODES:   Non-severe (moderate) malnutrition in context of chronic illness  INTERVENTION:   -Continue - Vital AF 1.2 @ 40 ml/hr (960 ml/day) via OG tube  Tube feeding regimen provides 1152 kcal, 72 grams of protein, and 779 ml of H2O.   NUTRITION DIAGNOSIS:   Moderate Malnutrition related to chronic illness as evidenced by moderate fat depletion, severe muscle depletion.  Ongoing  GOAL:   Patient will meet greater than or equal to 90% of their needs  Met with TF  MONITOR:   Vent status, Labs, Weight trends, Skin, I & O's  REASON FOR ASSESSMENT:   Ventilator    ASSESSMENT:   86 year old female who presented to the ED on 10/09 with AMS. PMH of HLD, CAD, GERD, atrial fibrillation. Pt intubated in the ED and taken emergently to the OR for left craniotomy for evacuation of SDH.  Patient is currently intubated on ventilator support MV: 6.4 L/min Temp (24hrs), Avg:98.2 F (36.8 C), Min:97.2 F (36.2 C), Max:99.1 F (37.3 C)  Reviewed I/O's: +379 ml x 24 hours and +2.8 L since admission  UOP: 600 ml x 24 hours  OGT: Vital AF 1.2 @ 40 ml/hr (provides 1152 kcals, 72 grams protein, and 779 ml free water daily).   Per MD notes, plan for terminal extubation today.   Labs reviewed: Na: 148.   Diet Order:   Diet Order    None      EDUCATION NEEDS:   No education needs have been identified at this time  Skin:  Skin Assessment: Skin Integrity Issues: Skin Integrity Issues:: Stage I Stage I: lt heel Incisions: left head  Last BM:  Unknown  Height:   Ht Readings from Last 1 Encounters:  04/14/2020 5' 2"  (1.575 m)    Weight:   Wt Readings from Last 1 Encounters:  04/22/2020 49 kg    Ideal Body Weight:  50 kg  BMI:  Body mass index is 19.76 kg/m.  Estimated Nutritional Needs:   Kcal:  1070  Protein:  65-75 grams  Fluid:  1.3 L/day    Loistine Chance, RD, LDN, [place] Registered Dietitian II Certified Diabetes Care  and Education Specialist Please refer to [facility] for RD and/or RD on-call/weekend/after hours pager

## 2020-04-17 NOTE — Progress Notes (Signed)
[facility] PCCM Evening Rounds  Family updated by NSGY and they now agree to withdraw care after a video visit.  Plan will be to perform one way extubation after son has seen her in a video visit.  That is still pending.  Roselie Awkward, MD [place] [facility] Pager: [phone] Cell: [phone] If no response, call [phone]

## 2020-04-17 NOTE — Progress Notes (Signed)
Neurosurgery Service Progress Note  Subjective: No acute events overnight.   Objective: Vitals:   04/17/20 1000 04/17/20 1200 04/17/20 1203 04/17/20 1300  BP: (!) 101/58 108/67  115/70  Pulse: 66 69  71  Resp: _0 Temp:  98.3 F (36.8 C)    TempSrc:  Axillary    SpO2: 100% 99% 99% 100%  Weight:      Height:       Temp (24hrs), Avg:98.2 F (36.8 C), Min:97.2 F (36.2 C), Max:99.1 F (37.3 C)  CBC Latest Ref Rng & Units 04/17/2020 04/09/2020 04/09/2020  WBC 4.0 - 10.5 K/uL 8.5 10.2 -  Hemoglobin 12.0 - 15.0 g/dL 9.2(L) 10.7(L) 10.2(L)  Hematocrit 36 - 46 % 29.3(L) 32.0(L) 30.0(L)  Platelets 150 - 400 K/uL 373 164 -   BMP Latest Ref Rng & Units 04/17/2020 04/10/2020 04/09/2020  Glucose 70 - 99 mg/dL 124(H) 111(H) 112(H)  BUN 8 - 23 mg/dL 36(H) 17 10  Creatinine 0.44 - 1.00 mg/dL 0.69 0.94 0.74  BUN/Creat Ratio 12 - 28 - - -  Sodium 135 - 145 mmol/L 148(H) 134(L) 136  Potassium 3.5 - 5.1 mmol/L 3.6 3.8 3.4(L)  Chloride 98 - 111 mmol/L 111 104 104  CO2 22 - 32 mmol/L 25 20(L) 23  Calcium 8.9 - 10.3 mg/dL 8.8(L) 8.4(L) 8.5(L)    Intake/Output Summary (Last 24 hours) at 04/17/2020 1308 Last data filed at 04/17/2020 1200 Gross per 24 hour  Intake 950 ml  Output 600 ml  Net 350 ml    Current Facility-Administered Medications:  .  acetaminophen (TYLENOL) tablet 650 mg, 650 mg, Per Tube, Q4H PRN **OR** acetaminophen (TYLENOL) suppository 650 mg, 650 mg, Rectal, Q4H PRN, Judith Part, MD .  amiodarone (PACERONE) tablet 200 mg, 200 mg, Per Tube, Daily, Judith Part, MD, 200 mg at 04/17/20 1043 .  atorvastatin (LIPITOR) tablet 20 mg, 20 mg, Per Tube, q1800, Judith Part, MD, 20 mg at 04/16/20 1840 .  chlorhexidine gluconate (MEDLINE KIT) (PERIDEX) 0.12 % solution 15 mL, 15 mL, Mouth Rinse, BID, [name] [name], Joyice Faster, MD, 15 mL at 04/17/20 0836 .  Chlorhexidine Gluconate Cloth 2 % PADS 6 each, 6 each, Topical, Daily, Judith Part, MD, 6 each at  04/16/20 2132 .  diltiazem (CARDIZEM) tablet 30 mg, 30 mg, Oral, BID PRN, Judith Part, MD .  docusate (COLACE) 50 MG/5ML liquid 100 mg, 100 mg, Per Tube, BID, Judith Part, MD, 100 mg at 04/17/20 1043 .  feeding supplement (VITAL AF 1.2 CAL) liquid 1,000 mL, 1,000 mL, Per Tube, Continuous, Gleason, Otilio Carpen, PA-C, Last Rate: 40 mL/hr at 04/17/20 1246, 1,000 mL at 04/17/20 1246 .  heparin injection 5,000 Units, 5,000 Units, Subcutaneous, Q8H, Judith Part, MD, 5,000 Units at 04/17/20 0529 .  HYDROcodone-acetaminophen (NORCO/VICODIN) 5-325 MG per tablet 1 tablet, 1 tablet, Per Tube, Q4H PRN, Judith Part, MD .  HYDROmorphone (DILAUDID) injection 0.5 mg, 0.5 mg, Intravenous, Q3H PRN, Judith Part, MD .  labetalol (NORMODYNE) injection 10-40 mg, 10-40 mg, Intravenous, Q10 min PRN, Judith Part, MD, 20 mg at 04/09/20 2238 .  MEDLINE mouth rinse, 15 mL, Mouth Rinse, 10 times per day, Judith Part, MD, 15 mL at 04/17/20 1246 .  ondansetron (ZOFRAN) tablet 4 mg, 4 mg, Per Tube, Q4H PRN **OR** ondansetron (ZOFRAN) injection 4 mg, 4 mg, Intravenous, Q4H PRN, Dariela Stoker A, MD .  polyethylene glycol (MIRALAX / GLYCOLAX) packet 17 g, 17 g,  Per Tube, Daily PRN, Judith Part, MD .  promethazine (PHENERGAN) tablet 12.5-25 mg, 12.5-25 mg, Oral, Q4H PRN, Judith Part, MD .  propofol (DIPRIVAN) 1000 MG/100ML infusion, 5-80 mcg/kg/min, Intravenous, Continuous, Ripley Fraise, MD, Stopped at 04/22/2020 1700   Physical Exam: Intubation, no sedation, L eye closed to voice R, eye minimally opens to voice, follow commands in RLE and RUE,  Flaccid L side and increased tone Incision c/d/i  Assessment & Plan: 86 y.o. female w/ large SDH and herniation with blown pupil on arrival s/p L craniotomy for evacuation. Post-op CTH with good evacuation, 10/13 SDD d/c'd  -CCM recs, pt is DNR, pt's son likely will be another week, I agree that we are artificially  prolonging her life in a state that she would not want to be in. Will inform the patient's son that we should extubate her today. She is FC, but does look frail, can see how she does with extubation before deciding on starting comfort measures, but I am pessimistic.   For future use, Guadelupe Sabin (patient's son) lives in [place] and can be reached at:  Home: [phone]  Mobile: [phone]   Judith Part, MD 04/17/20 1:08 PM   Addendum: I spoke with the son just now. He is doing his best to get here, but due to visa / passport issues it will be at least another week. We discussed the above and he agreed that he does not want to artificially extend her life in a state that she would not want to be in. He is okay with terminal extubation today, but would like to see her on video or at least be able to speak to her on speakerphone (he knows she can't respond due to the ETT). Afterwards, will proceed with compassionate / one-way extubation.

## 2020-04-17 NOTE — Progress Notes (Signed)
Called and left message for son to call with his email so we could set up his videochat to see his mother before one way extubation.  Will follow up with night shift nurse if no return phone call.  Hiram Gash RN

## 2020-04-17 NOTE — Progress Notes (Signed)
[facility] Progress Note Patient Name: Cynthia Vargas DOB: Jan 20, 1932 MRN: [id number]   Date of Service  04/17/2020  HPI/Events of Note  [place] asking for comfort care. Son from [place] did a video call. Notes reviewed.  eICU Interventions  Post video call: Comfort care as planned. Extubate. Comfort care meds ordered.  DNR.      Intervention Category Intermediate Interventions: Other:  Elmer Sow 04/17/2020, 10:17 PM

## 2020-04-17 NOTE — Progress Notes (Signed)
Assisted video call with patients son D[place]ill [place] and granddaughter in [place]. Family was able to say their goodbyes and are agreeable to transition patient to comfort care. Have notified MD for terminal extubation orders.

## 2020-04-17 NOTE — Progress Notes (Signed)
PT Cancellation Note  Patient Details Name: Cynthia Vargas MRN: [id number] DOB: 04-Oct-1931   Cancelled Treatment:    Reason Eval/Treat Not Completed: Other (comment). Pt transitioning to comfort care, awaiting of arrival of son from [place] prior to withdrawing care. Pt with no further Acute PT needs at this time. PT signing off. Please re-consult if needed in future.  Kittie Plater, PT, DPT Acute Rehabilitation Services Pager #: [phone] Office #: [phone]    Berline Lopes 04/17/2020, 7:55 AM

## 2020-04-18 NOTE — Progress Notes (Signed)
Patient received from [place]. Patient sleeping upon entering the room, opened her right eye to voice, no verbal response.  Vitals taken, assessment done. Patient sound asleep throughout whole exam.  Bed alarm placed.

## 2020-04-18 NOTE — Progress Notes (Signed)
[facility] PCCM  She was compassionately extubated overnight Not on any sedation infusions but remains drowsy Breathing comfortably but probably not protecting airway Would consider transitioning to full comfort measures later today if not more awake, alert  PCCM available PRN  Roselie Awkward, MD [place] [facility] Pager: [phone] Cell: [phone] If no response, call [phone]

## 2020-04-18 NOTE — Progress Notes (Signed)
Neurosurgery Service Progress Note  Subjective: One way / compassionately extubated last night after family from [place] visited via video, had some upper airway secretions that responded to glycopyrrolate  Objective: Vitals:   04/18/20 0400 04/18/20 0500 04/18/20 0600 04/18/20 0700  BP:      Pulse: 94 96 99 88  Resp: 20 (!) 22 (!) 22 16  Temp:      TempSrc:      SpO2: 93% 95% 94% 93%  Weight:      Height:       Temp (24hrs), Avg:98.2 F (36.8 C), Min:98 F (36.7 C), Max:98.3 F (36.8 C)  CBC Latest Ref Rng & Units 04/17/2020 04/09/2020 04/09/2020  WBC 4.0 - 10.5 K/uL 8.5 10.2 -  Hemoglobin 12.0 - 15.0 g/dL 9.2(L) 10.7(L) 10.2(L)  Hematocrit 36 - 46 % 29.3(L) 32.0(L) 30.0(L)  Platelets 150 - 400 K/uL 373 164 -   BMP Latest Ref Rng & Units 04/17/2020 04/10/2020 04/09/2020  Glucose 70 - 99 mg/dL 124(H) 111(H) 112(H)  BUN 8 - 23 mg/dL 36(H) 17 10  Creatinine 0.44 - 1.00 mg/dL 0.69 0.94 0.74  BUN/Creat Ratio 12 - 28 - - -  Sodium 135 - 145 mmol/L 148(H) 134(L) 136  Potassium 3.5 - 5.1 mmol/L 3.6 3.8 3.4(L)  Chloride 98 - 111 mmol/L 111 104 104  CO2 22 - 32 mmol/L 25 20(L) 23  Calcium 8.9 - 10.3 mg/dL 8.8(L) 8.4(L) 8.5(L)    Intake/Output Summary (Last 24 hours) at 04/18/2020 0272 Last data filed at 04/18/2020 0400 Gross per 24 hour  Intake 550 ml  Output 600 ml  Net -50 ml    Current Facility-Administered Medications:  .  acetaminophen (TYLENOL) tablet 650 mg, 650 mg, Oral, Q6H PRN **OR** acetaminophen (TYLENOL) suppository 650 mg, 650 mg, Rectal, Q6H PRN, Prudencio Burly, Kinila T, MD .  antiseptic oral rinse (BIOTENE) solution 15 mL, 15 mL, Topical, PRN, Prudencio Burly, Kinila T, MD .  chlorhexidine gluconate (MEDLINE KIT) (PERIDEX) 0.12 % solution 15 mL, 15 mL, Mouth Rinse, BID, Lourie Retz, Joyice Faster, MD, 15 mL at 04/17/20 2000 .  glycopyrrolate (ROBINUL) tablet 1 mg, 1 mg, Oral, Q4H PRN **OR** glycopyrrolate (ROBINUL) injection 0.2 mg, 0.2 mg, Subcutaneous, Q4H PRN **OR** glycopyrrolate  (ROBINUL) injection 0.2 mg, 0.2 mg, Intravenous, Q4H PRN, Cecilie Lowers T, MD, 0.2 mg at 04/18/20 0100 .  HYDROcodone-acetaminophen (NORCO/VICODIN) 5-325 MG per tablet 1 tablet, 1 tablet, Per Tube, Q4H PRN, Judith Part, MD .  HYDROmorphone (DILAUDID) injection 0.5 mg, 0.5 mg, Intravenous, Q3H PRN, Judith Part, MD .  LORazepam (ATIVAN) tablet 1 mg, 1 mg, Oral, Q4H PRN **OR** [DISCONTINUED] LORazepam (ATIVAN) 2 MG/ML concentrated solution 1 mg, 1 mg, Sublingual, Q4H PRN **OR** LORazepam (ATIVAN) injection 1 mg, 1 mg, Intravenous, Q4H PRN, Prudencio Burly, Kinila T, MD .  morphine 2 MG/ML injection 1 mg, 1 mg, Intravenous, Q2H PRN, Cecilie Lowers T, MD, 1 mg at 04/18/20 5366 .  ondansetron (ZOFRAN-ODT) disintegrating tablet 4 mg, 4 mg, Oral, Q6H PRN **OR** ondansetron (ZOFRAN) injection 4 mg, 4 mg, Intravenous, Q6H PRN, Prudencio Burly, Kinila T, MD .  polyvinyl alcohol (LIQUIFILM TEARS) 1.4 % ophthalmic solution 1 drop, 1 drop, Both Eyes, QID PRN, Prudencio Burly, Kinila T, MD .  promethazine (PHENERGAN) tablet 12.5-25 mg, 12.5-25 mg, Oral, Q4H PRN, Judith Part, MD   Physical Exam: Opens L eye to voice, not FC, flaccid L side and increased tone, appears comfortable Incision c/d/i  Assessment & Plan: 86 y.o. female w/ large SDH and herniation with  blown pupil on arrival s/p L craniotomy for evacuation. Post-op CTH with good evacuation, 10/13 SDD d/c'd  -can transfer out of the unit to 6N -comfort care measures, prn glyco for secretions, morphine prn   Judith Part, MD 04/18/20 9:37 AM

## 2020-04-19 ENCOUNTER — Telehealth: Payer: Self-pay

## 2020-04-19 NOTE — Progress Notes (Signed)
Neurosurgery Service Progress Note  Subjective: NAE ON  Objective: Vitals:   04/18/20 1955 04/19/20 0030 04/19/20 0453 04/19/20 0812  BP: 124/75 (!) 144/78 133/72 132/75  Pulse: 75 77 77 78  Resp: 18 18 18 19   Temp: 97.7 F (36.5 C) 99.1 F (37.3 C) (!) 97.5 F (36.4 C) 98 F (36.7 C)  TempSrc: Oral Oral Oral Axillary  SpO2: 98% 98% 98% 97%  Weight:      Height:       Temp (24hrs), Avg:98 F (36.7 C), Min:97.5 F (36.4 C), Max:99.1 F (37.3 C)  CBC Latest Ref Rng & Units 04/17/2020 04/09/2020 04/09/2020  WBC 4.0 - 10.5 K/uL 8.5 10.2 -  Hemoglobin 12.0 - 15.0 g/dL 9.2(L) 10.7(L) 10.2(L)  Hematocrit 36 - 46 % 29.3(L) 32.0(L) 30.0(L)  Platelets 150 - 400 K/uL 373 164 -   BMP Latest Ref Rng & Units 04/17/2020 04/10/2020 04/09/2020  Glucose 70 - 99 mg/dL 124(H) 111(H) 112(H)  BUN 8 - 23 mg/dL 36(H) 17 10  Creatinine 0.44 - 1.00 mg/dL 0.69 0.94 0.74  BUN/Creat Ratio 12 - 28 - - -  Sodium 135 - 145 mmol/L 148(H) 134(L) 136  Potassium 3.5 - 5.1 mmol/L 3.6 3.8 3.4(L)  Chloride 98 - 111 mmol/L 111 104 104  CO2 22 - 32 mmol/L 25 20(L) 23  Calcium 8.9 - 10.3 mg/dL 8.8(L) 8.4(L) 8.5(L)    Intake/Output Summary (Last 24 hours) at 04/19/2020 0847 Last data filed at 04/19/2020 0700 Gross per 24 hour  Intake --  Output 150 ml  Net -150 ml    Current Facility-Administered Medications:  .  acetaminophen (TYLENOL) tablet 650 mg, 650 mg, Oral, Q6H PRN **OR** acetaminophen (TYLENOL) suppository 650 mg, 650 mg, Rectal, Q6H PRN, Prudencio Burly, Kinila T, MD .  antiseptic oral rinse (BIOTENE) solution 15 mL, 15 mL, Topical, PRN, Prudencio Burly, Kinila T, MD .  chlorhexidine gluconate (MEDLINE KIT) (PERIDEX) 0.12 % solution 15 mL, 15 mL, Mouth Rinse, BID, Devonne Kitchen, Joyice Faster, MD, 15 mL at 04/18/20 2004 .  glycopyrrolate (ROBINUL) tablet 1 mg, 1 mg, Oral, Q4H PRN **OR** glycopyrrolate (ROBINUL) injection 0.2 mg, 0.2 mg, Subcutaneous, Q4H PRN **OR** glycopyrrolate (ROBINUL) injection 0.2 mg, 0.2 mg,  Intravenous, Q4H PRN, Cecilie Lowers T, MD, 0.2 mg at 04/18/20 0939 .  HYDROcodone-acetaminophen (NORCO/VICODIN) 5-325 MG per tablet 1 tablet, 1 tablet, Per Tube, Q4H PRN, Judith Part, MD .  HYDROmorphone (DILAUDID) injection 0.5 mg, 0.5 mg, Intravenous, Q3H PRN, Judith Part, MD .  LORazepam (ATIVAN) tablet 1 mg, 1 mg, Oral, Q4H PRN **OR** [DISCONTINUED] LORazepam (ATIVAN) 2 MG/ML concentrated solution 1 mg, 1 mg, Sublingual, Q4H PRN **OR** LORazepam (ATIVAN) injection 1 mg, 1 mg, Intravenous, Q4H PRN, Prudencio Burly, Kinila T, MD .  morphine 2 MG/ML injection 1 mg, 1 mg, Intravenous, Q2H PRN, Cecilie Lowers T, MD, 1 mg at 04/18/20 0938 .  ondansetron (ZOFRAN-ODT) disintegrating tablet 4 mg, 4 mg, Oral, Q6H PRN **OR** ondansetron (ZOFRAN) injection 4 mg, 4 mg, Intravenous, Q6H PRN, Prudencio Burly, Kinila T, MD .  polyvinyl alcohol (LIQUIFILM TEARS) 1.4 % ophthalmic solution 1 drop, 1 drop, Both Eyes, QID PRN, Prudencio Burly, Kinila T, MD .  promethazine (PHENERGAN) tablet 12.5-25 mg, 12.5-25 mg, Oral, Q4H PRN, Judith Part, MD   Physical Exam: Opens L eye to voice, +FC on R, flaccid L side and increased tone, appears comfortable but gravely ill with mottled skin changes and bradypnea  Incision c/d/i  Assessment & Plan: 86 y.o. female w/ large SDH and herniation  with blown pupil on arrival s/p L craniotomy for evacuation. Post-op CTH with good evacuation, 10/13 SDD d/c'd, continued post-op L hemiplegia. Extensive family discussions, pt made DNR then terminally extubated 10/18.  -comfort care measures, prn glyco for secretions, morphine prn, call with any concerns regarding patient comfort  Judith Part, MD 04/19/20 8:47 AM

## 2020-04-19 NOTE — Discharge Summary (Signed)
Discharge Summary  Date of Admission: 04/16/2020  Date of Discharge: 05-13-20  Attending Physician: Emelda Brothers, MD  Hospital Course: Patient presented to the ED with severe headache the progressed to obtundation in the setting of warfarin. CTH showed very large L SDH, warfarin was reversed and she was emergently taken to the OR for evacuation. Post-operatively, she slowly regained the ability to follow commands on one side, but remained too weak to wean from the ventilator and had left hemiplegia that would be a poor functional outcome. This was discussed with her son, her only known remaining relative, who lives in [place] and was not able to visit due to visa/passport issues. He agreed that she would not want to remain alive with this sort of functional deficit, would not want tracheostomy / PEG placement at this age and functional scenario. We therefore agreed that she would want to made [facility] and terminally extubated with comfort measures only if she did not do well off the ventilator. One-way extubation was performed on 10/18 and she was not protecting her airway well with limited reserve, we therefore proceeded with comfort care measures only. She died on 2020-05-13 at 17:03.  Discharge diagnosis: Subdural hematoma  Judith Part, MD 05/13/2020 5:27 PM

## 2020-04-19 NOTE — Progress Notes (Signed)
Frequent rounding on patient. Mouth care done various times throughout the day. Heel protectors placed on patient and floated as well. Patient only responds to voice and only opens her right eye.  Patient asleep all day.  Will continue to round on patient and make sure she is comfortable as possible.

## 2020-04-19 NOTE — Consult Note (Signed)
   [facility] Inpatient Consult   04/19/2020  KELITA WALLIS 10/07/1931 [id number]   [facility] [AC[facility]] Patient:  Medicare NextGen  Chart reviewed for length of stay transfer from ICU 04/18/20 with high risk score and reveals the patient is currently transitioning to comfort care.   Plan: Completed review no current community needs since patient is comfort care.  Natividad Brood, RN BSN [facility] Liaison  [phone] business mobile phone Toll free office [phone]  Fax number: [phone] [email or web address]

## 2020-04-19 NOTE — Progress Notes (Signed)
Nutrition Brief Note  Chart reviewed. Pt now transitioning to comfort care.  No further nutrition interventions warranted at this time.  Please re-consult as needed.   Hence Derrick W, RD, LDN, CDCES Registered Dietitian II Certified Diabetes Care and Education Specialist Please refer to [facility] for RD and/or RD on-call/weekend/after hours pager  

## 2020-04-19 NOTE — Telephone Encounter (Signed)
Called Vaughan Basta, patients caretaker (DPR) who lives with the patient to inform her of the recent monitor results. Spoke with Vaughan Basta at length about the patients current condition. She states that the patient is hospitalized and had a recent craniotomy. The patient ended up being terminally extubated on 10/18 and is currently been placed on comfort care at [facility]. The patient is expected to expire in the next couple of days. Will send to Dr. Radford Pax as Juluis Rainier.

## 2020-04-19 NOTE — Plan of Care (Signed)

## 2020-04-19 NOTE — Telephone Encounter (Signed)
-----   Message from [place], PA-C sent at 04/19/2020  2:33 PM EDT ----- Please call and inform patient event monitor showed no recurrent afib. Heart was normal with varying rates.

## 2020-04-20 MED ORDER — ORAL CARE MOUTH RINSE
15.0000 mL | Freq: Two times a day (BID) | OROMUCOSAL | Status: DC
  Administered 2020-04-20 – 2020-04-23 (×6): 15 mL via OROMUCOSAL

## 2020-04-20 MED ORDER — CHLORHEXIDINE GLUCONATE 0.12 % MT SOLN
15.0000 mL | Freq: Two times a day (BID) | OROMUCOSAL | Status: DC
  Administered 2020-04-20 – 2020-04-23 (×7): 15 mL via OROMUCOSAL
  Filled 2020-04-20 (×5): qty 15

## 2020-04-20 NOTE — Care Management Important Message (Signed)
Important Message  Patient Details  Name: Cynthia Vargas MRN: [id number] Date of Birth: 1931/09/13   Medicare Important Message Given:  Yes     Orbie Pyo 04/20/2020, 2:26 PM

## 2020-04-20 NOTE — Progress Notes (Signed)
Neurosurgery Service Progress Note  Subjective: NAE ON  Objective: Vitals:   04/19/20 0453 04/19/20 0812 04/19/20 2116 04/20/20 0415  BP: 133/72 132/75 131/76 123/76  Pulse: 77 78 98 88  Resp: _0 Temp: (!) 97.5 F (36.4 C) 98 F (36.7 C) 97.8 F (36.6 C) 98.2 F (36.8 C)  TempSrc: Oral Axillary Oral Oral  SpO2: 98% 97% 93% 93%  Weight:      Height:       Temp (24hrs), Avg:98 F (36.7 C), Min:97.8 F (36.6 C), Max:98.2 F (36.8 C)  CBC Latest Ref Rng & Units 04/17/2020 04/09/2020 04/09/2020  WBC 4.0 - 10.5 K/uL 8.5 10.2 -  Hemoglobin 12.0 - 15.0 g/dL 9.2(L) 10.7(L) 10.2(L)  Hematocrit 36 - 46 % 29.3(L) 32.0(L) 30.0(L)  Platelets 150 - 400 K/uL 373 164 -   BMP Latest Ref Rng & Units 04/17/2020 04/10/2020 04/09/2020  Glucose 70 - 99 mg/dL 124(H) 111(H) 112(H)  BUN 8 - 23 mg/dL 36(H) 17 10  Creatinine 0.44 - 1.00 mg/dL 0.69 0.94 0.74  BUN/Creat Ratio 12 - 28 - - -  Sodium 135 - 145 mmol/L 148(H) 134(L) 136  Potassium 3.5 - 5.1 mmol/L 3.6 3.8 3.4(L)  Chloride 98 - 111 mmol/L 111 104 104  CO2 22 - 32 mmol/L 25 20(L) 23  Calcium 8.9 - 10.3 mg/dL 8.8(L) 8.4(L) 8.5(L)    Intake/Output Summary (Last 24 hours) at 04/20/2020 0908 Last data filed at 04/20/2020 0415 Gross per 24 hour  Intake --  Output 1000 ml  Net -1000 ml    Current Facility-Administered Medications:  .  acetaminophen (TYLENOL) tablet 650 mg, 650 mg, Oral, Q6H PRN **OR** acetaminophen (TYLENOL) suppository 650 mg, 650 mg, Rectal, Q6H PRN, Prudencio Burly, Kinila T, MD .  antiseptic oral rinse (BIOTENE) solution 15 mL, 15 mL, Topical, PRN, Prudencio Burly, Kinila T, MD .  chlorhexidine gluconate (MEDLINE KIT) (PERIDEX) 0.12 % solution 15 mL, 15 mL, Mouth Rinse, BID, Judith Part, MD, 15 mL at 04/19/20 2028 .  glycopyrrolate (ROBINUL) tablet 1 mg, 1 mg, Oral, Q4H PRN **OR** glycopyrrolate (ROBINUL) injection 0.2 mg, 0.2 mg, Subcutaneous, Q4H PRN **OR** glycopyrrolate (ROBINUL) injection 0.2 mg, 0.2 mg,  Intravenous, Q4H PRN, Cecilie Lowers T, MD, 0.2 mg at 04/19/20 1939 .  HYDROcodone-acetaminophen (NORCO/VICODIN) 5-325 MG per tablet 1 tablet, 1 tablet, Per Tube, Q4H PRN, Judith Part, MD .  HYDROmorphone (DILAUDID) injection 0.5 mg, 0.5 mg, Intravenous, Q3H PRN, Judith Part, MD .  LORazepam (ATIVAN) tablet 1 mg, 1 mg, Oral, Q4H PRN **OR** [DISCONTINUED] LORazepam (ATIVAN) 2 MG/ML concentrated solution 1 mg, 1 mg, Sublingual, Q4H PRN **OR** LORazepam (ATIVAN) injection 1 mg, 1 mg, Intravenous, Q4H PRN, Prudencio Burly, Kinila T, MD .  morphine 2 MG/ML injection 1 mg, 1 mg, Intravenous, Q2H PRN, Cecilie Lowers T, MD, 1 mg at 04/18/20 0938 .  ondansetron (ZOFRAN-ODT) disintegrating tablet 4 mg, 4 mg, Oral, Q6H PRN **OR** ondansetron (ZOFRAN) injection 4 mg, 4 mg, Intravenous, Q6H PRN, Prudencio Burly, Kinila T, MD .  polyvinyl alcohol (LIQUIFILM TEARS) 1.4 % ophthalmic solution 1 drop, 1 drop, Both Eyes, QID PRN, Elmer Sow, MD, 1 drop at 04/19/20 1119 .  promethazine (PHENERGAN) tablet 12.5-25 mg, 12.5-25 mg, Oral, Q4H PRN, Judith Part, MD   Physical Exam: Opens L eye to voice, tachypneic and tachycardic, appears comfortable but gravely ill with mottled skin changes Incision c/d/i  Assessment & Plan: 86 y.o. female w/ large SDH and herniation with blown pupil on arrival  s/p L craniotomy for evacuation. Post-op CTH with good evacuation, 10/13 SDD d/c'd, continued post-op L hemiplegia. Extensive family discussions, pt made DNR then terminally extubated 10/18.  -comfort care measures, prn glyco for secretions, morphine prn, call with any concerns regarding patient comfort  Thomas A Ostergard, MD 04/20/20 9:08 AM  

## 2020-04-21 NOTE — Progress Notes (Signed)
Neurosurgery Service Progress Note  Subjective: NAE ON  Objective: Vitals:   04/19/20 0812 04/19/20 2116 04/20/20 0415 04/21/20 0455  BP: 132/75 131/76 123/76 116/70  Pulse: 78 98 88 93  Resp: 19 20 20 20   Temp: 98 F (36.7 C) 97.8 F (36.6 C) 98.2 F (36.8 C) 99.2 F (37.3 C)  TempSrc: Axillary Oral Oral Oral  SpO2: 97% 93% 93% (!) 84%  Weight:      Height:       Temp (24hrs), Avg:99.2 F (37.3 C), Min:99.2 F (37.3 C), Max:99.2 F (37.3 C)  CBC Latest Ref Rng & Units 04/17/2020 04/09/2020 04/09/2020  WBC 4.0 - 10.5 K/uL 8.5 10.2 -  Hemoglobin 12.0 - 15.0 g/dL 9.2(L) 10.7(L) 10.2(L)  Hematocrit 36 - 46 % 29.3(L) 32.0(L) 30.0(L)  Platelets 150 - 400 K/uL 373 164 -   BMP Latest Ref Rng & Units 04/17/2020 04/10/2020 04/09/2020  Glucose 70 - 99 mg/dL 124(H) 111(H) 112(H)  BUN 8 - 23 mg/dL 36(H) 17 10  Creatinine 0.44 - 1.00 mg/dL 0.69 0.94 0.74  BUN/Creat Ratio 12 - 28 - - -  Sodium 135 - 145 mmol/L 148(H) 134(L) 136  Potassium 3.5 - 5.1 mmol/L 3.6 3.8 3.4(L)  Chloride 98 - 111 mmol/L 111 104 104  CO2 22 - 32 mmol/L 25 20(L) 23  Calcium 8.9 - 10.3 mg/dL 8.8(L) 8.4(L) 8.5(L)    Intake/Output Summary (Last 24 hours) at 04/21/2020 0825 Last data filed at 04/21/2020 0458 Gross per 24 hour  Intake 0 ml  Output 400 ml  Net -400 ml    Current Facility-Administered Medications:  .  acetaminophen (TYLENOL) tablet 650 mg, 650 mg, Oral, Q6H PRN **OR** acetaminophen (TYLENOL) suppository 650 mg, 650 mg, Rectal, Q6H PRN, Prudencio Burly, Kinila T, MD .  antiseptic oral rinse (BIOTENE) solution 15 mL, 15 mL, Topical, PRN, Prudencio Burly, Kinila T, MD .  chlorhexidine (PERIDEX) 0.12 % solution 15 mL, 15 mL, Mouth Rinse, BID, Amzie Sillas, Joyice Faster, MD, 15 mL at 04/20/20 2257 .  chlorhexidine gluconate (MEDLINE KIT) (PERIDEX) 0.12 % solution 15 mL, 15 mL, Mouth Rinse, BID, Tranisha Tissue, Joyice Faster, MD, 15 mL at 04/21/20 0812 .  glycopyrrolate (ROBINUL) tablet 1 mg, 1 mg, Oral, Q4H PRN **OR**  glycopyrrolate (ROBINUL) injection 0.2 mg, 0.2 mg, Subcutaneous, Q4H PRN **OR** glycopyrrolate (ROBINUL) injection 0.2 mg, 0.2 mg, Intravenous, Q4H PRN, Cecilie Lowers T, MD, 0.2 mg at 04/19/20 1939 .  HYDROcodone-acetaminophen (NORCO/VICODIN) 5-325 MG per tablet 1 tablet, 1 tablet, Per Tube, Q4H PRN, Judith Part, MD .  HYDROmorphone (DILAUDID) injection 0.5 mg, 0.5 mg, Intravenous, Q3H PRN, Mycheal Veldhuizen A, MD .  LORazepam (ATIVAN) tablet 1 mg, 1 mg, Oral, Q4H PRN **OR** [DISCONTINUED] LORazepam (ATIVAN) 2 MG/ML concentrated solution 1 mg, 1 mg, Sublingual, Q4H PRN **OR** LORazepam (ATIVAN) injection 1 mg, 1 mg, Intravenous, Q4H PRN, Cecilie Lowers T, MD .  MEDLINE mouth rinse, 15 mL, Mouth Rinse, q12n4p, Judith Part, MD, 15 mL at 04/20/20 1822 .  morphine 2 MG/ML injection 1 mg, 1 mg, Intravenous, Q2H PRN, Cecilie Lowers T, MD, 1 mg at 04/20/20 1402 .  ondansetron (ZOFRAN-ODT) disintegrating tablet 4 mg, 4 mg, Oral, Q6H PRN **OR** ondansetron (ZOFRAN) injection 4 mg, 4 mg, Intravenous, Q6H PRN, Prudencio Burly, Kinila T, MD .  polyvinyl alcohol (LIQUIFILM TEARS) 1.4 % ophthalmic solution 1 drop, 1 drop, Both Eyes, QID PRN, Elmer Sow, MD, 1 drop at 04/19/20 1119 .  promethazine (PHENERGAN) tablet 12.5-25 mg, 12.5-25 mg, Oral, Q4H PRN, Zimir Kittleson,  Joyice Faster, MD   Physical Exam: Not opening eyes to voice or FC, tachypneic and tachycardic, appears comfortable but gravely ill with mottled skin changes Incision c/d/i  Assessment & Plan: 86 y.o. female w/ large SDH and herniation with blown pupil on arrival s/p L craniotomy for evacuation. Post-op CTH with good evacuation, 10/13 SDD d/c'd, continued post-op L hemiplegia. Extensive family discussions, pt made DNR then terminally extubated 10/18.  -comfort care measures, prn glyco for secretions, morphine prn, call with any concerns regarding patient comfort -no longer awakening, was going to discuss hospice transfer but I think she is  entering the final stages of dying and will likely die as an inpatient  Judith Part, MD 04/21/20 8:25 AM

## 2020-04-22 NOTE — Plan of Care (Signed)
Pt is DNR and unresponsive.

## 2020-04-22 NOTE — Progress Notes (Signed)
Neurosurgery Service Progress Note  Subjective: NAE ON  Objective: Vitals:   04/20/20 0415 04/21/20 0455 04/21/20 1532 04/22/20 0505  BP: 123/76 116/70 111/70 110/65  Pulse: 88 93 95 90  Resp: 20 20 (!) 28 (!) 25  Temp: 98.2 F (36.8 C) 99.2 F (37.3 C) 99.8 F (37.7 C) 98.3 F (36.8 C)  TempSrc: Oral Oral  Axillary  SpO2: 93% (!) 84% (!) 85% (!) 86%  Weight:      Height:       Temp (24hrs), Avg:99.1 F (37.3 C), Min:98.3 F (36.8 C), Max:99.8 F (37.7 C)  CBC Latest Ref Rng & Units 04/17/2020 04/09/2020 04/09/2020  WBC 4.0 - 10.5 K/uL 8.5 10.2 -  Hemoglobin 12.0 - 15.0 g/dL 9.2(L) 10.7(L) 10.2(L)  Hematocrit 36 - 46 % 29.3(L) 32.0(L) 30.0(L)  Platelets 150 - 400 K/uL 373 164 -   BMP Latest Ref Rng & Units 04/17/2020 04/10/2020 04/09/2020  Glucose 70 - 99 mg/dL 124(H) 111(H) 112(H)  BUN 8 - 23 mg/dL 36(H) 17 10  Creatinine 0.44 - 1.00 mg/dL 0.69 0.94 0.74  BUN/Creat Ratio 12 - 28 - - -  Sodium 135 - 145 mmol/L 148(H) 134(L) 136  Potassium 3.5 - 5.1 mmol/L 3.6 3.8 3.4(L)  Chloride 98 - 111 mmol/L 111 104 104  CO2 22 - 32 mmol/L 25 20(L) 23  Calcium 8.9 - 10.3 mg/dL 8.8(L) 8.4(L) 8.5(L)    Intake/Output Summary (Last 24 hours) at 04/22/2020 0851 Last data filed at 04/22/2020 0505 Gross per 24 hour  Intake 0 ml  Output 50 ml  Net -50 ml    Current Facility-Administered Medications:  .  acetaminophen (TYLENOL) tablet 650 mg, 650 mg, Oral, Q6H PRN **OR** acetaminophen (TYLENOL) suppository 650 mg, 650 mg, Rectal, Q6H PRN, Prudencio Burly, Kinila T, MD .  antiseptic oral rinse (BIOTENE) solution 15 mL, 15 mL, Topical, PRN, Prudencio Burly, Kinila T, MD .  chlorhexidine (PERIDEX) 0.12 % solution 15 mL, 15 mL, Mouth Rinse, BID, Javarian Jakubiak, Joyice Faster, MD, 15 mL at 04/21/20 2142 .  chlorhexidine gluconate (MEDLINE KIT) (PERIDEX) 0.12 % solution 15 mL, 15 mL, Mouth Rinse, BID, Deshannon Seide, Joyice Faster, MD, 15 mL at 04/21/20 1958 .  glycopyrrolate (ROBINUL) tablet 1 mg, 1 mg, Oral, Q4H PRN **OR**  glycopyrrolate (ROBINUL) injection 0.2 mg, 0.2 mg, Subcutaneous, Q4H PRN **OR** glycopyrrolate (ROBINUL) injection 0.2 mg, 0.2 mg, Intravenous, Q4H PRN, Cecilie Lowers T, MD, 0.2 mg at 04/21/20 1040 .  HYDROcodone-acetaminophen (NORCO/VICODIN) 5-325 MG per tablet 1 tablet, 1 tablet, Per Tube, Q4H PRN, Judith Part, MD .  HYDROmorphone (DILAUDID) injection 0.5 mg, 0.5 mg, Intravenous, Q3H PRN, Judith Part, MD, 0.5 mg at 04/21/20 1040 .  LORazepam (ATIVAN) tablet 1 mg, 1 mg, Oral, Q4H PRN **OR** [DISCONTINUED] LORazepam (ATIVAN) 2 MG/ML concentrated solution 1 mg, 1 mg, Sublingual, Q4H PRN **OR** LORazepam (ATIVAN) injection 1 mg, 1 mg, Intravenous, Q4H PRN, Cecilie Lowers T, MD .  MEDLINE mouth rinse, 15 mL, Mouth Rinse, q12n4p, Shawneen Deetz, Joyice Faster, MD, 15 mL at 04/21/20 1559 .  morphine 2 MG/ML injection 1 mg, 1 mg, Intravenous, Q2H PRN, Cecilie Lowers T, MD, 1 mg at 04/21/20 1958 .  ondansetron (ZOFRAN-ODT) disintegrating tablet 4 mg, 4 mg, Oral, Q6H PRN **OR** ondansetron (ZOFRAN) injection 4 mg, 4 mg, Intravenous, Q6H PRN, Prudencio Burly, Kinila T, MD .  polyvinyl alcohol (LIQUIFILM TEARS) 1.4 % ophthalmic solution 1 drop, 1 drop, Both Eyes, QID PRN, Elmer Sow, MD, 1 drop at 04/19/20 1119 .  promethazine (PHENERGAN)  tablet 12.5-25 mg, 12.5-25 mg, Oral, Q4H PRN, Judith Part, MD   Physical Exam: Not opening eyes to voice or FC, tachypneic and tachycardic, appears comfortable but gravely ill  Assessment & Plan: 86 y.o. female w/ large SDH and herniation with blown pupil on arrival s/p L craniotomy for evacuation. Post-op CTH with good evacuation, 10/13 SDD d/c'd, continued post-op L hemiplegia. Extensive family discussions, pt made DNR then terminally extubated 10/18.  -comfort care measures, prn glyco for secretions, morphine prn, call with any concerns regarding patient comfort  Judith Part, MD 04/22/20 8:51 AM

## 2020-05-01 NOTE — Plan of Care (Signed)
°  Problem: Education: Goal: Knowledge of General Education information will improve Description: Including pain rating scale, medication(s)/side effects and non-pharmacologic comfort measures Outcome: Not Progressing   Problem: Health Behavior/Discharge Planning: Goal: Ability to manage health-related needs will improve Outcome: Not Progressing   Problem: Clinical Measurements: Goal: Ability to maintain clinical measurements within normal limits will improve Outcome: Not Progressing Goal: Will remain free from infection Outcome: Not Progressing Goal: Diagnostic test results will improve Outcome: Not Progressing Goal: Respiratory complications will improve Outcome: Not Progressing Goal: Cardiovascular complication will be avoided Outcome: Not Progressing   Problem: Activity: Goal: Risk for activity intolerance will decrease Outcome: Not Progressing   Problem: Nutrition: Goal: Adequate nutrition will be maintained Outcome: Not Progressing   Problem: Coping: Goal: Level of anxiety will decrease Outcome: Not Progressing   Problem: Elimination: Goal: Will not experience complications related to bowel motility Outcome: Not Progressing Goal: Will not experience complications related to urinary retention Outcome: Not Progressing   Problem: Pain Managment: Goal: General experience of comfort will improve Outcome: Not Progressing   Problem: Safety: Goal: Ability to remain free from injury will improve Outcome: Not Progressing   Problem: Skin Integrity: Goal: Risk for impaired skin integrity will decrease Outcome: Not Progressing   Problem: Activity: Goal: Ability to tolerate increased activity will improve Outcome: Not Progressing   Problem: Respiratory: Goal: Ability to maintain a clear airway and adequate ventilation will improve Outcome: Not Progressing   Problem: Role Relationship: Goal: Method of communication will improve Outcome: Not Progressing   Problem:  Education: Goal: Knowledge of the prescribed therapeutic regimen will improve Outcome: Not Progressing   Problem: Coping: Goal: Ability to identify and develop effective coping behavior will improve Outcome: Not Progressing   Problem: Clinical Measurements: Goal: Quality of life will improve Outcome: Not Progressing   Problem: Respiratory: Goal: Verbalizations of increased ease of respirations will increase Outcome: Not Progressing   Problem: Role Relationship: Goal: Family's ability to cope with current situation will improve Outcome: Not Progressing Goal: Ability to verbalize concerns, feelings, and thoughts to partner or family member will improve Outcome: Not Progressing   Problem: Pain Management: Goal: Satisfaction with pain management regimen will improve Outcome: Not Progressing

## 2020-05-01 NOTE — Progress Notes (Signed)
Granddaughter, Lyndsy Gilberto, notified of grandmother's passing; stated she would notify her cousins and she would call back in 30 minutes.

## 2020-05-01 NOTE — Progress Notes (Signed)
Patient passed during patient care. Charge nurse notified. Dr. Emelda Brothers paged.  Awaiting call back

## 2020-05-01 NOTE — Progress Notes (Signed)
Patient ID: Cynthia Vargas, female   DOB: June 10, 1932, 86 y.o.   MRN: [id number] Patient remains unresponsive and on comfort care.

## 2020-05-01 DEATH — deceased

## 2020-05-12 ENCOUNTER — Ambulatory Visit: Payer: BLUE CROSS/BLUE SHIELD | Admitting: Internal Medicine

## 2020-06-12 ENCOUNTER — Telehealth: Payer: Medicare Other
